# Patient Record
Sex: Female | Born: 1995 | Race: Black or African American | Hispanic: No | Marital: Single | Smoking: Never smoker
Health system: Southern US, Community
[De-identification: ages and names within clinical notes are randomized; demographics above are authoritative.]

## PROBLEM LIST (undated history)

## (undated) ENCOUNTER — Inpatient Hospital Stay (HOSPITAL_COMMUNITY): Payer: Self-pay

## (undated) DIAGNOSIS — Q8501 Neurofibromatosis, type 1: Secondary | ICD-10-CM

## (undated) HISTORY — DX: Neurofibromatosis, type 1: Q85.01

## (undated) HISTORY — PX: NO PAST SURGERIES: SHX2092

## (undated) HISTORY — PX: WISDOM TOOTH EXTRACTION: SHX21

---
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
## 2014-01-29 ENCOUNTER — Encounter: Payer: Self-pay | Admitting: Pediatrics

## 2014-01-29 ENCOUNTER — Ambulatory Visit (INDEPENDENT_AMBULATORY_CARE_PROVIDER_SITE_OTHER): Payer: Medicaid Other | Admitting: Pediatrics

## 2014-01-29 VITALS — BP 118/64 | Wt 94.2 lb

## 2014-01-29 DIAGNOSIS — Z3041 Encounter for surveillance of contraceptive pills: Secondary | ICD-10-CM

## 2014-01-29 DIAGNOSIS — Z23 Encounter for immunization: Secondary | ICD-10-CM

## 2014-01-29 DIAGNOSIS — Z3202 Encounter for pregnancy test, result negative: Secondary | ICD-10-CM

## 2014-01-29 LAB — POCT URINE PREGNANCY: Preg Test, Ur: NEGATIVE

## 2014-01-29 NOTE — Progress Notes (Signed)
History was provided by the patient.  Danielle Bradley is a 18 y.o. female who is here for follow up after starting OCPs.  Since starting approximately 2 months ago Danielle Bradley has noticed she has had a small increase in the frequency with which she gets headaches.  She has not had any mood swings, nausea, upset stomach.  Her periods have been normal without intermittent bleeding, she had worse cramps with her last period than normal.  She has not been sexually active, and does not smoke.  She sets an alarm in her phone to remind her to take her pill, hasn't missed thus far. Planning on starting some classes at [employer] then transferring to [employer] for education     The following portions of the patient's history were reviewed and updated as appropriate: allergies, current medications, past family history, past medical history, past social history, past surgical history and problem list.  Physical Exam:  BP 118/64  Wt 94 lb 3.2 oz (42.729 kg)    General:   alert and no distress     Skin:   innumerable freckles on face and arms  Eyes:   sclerae white  Nose: clear, no discharge  Lungs:  clear to auscultation bilaterally  Heart:   regular rate and rhythm, S1, S2 normal, no murmur, click, rub or gallop   Abdomen:  soft, non-tender; bowel sounds normal; no masses,  no organomegaly  Extremities:   extremities normal, atraumatic, no cyanosis or edema  Neuro:  normal without focal findings and muscle tone and strength normal and symmetric    Assessment/Plan:  1. Encounter for surveillance of contraceptive pills Currently doing well on trisprintec with minimal side effects and reports no problems with adherence.  Counseled to use condoms if she becomes sexually active and to abstain from smoking.  Advised to follow up as needed particularly if cramping does not improve.    2. Need for influenza vaccination Immunizations today: Counseled regarding all components vaccines and importance of giving. - Flu  Vaccine QUAD with presevative  - Follow-up visit in 3-6  months for follow up, or sooner as needed.    Shelly Rubenstein, MD  01/29/2014

## 2014-01-29 NOTE — Progress Notes (Signed)
I reviewed with the resident the medical history and the resident's findings on physical examination. I discussed with the resident the patient's diagnosis and concur with the treatment plan as documented in the resident's note.  Theadore Nan, MD Pediatrician  [facility]  01/29/2014 1:31 PM

## 2014-01-29 NOTE — Patient Instructions (Signed)
Continue to take your pill every day around the same time.  It is important to abstain from smoking and remember that birth control pills do not protect against sexually transmitted diseases so use a condom every time you have sex.   If you have any increased side effects return to the clinic.

## 2014-02-04 ENCOUNTER — Emergency Department (HOSPITAL_COMMUNITY)
Admission: EM | Admit: 2014-02-04 | Discharge: 2014-02-05 | Disposition: A | Discharge status: Home / Self Care | Payer: Medicaid Other | Attending: Emergency Medicine | Admitting: Emergency Medicine

## 2014-02-04 ENCOUNTER — Emergency Department (HOSPITAL_COMMUNITY): Payer: Medicaid Other

## 2014-02-04 DIAGNOSIS — Z79899 Other long term (current) drug therapy: Secondary | ICD-10-CM | POA: Insufficient documentation

## 2014-02-04 DIAGNOSIS — R0602 Shortness of breath: Secondary | ICD-10-CM | POA: Diagnosis not present

## 2014-02-04 DIAGNOSIS — R079 Chest pain, unspecified: Secondary | ICD-10-CM | POA: Diagnosis present

## 2014-02-04 DIAGNOSIS — R0789 Other chest pain: Secondary | ICD-10-CM | POA: Insufficient documentation

## 2014-02-04 LAB — COMPREHENSIVE METABOLIC PANEL
ALBUMIN: 3.5 g/dL (ref 3.5–5.2)
ALK PHOS: 41 U/L (ref 39–117)
ALT: 6 U/L (ref 0–35)
AST: 16 U/L (ref 0–37)
Anion gap: 13 (ref 5–15)
BUN: 9 mg/dL (ref 6–23)
CHLORIDE: 104 meq/L (ref 96–112)
CO2: 20 mEq/L (ref 19–32)
CREATININE: 0.64 mg/dL (ref 0.50–1.10)
Calcium: 8.6 mg/dL (ref 8.4–10.5)
GFR calc Af Amer: >90 mL/min (ref >90)
GFR calc non Af Amer: >90 mL/min (ref >90)
Glucose, Bld: 85 mg/dL (ref 70–99)
POTASSIUM: 4.3 meq/L (ref 3.7–5.3)
Sodium: 137 mEq/L (ref 137–147)
Total Protein: 7.7 g/dL (ref 6.0–8.3)

## 2014-02-04 LAB — CBC WITH DIFFERENTIAL/PLATELET
BASOS ABS: 0 10*3/uL (ref 0.0–0.1)
Basophils Relative: 0 % (ref 0–1)
EOS PCT: 1 % (ref 0–5)
Eosinophils Absolute: 0 10*3/uL (ref 0.0–0.7)
HEMATOCRIT: 33.9 % — AB (ref 36.0–46.0)
Hemoglobin: 10.7 g/dL — ABNORMAL LOW (ref 12.0–15.0)
LYMPHS ABS: 2.8 10*3/uL (ref 0.7–4.0)
LYMPHS PCT: 48 % — AB (ref 12–46)
MCH: 24.8 pg — ABNORMAL LOW (ref 26.0–34.0)
MCHC: 31.6 g/dL (ref 30.0–36.0)
MCV: 78.7 fL (ref 78.0–100.0)
MONO ABS: 0.5 10*3/uL (ref 0.1–1.0)
MONOS PCT: 8 % (ref 3–12)
Neutro Abs: 2.6 10*3/uL (ref 1.7–7.7)
Neutrophils Relative %: 43 % (ref 43–77)
Platelets: 311 10*3/uL (ref 150–400)
RBC: 4.31 MIL/uL (ref 3.87–5.11)
RDW: 14.2 % (ref 11.5–15.5)
WBC: 5.9 10*3/uL (ref 4.0–10.5)

## 2014-02-04 LAB — I-STAT TROPONIN, ED: TROPONIN I, POC: 0 ng/mL (ref 0.00–0.08)

## 2014-02-04 LAB — D-DIMER, QUANTITATIVE: D-Dimer, Quant: <0.27 ug/mL-FEU (ref 0.00–0.48)

## 2014-02-04 MED ORDER — NAPROXEN 500 MG PO TABS: 500.0000 mg | ORAL_TABLET | Freq: Two times a day (BID) | ORAL | Status: DC

## 2014-02-04 MED ORDER — KETOROLAC TROMETHAMINE 60 MG/2ML IM SOLN
60.0000 mg | Freq: Once | INTRAMUSCULAR | Status: AC
  Administered 2014-02-04: 60 mg via INTRAMUSCULAR
  Filled 2014-02-04: qty 2

## 2014-02-04 NOTE — Discharge Instructions (Signed)

## 2014-02-04 NOTE — ED Notes (Signed)
Pt reports chest pain in upper center of chest worse with deep breathes. Non smoker on BC. States no cough and head hurts.

## 2014-02-04 NOTE — ED Provider Notes (Signed)
CSN: [id number]     Arrival date & time 02/04/14  2002 History   First MD Initiated Contact with Patient 02/04/14 2219     Chief Complaint  Patient presents with  . Chest Pain     (Consider location/radiation/quality/duration/timing/severity/associated sxs/prior Treatment) Patient is a 18 y.o. female presenting with chest pain. The history is provided by the patient. No language interpreter was used.  Chest Pain Pain location:  Substernal area Pain quality: aching   Pain radiates to:  Does not radiate Pain radiates to the back: no   Pain severity:  Moderate Duration:  2 weeks Timing:  Intermittent Progression:  Waxing and waning Chronicity:  New Context: at rest   Relieved by:  Nothing Worsened by:  Deep breathing Ineffective treatments:  None tried Associated symptoms: shortness of breath   Associated symptoms: no abdominal pain, no anorexia, no back pain, no cough, no diaphoresis, no fatigue, no fever, no headache, no lower extremity edema, no nausea, no numbness, no palpitations, not vomiting and no weakness   Risk factors: not obese, not pregnant, no prior DVT/PE, no smoking and no surgery   Risk factors comment:  OCP use   No past medical history on file. No past surgical history on file. No family history on file. History  Substance Use Topics  . Smoking status: Never Smoker   . Smokeless tobacco: Not on file  . Alcohol Use: Not on file   OB History   Grav Para Term Preterm Abortions TAB SAB Ect Mult Living                 Review of Systems  Constitutional: Negative for fever, chills, diaphoresis, activity change, appetite change and fatigue.  HENT: Negative for congestion, facial swelling, rhinorrhea and sore throat.   Eyes: Negative for photophobia and discharge.  Respiratory: Positive for shortness of breath. Negative for cough and chest tightness.   Cardiovascular: Positive for chest pain. Negative for palpitations and leg swelling.  Gastrointestinal:  Negative for nausea, vomiting, abdominal pain, diarrhea and anorexia.  Endocrine: Negative for polydipsia and polyuria.  Genitourinary: Negative for dysuria, frequency, difficulty urinating and pelvic pain.  Musculoskeletal: Negative for arthralgias, back pain, neck pain and neck stiffness.  Skin: Negative for color change and wound.  Allergic/Immunologic: Negative for immunocompromised state.  Neurological: Negative for facial asymmetry, weakness, numbness and headaches.  Hematological: Does not bruise/bleed easily.  Psychiatric/Behavioral: Negative for confusion and agitation.      Allergies  Review of patient's allergies indicates no known allergies.  Home Medications   Prior to Admission medications   Medication Sig Start Date End Date Taking? Authorizing Provider  Norgestimate-Ethinyl Estradiol Triphasic (TRI-SPRINTEC) 0.18/0.215/0.25 MG-35 MCG tablet Take 1 tablet by mouth daily after lunch.   Yes Historical Provider, MD  OVER THE COUNTER MEDICATION Take 2 tablets by mouth 2 (two) times daily as needed (headache). Over the counter headache medication   Yes Historical Provider, MD  naproxen (NAPROSYN) 500 MG tablet Take 1 tablet (500 mg total) by mouth 2 (two) times daily. 02/04/14   Toy Cookey, MD   BP 112/76  Pulse 83  Temp(Src) 98.7 F (37.1 C) (Oral)  Resp 18  SpO2 100%  LMP 01/08/2014 Physical Exam  Constitutional: She is oriented to person, place, and time. She appears well-developed and well-nourished. No distress.  HENT:  Head: Normocephalic and atraumatic.  Mouth/Throat: No oropharyngeal exudate.  Eyes: Pupils are equal, round, and reactive to light.  Neck: Normal range of motion. Neck supple.  Cardiovascular: Normal rate, regular rhythm and normal heart sounds.  Exam reveals no gallop and no friction rub.   No murmur heard. Pulmonary/Chest: Effort normal and breath sounds normal. No respiratory distress. She has no wheezes. She has no rales.    Abdominal:  Soft. Bowel sounds are normal. She exhibits no distension and no mass. There is no tenderness. There is no rebound and no guarding.  Musculoskeletal: Normal range of motion. She exhibits no edema and no tenderness.  Neurological: She is alert and oriented to person, place, and time.  Skin: Skin is warm and dry.  Psychiatric: She has a normal mood and affect.    ED Course  Procedures (including critical care time) Labs Review Labs Reviewed  COMPREHENSIVE METABOLIC PANEL - Abnormal; Notable for the following:    Total Bilirubin <0.2 (*)    All other components within normal limits  CBC WITH DIFFERENTIAL - Abnormal; Notable for the following:    Hemoglobin 10.7 (*)    HCT 33.9 (*)    MCH 24.8 (*)    Lymphocytes Relative 48 (*)    All other components within normal limits  D-DIMER, QUANTITATIVE  I-STAT TROPOININ, ED    Imaging Review Dg Chest 2 View  02/04/2014   CLINICAL DATA:  Chest pain.  EXAM: CHEST  2 VIEW  COMPARISON:  None.  FINDINGS: Cardiopericardial silhouette within normal limits. Mediastinal contours normal. Trachea midline. No airspace disease or effusion.  IMPRESSION: No active cardiopulmonary disease.   Electronically Signed   By: Andreas Newport M.D.   On: 02/04/2014 21:48     EKG Interpretation   Date/Time:  Monday February 04 2014 20:15:43 EDT Ventricular Rate:  78 PR Interval:  116 QRS Duration: 74 QT Interval:  372 QTC Calculation: 424 R Axis:   61 Text Interpretation:  Normal sinus rhythm with sinus arrhythmia Normal ECG  No significant change since last tracing Confirmed by DOCHERTY  MD, MEGAN  ([id number]) on 02/04/2014 10:24:35 PM      MDM   Final diagnoses:  Atypical chest pain    Pt is a 18 y.o. female with Pmhx as above who presents with 2 weeks of intermittent chest pain, worse w/ deep breathing,  With mild assoc SOB.   No fever, chills, cough, or LE edema. VSS, pt in NAD. CP reproducible.  From triage, EKG nml, trop negative, CXR clear. Cannot use  PERC rule due to OCP use to r/o PE. D-dimer negative.  Doubt ACS, PE, PNA, PTX. Pain likely MSK or GI in nature. Will start trial of BID naproxen. Return precautions given for new or worsening symptoms including worsening pain, fever, inc SOB, leg pain/swelling.          Toy Cookey, MD 02/05/14 [phone]

## 2014-02-05 ENCOUNTER — Encounter (HOSPITAL_COMMUNITY): Payer: Self-pay | Admitting: Emergency Medicine

## 2014-03-26 ENCOUNTER — Ambulatory Visit (INDEPENDENT_AMBULATORY_CARE_PROVIDER_SITE_OTHER): Payer: Medicaid Other | Admitting: Pediatrics

## 2014-03-26 VITALS — BP 111/73 | Ht 61.02 in | Wt 92.2 lb

## 2014-03-26 DIAGNOSIS — Q8501 Neurofibromatosis, type 1: Secondary | ICD-10-CM | POA: Insufficient documentation

## 2014-03-26 NOTE — Progress Notes (Signed)
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   Renesha's mother, and two sisters have NF-1.  We initially met the 2 month old sister, Merrilyn Puma, as a newborn.  Merrilyn Puma has a diagnosis of NF-1.  We are also evaluating 36 year old sister, Kathlynn Grate, for the first time today.   There have not been palpable subcutaneous lesions. There is no history pseudoarthrosis. Hadja wears eyeglasses.  There is a history of a head CT in the past that was normal.   Menarche began at 18 years of age.  Jaritza takes an OCP.   DEVELOPMENT:  Tatjana  had typical cognitive and motor development. She has completed high school and will begin an Early Childhood Education program at [employer] in January.   REVIEW OF SYSTEMS:  There is no history of congenital heart malformation.  There is no history of seizures. There is no history of fractures.    BIRTH HISTORY: There was a vaginal delivery at term in [place], [place].  The birthweight was 6lb 12oz and length 19.5 inches.  FAMILY HISTORY: Ms. Jaquita Folds, the mother and family history informant, is 60 years old. She reported that she has numerous cafe-au-lait macules (CALs), several neurofibromas on her arms and knee and she wears glasses. She was raised in [place] where she received a diagnosis of Neurofibromatosis type 1.The mother reports that she herself had exacerbation of features.during each pregnancy. Ms. Berkley Harvey has a 9 year old daughter Catori and 61 month old Merrilyn Puma from a different partner. Barbar is small for her age, has CALs, freckling and glasses are prescribed.   Ms. Berkley Harvey  reported that her brother, his 71 year old daughter and 35 year old son have NF; they are known to genetics in [place], [place]. Another brother has epilepsy and no other apparent features of NF. Ms. Milagros Reap mother had an unknown abnormality of one leg/knee at birth that causes her pain and she also had three unexplained miscarriages. She has NF with features including hypertension and numerous neurofibromas. Ms. Milagros Reap 19 year old maternal grandmother has NF with numerous neurofibromas. Ms. Milagros Reap maternal great grandfather is deceased; he reportedly had NF as well with numerous neurofibromas. It is unclear if her older relatives received a clinical diagnosis of NF.   Ms. Berkley Harvey reported that she is [place] and [place]. Mr. Sherral Hammers is [place] and [place]. Consanguinity was denied. The reported family history is otherwise unremarkable for birth defects, recurrent miscarriages, cognitive and developmental delays, known genetic conditions including NF and other suggestive features of NF. We discussed the option of a genetics evaluation for other at-risk relatives if desired. A detailed family history is located in the genetics chart.  Physical Examination: well-appearing, interactive, thin  Weight 92.2 lb (< 1st percentile); Height: 155 cm (10th percentile), BMI 3rd percentile Blood pressure 111/73  Head/facies    Normally shaped head. Head circumference 54.6 cm (25th-50th centile)  Eyes Few Lisch nodules, PERRL, normal fundi  Ears Normally shaped.   Mouth Normal dentition  Neck No thyromegaly  Chest No murmur.   Abdomen No hepatomegaly.  No umbilical hernia.   Genitourinary  TANNER stage V  Musculoskeletal No contractures, no syndactyly. No scoliosis;   Neuro Normal tone and strength. No tremor, no ataxia.;no ptosis; No myotonia.   Skin/Integument Multiple cafe au lait macules on chest, back and abdomen. One large macule on back.  Marland Kitchen  Axillary  and inguinal freckling.    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 She has multiple features of NF-1 as does her mother, maternal half-sisters, Merrilyn Puma and [place] . There is a strong family history of NF-1 with other maternal relatives affected.  This diagnosis is confirmed today based on clinical criteria.   NIH Diagnostic Criteria for NF1 Clinical diagnosis based on presence of two of the following:  1. Six or more caf-au-lait macules over 5 mm in diameter in prepubertal individuals and over 32m in greatest diameter in postpubertal individuals. 2. Two or more neurofibromas of any type or one plexiform neurofibroma. 3. Freckling in the axillary or inguinal regions. 4. Two or more Lisch nodules (iris hamartomas). 5. Optic glioma. 6. A distinctive osseous lesion such as sphenoid dysplasia or thinning of long bone cortex, with or without pseudarthrosis. 7. First-degree relative (parent, sibling, or offspring) with NF-1 by the above criteria.  Genetic counselor, RDelon [place] and I have reviewed the clinical aspects of NF-1 with DKaysha her mother and sister. We reviewed the autosomal dominant mode of inheritance and recurrence risk for affected individuals. WE also reviewed the natural history of NF-1 and intrafamilial variability.    RECOMMENDATIONS:  We will be glad to see Dimple again at any time We encourage regular follow-up with the primary care pediatrician as well as eye specialist. The sibling, KMerrilyn Puma is scheduled for follow-up in August 2016.    PYork Grice M.D., Ph.D. Clinical Professor, Pediatrics and Medical Genetics  Cc: SClaudean Kinds MD

## 2014-09-17 ENCOUNTER — Encounter: Payer: Self-pay | Admitting: Licensed Clinical Social Worker

## 2014-09-17 ENCOUNTER — Ambulatory Visit: Payer: Medicaid Other | Admitting: Pediatrics

## 2014-12-31 ENCOUNTER — Encounter: Payer: Self-pay | Admitting: Pediatrics

## 2014-12-31 NOTE — Progress Notes (Signed)
Pre-Visit Planning  Danielle Bradley  is a 19 y.o. female referred by Danielle Minks, MD for birthcontrol.  Review of records sent: H/o neurofibromatosis, followed by Dr. Erik Bradley.   Previous discussion occurred regarding LARCs, pt declined at that point, wanting to start OCPs.  On OCPs at last f/u 01/29/2014 with Dr. Wonda Bradley.  ER visit 01/2014 for chest pain, DDImer neg.  Previous Psych Screenings?  yes, PHQ 9 12/13/2013  Clinical Staff Visit Tasks:   - Urine GC/CT due? yes - Psych Screenings Due? n/a - UHCG - Birth Control HOs - FS Hgb (Last hgb 01/2014 = 10.7)  Provider Visit Tasks: - Assess menstrual patterns - Review contraceptive options - Pertinent Labs? no

## 2015-01-01 ENCOUNTER — Ambulatory Visit (INDEPENDENT_AMBULATORY_CARE_PROVIDER_SITE_OTHER): Payer: Medicaid Other | Admitting: Pediatrics

## 2015-01-01 ENCOUNTER — Encounter: Payer: Self-pay | Admitting: Pediatrics

## 2015-01-01 ENCOUNTER — Encounter (INDEPENDENT_AMBULATORY_CARE_PROVIDER_SITE_OTHER): Payer: Self-pay

## 2015-01-01 VITALS — BP 125/70 | HR 92 | Ht 60.5 in | Wt 88.8 lb

## 2015-01-01 DIAGNOSIS — Z113 Encounter for screening for infections with a predominantly sexual mode of transmission: Secondary | ICD-10-CM

## 2015-01-01 DIAGNOSIS — Z3202 Encounter for pregnancy test, result negative: Secondary | ICD-10-CM

## 2015-01-01 DIAGNOSIS — Z30013 Encounter for initial prescription of injectable contraceptive: Secondary | ICD-10-CM

## 2015-01-01 DIAGNOSIS — Z23 Encounter for immunization: Secondary | ICD-10-CM

## 2015-01-01 DIAGNOSIS — Z1389 Encounter for screening for other disorder: Secondary | ICD-10-CM | POA: Diagnosis not present

## 2015-01-01 DIAGNOSIS — Z130 Encounter for screening for diseases of the blood and blood-forming organs and certain disorders involving the immune mechanism: Secondary | ICD-10-CM

## 2015-01-01 LAB — POCT HEMOGLOBIN: HEMOGLOBIN: 11.6 g/dL — AB (ref 12.2–16.2)

## 2015-01-01 LAB — POCT URINE PREGNANCY: Preg Test, Ur: NEGATIVE

## 2015-01-01 MED ORDER — MEDROXYPROGESTERONE ACETATE 150 MG/ML IM SUSP
150.0000 mg | Freq: Once | INTRAMUSCULAR | Status: AC
  Administered 2015-01-01: 150 mg via INTRAMUSCULAR

## 2015-01-01 NOTE — Progress Notes (Signed)
THIS RECORD MAY CONTAIN CONFIDENTIAL INFORMATION THAT SHOULD NOT BE RELEASED WITHOUT REVIEW OF THE SERVICE PROVIDER.  Adolescent Medicine Consultation Initial Visit Danielle Bradley  is a 19 y.o. female referred by Marijo File, MD here today for evaluation of birth control options.      Previsit planning completed:  no  Growth Chart Viewed? yes   History was provided by the patient.  PCP Confirmed?  yes  HPI:  She has been taking OCPs for the past year and says she doesn't have any more refills. Things have been going well with the OCPs but they are hard to remember to take.  She is interested in getting the shot because she can't forget about it "because I will have an appointment so I won't forget". Most of her friends get the shot or the implant.  She had first menses at 19yo and were irregular at first but have been regular since [age]yo. She usually bleeds for 7 days -- using both pads and tampons. She uses 3 super tampons on her heaviest days, which last about 5 days and then uses pads the rest of the time. She is currently sexually active with 1 partner, 3 lifetime partners and uses condoms as well 100% of the time.   Patient's last menstrual period was 11/17/2014 (exact date).  ROS:  Negative 10 point ROS except as mentioned above.   No Known Allergies   Medication List       This list is accurate as of: 01/01/15  3:25 PM.  Always use your most recent med list.               TRI-SPRINTEC 0.18/0.215/0.25 MG-35 MCG tablet  Generic drug:  Norgestimate-Ethinyl Estradiol Triphasic  Take 1 tablet by mouth daily after lunch.          Past Medical History:  Reviewed and updated?  yes Past Medical History  Diagnosis Date  . Neurofibromatosis, type 1     Family History: Reviewed and updated? yes Family History  Problem Relation Age of Onset  . Neurofibromatosis Mother   . Neurofibromatosis Sister     Social History: Lives with:  patient, mother and sister 29yo  and 2yo Parental relations:  Dad stays in [place] and parents get along alright Siblings:  sisters: 2 sisters: 16yo and 2yo Friends/Peers:  has many healthy friendships School:  is in Copy[employer], [employer] and is doing well; Going to go to [employer] first studying early childhood education Future Plans:  college Nutrition/Eating Behaviors:  If she is hungry she will eat, but isn't hungry a lot of the time. Granddad told her she is a picky eater but doesn't think she needs to gain or lose weight. Exercise:  Walks a lot with her family; Going to start going to the [employer] with her family soon. Sports:  none Screen Time:  > 2 hours-counseling provided Sleep:  no sleep issues  Confidentiality was discussed with the patient and if applicable, with caregiver as well.  Patient's personal or confidential phone number: [phone] My Chart Activated?   no   Tobacco?  no Drugs/ETOH?  no Partner preference?  female Sexually Active?  Yes; 3 lifetime partners, 1 partner in past 6 months.  Pregnancy Prevention:  birth control pills, reviewed condoms & plan B Safe at home, in school & in relationships?  Yes Safe to self?  Yes  Guns in the home?  no  The following portions of the patient's history were reviewed and updated as appropriate:  allergies, current medications, past family history, past medical history, past social history, past surgical history and problem list.  Physical Exam:  Filed Vitals:   01/01/15 1419  BP: 125/70  Pulse: 92  Height: 5' 0.5" (1.537 m)  Weight: 88 lb 12.8 oz (40.279 kg)   BP 125/70 mmHg  Pulse 92  Ht 5' 0.5" (1.537 m)  Wt 88 lb 12.8 oz (40.279 kg)  BMI 17.05 kg/m2  LMP 11/17/2014 (Exact Date) Body mass index: body mass index is 17.05 kg/(m^2). Facility age limit for growth percentiles is 20 years.  Physical Exam  Constitutional: She appears well-developed. No distress.  thin  HENT:  Head: Normocephalic and atraumatic.  Eyes: Conjunctivae are normal. Right eye  exhibits no discharge. Left eye exhibits no discharge.  Neck: Normal range of motion. Neck supple.  Cardiovascular: Normal rate, regular rhythm and normal heart sounds.   No murmur heard. Pulmonary/Chest: Effort normal and breath sounds normal. No respiratory distress. She has no wheezes. She has no rales.  Abdominal: Soft. Bowel sounds are normal. She exhibits no distension. There is no tenderness.  Musculoskeletal: Normal range of motion. She exhibits no edema or tenderness.  Neurological: She is alert. No cranial nerve deficit.  Skin: Skin is warm.  Multiple freckles noted on face and bilateral axillary freckling.  Psychiatric: She has a normal mood and affect. Her behavior is normal. Judgment and thought content normal.     Assessment/Plan: 1. Encounter for initial prescription of injectable contraceptive - medroxyPROGESTERone (DEPO-PROVERA) injection 150 mg; Inject 1 mL (150 mg total) into the muscle once. - Will see in 3 months to administer next depo-provera injection and continue discussion of LARC (patient nervous at this visit to place nexplanon but is thinking about it for the future)  2. Routine screening for STI (sexually transmitted infection) - GC/chlamydia probe amp, urine - WIll follow-up results and call in treatment if necessary - Patient's cell phone number listed above in note  4. Pregnancy examination or test, negative result - POCT urine pregnancy -- negative at today's visit  5. Screening for iron deficiency anemia - POCT hemoglobin -- low at 12.2, will follow up and patient given instructions to call if bleeding amount increases while on depo-provera  6. Need for vaccination - HPV 9-valent vaccine,Recombinat   Follow-up:   Return in about 3 months (around 04/03/2015) for Follow up of ?LARC placement and next Depo shot with Dr. Marina Goodell.   Medical decision-making:  > 45 minutes spent, more than 50% of appointment was spent discussing diagnosis and management  of symptoms

## 2015-01-01 NOTE — Patient Instructions (Signed)
We will see you back in 3 months for your next depo-provera injection and to discuss using a longer acting birth control at that time. Please call the office if you have any abnormal or heavier bleeding, golf-ball sized blood clots coming out of your vagina so we can see you before your 3 month follow-up appointment.  Thanks, Dr. Franky Macho

## 2015-01-02 ENCOUNTER — Telehealth: Payer: Self-pay | Admitting: *Deleted

## 2015-01-02 LAB — GC/CHLAMYDIA PROBE AMP, URINE
Chlamydia, Swab/Urine, PCR: NEGATIVE
GC Probe Amp, Urine: NEGATIVE

## 2015-01-02 NOTE — Progress Notes (Signed)
Attending Co-Signature.  I saw and evaluated the patient, performing the key elements of the service.  I developed the management plan that is described in the resident's note, and I agree with the content.  Islay Polanco [place], MD Adolescent Medicine Specialist 

## 2015-01-02 NOTE — Telephone Encounter (Signed)
Unable to reach pt or mother. Only phone number provided has been disconnected. Pt was given call back number at appt 01/01/15, as well as depo calendar at d/c.

## 2015-01-07 ENCOUNTER — Ambulatory Visit (INDEPENDENT_AMBULATORY_CARE_PROVIDER_SITE_OTHER): Payer: Medicaid Other | Admitting: Pediatrics

## 2015-01-07 ENCOUNTER — Encounter: Payer: Self-pay | Admitting: Pediatrics

## 2015-01-07 VITALS — BP 120/80 | Ht 60.75 in | Wt 89.0 lb

## 2015-01-07 DIAGNOSIS — Z68.41 Body mass index (BMI) pediatric, less than 5th percentile for age: Secondary | ICD-10-CM | POA: Diagnosis not present

## 2015-01-07 DIAGNOSIS — Q8501 Neurofibromatosis, type 1: Secondary | ICD-10-CM

## 2015-01-07 DIAGNOSIS — Z113 Encounter for screening for infections with a predominantly sexual mode of transmission: Secondary | ICD-10-CM | POA: Diagnosis not present

## 2015-01-07 DIAGNOSIS — R636 Underweight: Secondary | ICD-10-CM | POA: Diagnosis not present

## 2015-01-07 DIAGNOSIS — Z0289 Encounter for other administrative examinations: Secondary | ICD-10-CM

## 2015-01-07 NOTE — Progress Notes (Signed)
Routine Well-Adolescent Visit  PCP: Venia Minks, MD   Cell: [phone]   History was provided by the patient. Confidentiality discussed.  Danielle Bradley is a 19 y.o. female who is here for well viist..  Current concerns: No specific concerns today. 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 Danielle Bradley reports to be doing well. No new lesions, no h/o seizures, no h/o congnitive delays. She wears glasses & has an upcoming appt with Opthal. She has lost weight from last year- about 3 lbs. She does not report to be dieting or trying to lose weight. She does exercise daily by walking with her mom. She is not concerned about her weight. She reports to eat healthy & does not skip meals.  Adolescent Assessment:  Confidentiality was discussed with the patient and if applicable, with caregiver as well.  Home and Environment:  Lives with: lives at home with mom & sibs- 27 y/o & 2 y/o Parental relations: good Friends/Peers: has many friends Nutrition/Eating Behaviors: Eats healthy most of the time- picky with some vegetables. Usually does not skip meals. Sports/Exercise:  Walks daily with mom. Not involved in sports.  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 School History: Did well in school, plans to continue college after community college Work: Looking for a job  Patient reports being comfortable and safe at school and at home? Yes  Smoking: no Secondhand smoke exposure? no Drugs/EtOH: denies  Menstruation:   Menarche: post menarchal last menses if female:1 month back. Had depo shot 01/01/15. Thinking about LARC   Sexuality:heterosexual Sexually active? yes - 1 partner for the past 2 years  sexual partners in last year:1 contraception use: Depo-Provera Last STI Screening: last week.  Violence/Abuse: Denies Mood: Suicidality and Depression: denies Weapons: none  Screenings: The  patient completed the Rapid Assessment for Adolescent Preventive Services screening questionnaire and the following topics were identified as risk factors and discussed: healthy eating, exercise, condom use and screen time  In addition, the following topics were discussed as part of anticipatory guidance tobacco use, marijuana use and drug use.  PHQ-9 completed and results indicated no issues  Physical Exam:  BP 120/80 mmHg  Ht 5' 0.75" (1.543 m)  Wt 89 lb (40.37 kg)  BMI 16.96 kg/m2  LMP 11/17/2014 Facility age limit for growth percentiles is 20 years.  General Appearance:   alert, oriented, no acute distress  HENT: Normocephalic, no obvious abnormality, conjunctiva clear  Mouth:   Normal appearing teeth, no obvious discoloration, dental caries, or dental caps  Neck:   Supple; thyroid: no enlargement, symmetric, no tenderness/mass/nodules  Lungs:   Clear to auscultation bilaterally, normal work of breathing  Heart:   Regular rate and rhythm, S1 and S2 normal, no murmurs;   Abdomen:   Soft, non-tender, no mass, or organomegaly  GU Tanner stage 5. Normal external genitalia  Musculoskeletal:   Tone and strength strong and symmetrical, all extremities               Lymphatic:   No cervical adenopathy  Skin/Hair/Nails:   Multiple cafe au lait spots on the body, large lesion on the back. Axillary freckling. No subcutaneous nodules palpable,  Neurologic:   Strength, gait, and coordination normal and age-appropriate    Assessment/Plan: 19 yr old F for well visit NF-1 Short Stature & Underweight  Discussed nutrition in detail. Pt is not interested in nutrition counseling currently. . Adolescent counseling given.  Pt had depo last week. Next depo in 12 weeks. Screening labs- HIV, RPR, TFT requested.  Transition to family/internal medicine discussed  - Follow-up visit in 1 year for next visit, or sooner as needed.   Venia Minks, MD

## 2015-01-07 NOTE — Patient Instructions (Signed)
Health Maintenance - 18-21 Years Old SCHOOL PERFORMANCE After high school, you may attend college or technical or vocational school, enroll in the military, or enter the workforce. PHYSICAL, SOCIAL, AND EMOTIONAL DEVELOPMENT  One hour of regular physical activity daily is recommended. Continue to participate in sports.  Develop your own interests and consider community service or volunteerism.  Make decisions about college and work plans.  Throughout these years, you should assume responsibility for your own health care. Increasing independence is important for you.  You may be exploring your sexual identity. Understand that you should never be in a situation that makes you feel uncomfortable, and tell your partner if you do not want to engage in sexual activity.  Body image may become important to you. Be mindful that eating disorders can develop at this time. Talk to your parents or other caregivers if you have concerns about body image, weight gain, or losing weight.  You may notice mood disturbances, depression, anxiety, attention problems, or trouble with alcohol. Talk to your health care provider if you have concerns about mental illness.  Set limits for yourself and talk with your parents or other caregivers about independent decision making.  Handle conflict without physical violence.  Avoid loud noises which may impair hearing.  Limit television and computer time to 2 hours each day. Individuals who engage in excessive inactivity are more likely to become overweight. RECOMMENDED IMMUNIZATIONS  Influenza vaccine.  All adults should be immunized every year.  All adults, including pregnant women and people with hives-only allergy to eggs, can receive the inactivated influenza (IIV) vaccine.  Adults aged 18-49 years can receive the recombinant influenza (RIV) vaccine. The RIV vaccine does not contain any egg protein.  Tetanus, diphtheria, and acellular pertussis (Td, Tdap)  vaccine.  Pregnant women should receive 1 dose of Tdap vaccine during each pregnancy. The dose should be obtained regardless of the length of time since the last dose. Immunization is preferred during the 27th to 36th week of gestation.  An adult who has not previously received Tdap or who does not know his or her vaccine status should receive 1 dose of Tdap. This initial dose should be followed by tetanus and diphtheria toxoids (Td) booster doses every 10 years.  Adults with an unknown or incomplete history of completing a 3-dose immunization series with Td-containing vaccines should begin or complete a primary immunization series including a Tdap dose.  Adults should receive a Td booster every 10 years.  Varicella vaccine.  An adult without evidence of immunity to varicella should receive 2 doses or a second dose if he or she has previously received 1 dose.  Pregnant females who do not have evidence of immunity should receive the first dose after pregnancy. This first dose should be obtained before leaving the health care facility. The second dose should be obtained 4-8 weeks after the first dose.  Human papillomavirus (HPV) vaccine.  Females aged 13-26 years who have not received the vaccine previously should obtain the 3-dose series.  The vaccine is not recommended for pregnant females. However, pregnancy testing is not needed before receiving a dose. If a female is found to be pregnant after receiving a dose, no treatment is needed. In that case, the remaining doses should be delayed until after the pregnancy.  Males aged 13-21 years who have not received the vaccine previously should receive the 3-dose series. Males aged 22-26 years may be immunized.  Immunization is recommended through the age of 26 years for any   female who has sex with males and did not get any or all doses earlier.  Immunization is recommended for any person with an immunocompromised condition through the age of 26  years if he or she did not get any or all doses earlier.  During the 3-dose series, the second dose should be obtained 4-8 weeks after the first dose. The third dose should be obtained 24 weeks after the first dose and 16 weeks after the second dose.  Measles, mumps, and rubella (MMR) vaccine.  Adults born in 1957 or later should have 1 or more doses of MMR vaccine unless there is a contraindication to the vaccine or there is laboratory evidence of immunity to each of the three diseases.  A routine second dose of MMR vaccine should be obtained at least 28 days after the first dose for students attending postsecondary schools, health care workers, and international travelers.  For females of childbearing age, rubella immunity should be determined. If there is no evidence of immunity, females who are not pregnant should be vaccinated. If there is no evidence of immunity, females who are pregnant should delay immunization until after pregnancy.  Pneumococcal 13-valent conjugate (PCV13) vaccine.  When indicated, a person who is uncertain of his or her immunization history and has no record of immunization should receive the PCV13 vaccine.  An adult aged 19 years or older who has certain medical conditions and has not been previously immunized should receive 1 dose of PCV13 vaccine. This PCV13 should be followed with a dose of pneumococcal polysaccharide (PPSV23) vaccine. The PPSV23 vaccine dose should be obtained at least 8 weeks after the dose of PCV13 vaccine.  An adult aged 19 years or older who has certain medical conditions and previously received 1 or more doses of PPSV23 vaccine should receive 1 dose of PCV13. The PCV13 vaccine dose should be obtained 1 or more years after the last PPSV23 vaccine dose.  Pneumococcal polysaccharide (PPSV23) vaccine.  When PCV13 is also indicated, PCV13 should be obtained first.  An adult younger than age 65 years who has certain medical conditions should be  immunized.  Any person who resides in a long-term care facility should be immunized.  An adult smoker should be immunized.  People with an immunocompromised condition and certain other conditions should receive both PCV13 and PPSV23 vaccines.  People with human immunodeficiency virus (HIV) infection should be immunized as soon as possible after diagnosis.  Immunization during chemotherapy or radiation therapy should be avoided.  Routine use of PPSV23 vaccine is not recommended for American Indians, [place] Natives, or people younger than 65 years unless there are medical conditions that require PPSV23 vaccine.  When indicated, people who have unknown immunization and have no record of immunization should receive PPSV23 vaccine.  One-time revaccination 5 years after the first dose of PPSV23 is recommended for people aged 19-64 years who have chronic kidney failure, nephrotic syndrome, asplenia, or immunocompromised conditions.  Meningococcal vaccine.  Adults with asplenia or persistent complement component deficiencies should receive 2 doses of quadrivalent meningococcal conjugate (MenACWY-D) vaccine. The doses should be obtained at least 2 months apart.  Microbiologists working with certain meningococcal bacteria, military recruits, people at risk during an outbreak, and people who travel to or live in countries with a high rate of meningitis should be immunized.  A first-year college student up through age 21 years who is living in a residence hall should receive a dose if he or she did not receive a dose on   or after his or her 16th birthday.  Adults who have certain high-risk conditions should receive one or more doses of vaccine.  Hepatitis A vaccine.  Adults who wish to be protected from this disease, have certain high-risk conditions, work with hepatitis A-infected animals, work in hepatitis A research labs, or travel to or work in countries with a high rate of hepatitis A should be  immunized.  Adults who were previously unvaccinated and who anticipate close contact with an international adoptee during the first 60 days after arrival in the [place] from a country with a high rate of hepatitis A should be immunized.  Hepatitis B vaccine.  Adults who wish to be protected from this disease, have certain high-risk conditions, may be exposed to blood or other infectious body fluids, are household contacts or sex partners of hepatitis B positive people, are clients or workers in certain care facilities, or travel to or work in countries with a high rate of hepatitis B should be immunized.  Haemophilus influenzae type b (Hib) vaccine.  A previously unvaccinated person with asplenia or sickle cell disease or having a scheduled splenectomy should receive 1 dose of Hib vaccine.  Regardless of previous immunization, a recipient of a hematopoietic stem cell transplant should receive a 3-dose series 6-12 months after his or her successful transplant.  Hib vaccine is not recommended for adults with HIV infection. TESTING  Annual screening for vision and hearing problems is recommended. Vision should be screened at least once between 18-21 years of age.  You may be screened for anemia or tuberculosis.  You should have a blood test to check for high cholesterol.  You should be screened for alcohol and drug use.  If you are sexually active, you may be screened for sexually transmitted infections (STIs), pregnancy, or HIV. You should be screened for STIs if:  Your sexual activity has changed since the last screening test, and you are at an increased risk for chlamydia or gonorrhea. Ask your health care provider if you are at risk.  If you are at an increased risk for hepatitis B, you should be screened for this virus. You are considered at high risk for hepatitis B if you:  Were born in a country where hepatitis B occurs often. Talk with your health care provider about which  countries are considered high risk.  Have parents who were born in a high-risk country and have not received a shot to protect against hepatitis B (hepatitis B vaccine).  Have HIV or AIDS.  Use needles to inject street drugs.  Live with or have sex with someone who has hepatitis B.  Are a man who has sex with other men (MSM).  Get hemodialysis treatment.  Take certain medicines for conditions like cancer, organ transplantation, or autoimmune conditions. NUTRITION   You should:  Have three servings of low-fat milk and dairy products daily. If you do not drink milk or consume dairy products, you should eat calcium-enriched foods, such as juice, bread, or cereal. Dark, leafy greens or canned fish are alternate sources of calcium.  Drink plenty of water. Fruit juice should be limited to 8-12 oz (240-360 mL) each day. Sugary beverages and sodas should be avoided.  Avoid eating foods high in fat, salt, or sugar, such as chips, candy, and cookies.  Avoid fast foods and limit eating out at restaurants.  Try not to skip meals, especially breakfast. You should eat a variety of vegetables, fruits, and lean meats.  Eat meals   together as a family whenever possible. ORAL HEALTH Brush your teeth twice a day and floss at least once a day. You should have two dental exams a year.  SKIN CARE You should wear sunscreen when out in the sun. TALK TO SOMEONE ABOUT:  Precautions against pregnancy, contraception, and sexually transmitted infections.  Taking a prescription medicine daily to prevent HIV infection if you are at risk of being infected with HIV. This is called preexposure prophylaxis (PrEP). You are at risk if you:  Are a female who has sex with other males (MSM).  Are heterosexual and sexually active with more than one partner.  Take drugs by injection.  Are sexually active with a partner who has HIV.  Whether you are at high risk of being infected with HIV. If you choose to begin  PrEP, you should first be tested for HIV. You should then be tested every 3 months for as long as you are taking PrEP.  Drug, tobacco, and alcohol use among your friends or at friends' homes. Smoking tobacco or marijuana and taking drugs have health consequences and may impact your brain development.  Appropriate use of over-the-counter or prescription medicines.  Driving guidelines and riding with friends.  The risks of drinking and driving or boating. Call someone if you have been drinking or using drugs and need a ride. WHAT'S NEXT? Visit your pediatrician or family physician once a year. By young adulthood, you should transition from your pediatrician to a family physician or internal medicine specialist. If you are a female and are sexually active, you may want to begin annual physical exams with a gynecologist. Document Released: 07/22/2006 Document Revised: 05/01/2013 Document Reviewed: 08/11/2006 Exit[employer] Patient Information 2015 [employer]. This information is not intended to replace advice given to you by your health care provider. Make sure you discuss any questions you have with your health care provider.  

## 2015-01-08 LAB — T4, FREE: Free T4: 0.81 ng/dL (ref 0.80–1.80)

## 2015-01-08 LAB — RPR

## 2015-01-08 LAB — GC/CHLAMYDIA PROBE AMP, URINE
CHLAMYDIA, SWAB/URINE, PCR: NEGATIVE
GC PROBE AMP, URINE: NEGATIVE

## 2015-01-08 LAB — TSH: TSH: 0.876 u[IU]/mL (ref 0.350–4.500)

## 2015-01-08 LAB — HIV ANTIBODY (ROUTINE TESTING W REFLEX): HIV: NONREACTIVE

## 2015-02-21 ENCOUNTER — Emergency Department (HOSPITAL_COMMUNITY): Payer: Medicaid Other

## 2015-02-21 ENCOUNTER — Encounter (HOSPITAL_COMMUNITY): Payer: Self-pay | Admitting: Family Medicine

## 2015-02-21 ENCOUNTER — Emergency Department (HOSPITAL_COMMUNITY)
Admission: EM | Admit: 2015-02-21 | Discharge: 2015-02-21 | Disposition: A | Discharge status: Home / Self Care | Payer: Medicaid Other | Attending: Emergency Medicine | Admitting: Emergency Medicine

## 2015-02-21 DIAGNOSIS — Z793 Long term (current) use of hormonal contraceptives: Secondary | ICD-10-CM | POA: Diagnosis not present

## 2015-02-21 DIAGNOSIS — R51 Headache: Secondary | ICD-10-CM | POA: Diagnosis present

## 2015-02-21 DIAGNOSIS — R079 Chest pain, unspecified: Secondary | ICD-10-CM | POA: Insufficient documentation

## 2015-02-21 DIAGNOSIS — R519 Headache, unspecified: Secondary | ICD-10-CM

## 2015-02-21 LAB — BASIC METABOLIC PANEL
Anion gap: 7 (ref 5–15)
BUN: 11 mg/dL (ref 6–20)
CHLORIDE: 107 mmol/L (ref 101–111)
CO2: 26 mmol/L (ref 22–32)
Calcium: 9.3 mg/dL (ref 8.9–10.3)
Creatinine, Ser: 0.75 mg/dL (ref 0.44–1.00)
GFR calc Af Amer: >60 mL/min (ref >60)
GLUCOSE: 97 mg/dL (ref 65–99)
POTASSIUM: 3.9 mmol/L (ref 3.5–5.1)
Sodium: 140 mmol/L (ref 135–145)

## 2015-02-21 LAB — CBC
HEMATOCRIT: 37 % (ref 36.0–46.0)
HEMOGLOBIN: 11.7 g/dL — AB (ref 12.0–15.0)
MCH: 25.4 pg — AB (ref 26.0–34.0)
MCHC: 31.6 g/dL (ref 30.0–36.0)
MCV: 80.4 fL (ref 78.0–100.0)
Platelets: 303 10*3/uL (ref 150–400)
RBC: 4.6 MIL/uL (ref 3.87–5.11)
RDW: 14.9 % (ref 11.5–15.5)
WBC: 5.5 10*3/uL (ref 4.0–10.5)

## 2015-02-21 LAB — I-STAT TROPONIN, ED: Troponin i, poc: 0 ng/mL (ref 0.00–0.08)

## 2015-02-21 MED ORDER — DEXAMETHASONE 0.5 MG PO TABS
1.0000 mg | ORAL_TABLET | Freq: Once | ORAL | Status: AC
  Administered 2015-02-21: 1 mg via ORAL
  Filled 2015-02-21: qty 2

## 2015-02-21 MED ORDER — METOCLOPRAMIDE HCL 10 MG PO TABS
5.0000 mg | ORAL_TABLET | Freq: Once | ORAL | Status: AC
  Administered 2015-02-21: 5 mg via ORAL
  Filled 2015-02-21: qty 1

## 2015-02-21 MED ORDER — DIPHENHYDRAMINE HCL 25 MG PO CAPS
25.0000 mg | ORAL_CAPSULE | Freq: Once | ORAL | Status: AC
Start: 2015-02-21 — End: 2015-02-21
  Administered 2015-02-21: 25 mg via ORAL
  Filled 2015-02-21: qty 1

## 2015-02-21 NOTE — Discharge Instructions (Signed)

## 2015-02-21 NOTE — ED Notes (Signed)
Pt presents via POV with c/o headache and CP that began late last night. Pt reports history of the same, but is concerned because her maternal aunts (x2) and maternal grandmother have had brain aneurysms.  She appears in NAD, is A&Ox3.

## 2015-02-21 NOTE — ED Provider Notes (Signed)
CSN: [id number]     Arrival date & time 02/21/15  4098 History   First MD Initiated Contact with Patient 02/21/15 [phone]     Chief Complaint  Patient presents with  . Headache  . Chest Pain     (Consider location/radiation/quality/duration/timing/severity/associated sxs/prior Treatment) HPI Danielle Bradley is a 19 y.o. female with PMH significant for migraines who presents with gradual onset bilateral and frontal headache as well as chest pain that began last night.  Patient states this headache is different and more severe than previous headaches.  She took ASA, but it did not help. Denies visual disturbances, fevers, chills, numbness/tingling/weakness, cough, sore throat, SOB, N/V/D, bloody stools, or urinary symptoms.  Family history of brain aneurysms, and patient expressed concern.   Past Medical History  Diagnosis Date  . Neurofibromatosis, type 1 (HCC)    History reviewed. No pertinent past surgical history. Family History  Problem Relation Age of Onset  . Neurofibromatosis Mother   . Neurofibromatosis Sister    Social History  Substance Use Topics  . Smoking status: Never Smoker   . Smokeless tobacco: Never Used  . Alcohol Use: No   OB History    No data available     Review of Systems All other systems negative unless otherwise stated in HPI    Allergies  Review of patient's allergies indicates no known allergies.  Home Medications   Prior to Admission medications   Medication Sig Start Date End Date Taking? Authorizing Provider  medroxyPROGESTERone (DEPO-PROVERA) 150 MG/ML injection Inject 150 mg into the muscle every 3 (three) months.   Yes Historical Provider, MD   BP 116/77 mmHg  Pulse 80  Temp(Src) 98.6 F (37 C)  Resp 19  SpO2 100%  LMP 02/06/2015 Physical Exam  Constitutional: She is oriented to person, place, and time. She appears well-developed and well-nourished.  HENT:  Head: Normocephalic and atraumatic.  Mouth/Throat: Oropharynx is  clear and moist.  Eyes: Pupils are equal, round, and reactive to light.  Neck: Normal range of motion. Neck supple.  Cardiovascular: Normal rate and regular rhythm.   No murmur heard. Pulmonary/Chest: Effort normal and breath sounds normal. No respiratory distress. She has no wheezes. She has no rales.  Abdominal: Soft. Bowel sounds are normal. She exhibits no distension. There is no tenderness.  Musculoskeletal: Normal range of motion.  Lymphadenopathy:    She has no cervical adenopathy.  Neurological: She is alert and oriented to person, place, and time.  Mental Status:   AOx3 Cranial Nerves:  I-not tested  II-PERRLA  III, IV, VI-EOMs intact  V-temporal and masseter strength intact  VII-symmetrical facial movements intact, no facial droop  VIII-hearing grossly intact bilaterally  IX, X-gag intact  XI-strength of sternomastoid and trapezius muscles 5/5  XII-tongue midline Motor:   Good muscle bulk and tone  Strength 5/5 bilaterally in upper and lower extremities   Cerebellar--RAMs, finger to nose intact  Romberg--maintains balance with eyes closed  Casual and tandem gait normal without ataxia  No pronator drift Sensory:  Intact in upper and lower extremities   Skin: Skin is warm and dry.  Psychiatric: She has a normal mood and affect. Her behavior is normal.    ED Course  Procedures (including critical care time) Labs Review Labs Reviewed  CBC - Abnormal; Notable for the following:    Hemoglobin 11.7 (*)    MCH 25.4 (*)    All other components within normal limits  BASIC METABOLIC PANEL  I-STAT TROPOININ, ED  Imaging Review Dg Chest 2 View  02/21/2015  CLINICAL DATA:  Mid chest pain. EXAM: CHEST - 2 VIEW COMPARISON:  None. FINDINGS: The heart size and mediastinal contours are within normal limits. Both lungs are clear. The visualized skeletal structures are unremarkable. IMPRESSION: No active disease. Electronically Signed   By: Marin Robertshristopher  Mattern M.D.   On:  02/21/2015 09:28   Ct Head Wo Contrast  02/21/2015  CLINICAL DATA:  Severe frontal headache beginning yesterday. EXAM: CT HEAD WITHOUT CONTRAST TECHNIQUE: Contiguous axial images were obtained from the base of the skull through the vertex without intravenous contrast. COMPARISON:  None. FINDINGS: No evidence of intracranial hemorrhage, brain edema, or other signs of acute infarction. No evidence of intracranial mass lesion or mass effect. No abnormal extraaxial fluid collections identified. Ventricles are normal in size. No skull abnormality identified. IMPRESSION: Negative noncontrast head CT. Electronically Signed   By: Myles RosenthalJohn  Stahl M.D.   On: 02/21/2015 11:16   I have personally reviewed and evaluated these images and lab results as part of my medical decision-making.   EKG Interpretation None      MDM   Final diagnoses:  Nonintractable headache, unspecified chronicity pattern, unspecified headache type    Patient presents with gradual onset headache that is not per normal headaches.  Endorses CP as well.  On exam, no focal neurological abnormalities.  Will obtain basic labs, troponin, CXR, and head CT.  Labs unremarkable.  Troponin negative.  EKG shows NSR.  CT head shows no acute abnormalities.  CXR shows no active disease.  Doubt SAH.  Doubt infarct.  Doubt mass.  Patient's headache much improved after migraine cocktail.  Patient stable for discharge.  Discussed return precautions.  Patient agrees and acknowledges the above plan.  Follow up with PCP.   Cheri FowlerKayla Mikka Kissner, PA-C 02/21/15 1147  Lyndal Pulleyaniel Knott, MD 02/23/15 [id number]

## 2015-03-10 ENCOUNTER — Ambulatory Visit: Payer: Medicaid Other | Admitting: Pediatrics

## 2015-03-24 ENCOUNTER — Ambulatory Visit: Payer: Medicaid Other | Admitting: Pediatrics

## 2015-03-24 ENCOUNTER — Ambulatory Visit: Payer: Medicaid Other | Admitting: *Deleted

## 2015-04-23 ENCOUNTER — Telehealth: Payer: Self-pay

## 2015-04-23 NOTE — Telephone Encounter (Signed)
Pt called today to schedule her Depo, pt missed October and November appt. Pt doesn't remember when was the last time she had her Depo.

## 2015-04-23 NOTE — Telephone Encounter (Signed)
Pt had Depo last on 8-24. She will need to be schedule with Provider to restart Depo.

## 2015-04-24 NOTE — Telephone Encounter (Signed)
Pt is scheduled with C Millican 12/16.

## 2015-04-25 ENCOUNTER — Encounter: Payer: Self-pay | Admitting: Family

## 2015-04-25 ENCOUNTER — Encounter (HOSPITAL_COMMUNITY): Payer: Self-pay | Admitting: *Deleted

## 2015-04-25 ENCOUNTER — Inpatient Hospital Stay (HOSPITAL_COMMUNITY): Payer: Medicaid Other

## 2015-04-25 ENCOUNTER — Inpatient Hospital Stay (HOSPITAL_COMMUNITY)
Admission: AD | Admit: 2015-04-25 | Discharge: 2015-04-25 | Disposition: A | Discharge status: Home / Self Care | Payer: Medicaid Other | Source: Ambulatory Visit | Attending: Obstetrics & Gynecology | Admitting: Obstetrics & Gynecology

## 2015-04-25 ENCOUNTER — Ambulatory Visit (INDEPENDENT_AMBULATORY_CARE_PROVIDER_SITE_OTHER): Payer: Medicaid Other | Admitting: Family

## 2015-04-25 VITALS — BP 116/73 | HR 86 | Wt 87.1 lb

## 2015-04-25 DIAGNOSIS — O4691 Antepartum hemorrhage, unspecified, first trimester: Secondary | ICD-10-CM | POA: Diagnosis not present

## 2015-04-25 DIAGNOSIS — R109 Unspecified abdominal pain: Secondary | ICD-10-CM | POA: Diagnosis not present

## 2015-04-25 DIAGNOSIS — O3680X0 Pregnancy with inconclusive fetal viability, not applicable or unspecified: Secondary | ICD-10-CM

## 2015-04-25 DIAGNOSIS — Z3201 Encounter for pregnancy test, result positive: Secondary | ICD-10-CM

## 2015-04-25 DIAGNOSIS — R636 Underweight: Secondary | ICD-10-CM | POA: Diagnosis not present

## 2015-04-25 DIAGNOSIS — O209 Hemorrhage in early pregnancy, unspecified: Secondary | ICD-10-CM | POA: Diagnosis not present

## 2015-04-25 DIAGNOSIS — Q8501 Neurofibromatosis, type 1: Secondary | ICD-10-CM

## 2015-04-25 DIAGNOSIS — Z3A01 Less than 8 weeks gestation of pregnancy: Secondary | ICD-10-CM | POA: Diagnosis not present

## 2015-04-25 LAB — CBC WITH DIFFERENTIAL/PLATELET
BASOS ABS: 0 10*3/uL (ref 0.0–0.1)
BASOS PCT: 1 %
EOS PCT: 1 %
Eosinophils Absolute: 0.1 10*3/uL (ref 0.0–0.7)
HCT: 36.6 % (ref 36.0–46.0)
Hemoglobin: 11.7 g/dL — ABNORMAL LOW (ref 12.0–15.0)
Lymphocytes Relative: 33 %
Lymphs Abs: 2 10*3/uL (ref 0.7–4.0)
MCH: 25.7 pg — ABNORMAL LOW (ref 26.0–34.0)
MCHC: 32 g/dL (ref 30.0–36.0)
MCV: 80.4 fL (ref 78.0–100.0)
MONO ABS: 0.6 10*3/uL (ref 0.1–1.0)
MONOS PCT: 9 %
Neutro Abs: 3.4 10*3/uL (ref 1.7–7.7)
Neutrophils Relative %: 56 %
PLATELETS: 311 10*3/uL (ref 150–400)
RBC: 4.55 MIL/uL (ref 3.87–5.11)
RDW: 15.2 % (ref 11.5–15.5)
WBC: 6.1 10*3/uL (ref 4.0–10.5)

## 2015-04-25 LAB — WET PREP, GENITAL
Clue Cells Wet Prep HPF POC: NONE SEEN
SPERM: NONE SEEN
Trich, Wet Prep: NONE SEEN
YEAST WET PREP: NONE SEEN

## 2015-04-25 LAB — HCG, QUANTITATIVE, PREGNANCY: hCG, Beta Chain, Quant, S: 152 m[IU]/mL — ABNORMAL HIGH (ref <5)

## 2015-04-25 LAB — ABO/RH: ABO/RH(D): O POS

## 2015-04-25 LAB — POCT URINE PREGNANCY: PREG TEST UR: POSITIVE — AB

## 2015-04-25 NOTE — Discharge Instructions (Signed)

## 2015-04-25 NOTE — Progress Notes (Signed)
Patient ID: Danielle Bradley, female   DOB: 05-Mar-1996, 19 y.o.   MRN: [id number] Pre-Visit Planning  Danielle Bradley  is a 19 y.o. female referred by Venia MinksSIMHA,SHRUTI VIJAYA, MD.   [facility] 01[facility]15 for depo.   Previous Psych Screenings? no  Treatment plan at last visit included depo.   Clinical Staff Visit Tasks:   - Urine GC/CT due? No, negative screen 01/01/15 - Psych Screenings Due? no - uhcg  Provider Visit Tasks: - Depo, update sexual hx   [facility] - Pertinent Labs? no

## 2015-04-25 NOTE — Progress Notes (Signed)
THIS RECORD MAY CONTAIN CONFIDENTIAL INFORMATION THAT SHOULD NOT BE RELEASED WITHOUT REVIEW OF THE SERVICE PROVIDER.  Adolescent Medicine Consultation Follow-Up Visit Danielle Bradley  is a 19 y.o. female referred by Marijo FileSimha, Shruti V, MD here today for follow-up.    Previsit planning completed:  Yes Patient ID: Danielle Bradley, female   DOB: 05-02-1996, 19 y.o.   MRN: [id number] Pre-Visit Planning  Danielle Bradley  is a 19 y.o. female referred by Venia MinksSIMHA,SHRUTI VIJAYA, MD.   Last seen in [facility] on 01/01/15 for depo.   Previous Psych Screenings? no  Treatment plan at last visit included depo.   Clinical Staff Visit Tasks:   - Urine GC/CT due? No, negative screen 01/01/15 - Psych Screenings Due? no - uhcg  Provider Visit Tasks: - Depo, update sexual hx   [facility] - Pertinent Labs? no    Growth Chart Viewed? no   History was provided by the patient.  PCP Confirmed?  Yes, Tobey BrideShruti Simha, MD   My Chart Activated?   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  Her last depo injection was 01/01/15 and she is sexually active.  Starting having vaginal bleeding on Tuesday, still having some bleeding. She has been cramping since about 2-3 days before Tuesday. Still continues to have intermittent cramping mid-lower abdomen, described as longer and more painful than her usual cycle cramping.  No other complaint or concerns today.   Patient's last menstrual period was 04/22/2015. No Known Allergies Current Outpatient Prescriptions on File Prior to Visit  Medication Sig Dispense Refill  . medroxyPROGESTERone (DEPO-PROVERA) 150 MG/ML injection Inject 150 mg into the muscle every 3 (three) months.     No current facility-administered medications on file prior to visit.    Confidentiality was discussed with the patient and if applicable, with caregiver as well.  Patient's personal or  confidential phone number:  Tobacco?  no Drugs/ETOH?  no Partner preference?  female Sexually Active?  yes   Pregnancy Prevention:  depo-provera (LATE FOR INJECTION), reviewed condoms & plan B Safe at home, in school & in relationships?  Yes Safe to self?  Yes   The following portions of the patient's history were reviewed and updated as appropriate: allergies, current medications, past family history, past medical history, past social history, past surgical history and problem list.  Review of Systems  Constitutional: Negative.   HENT: Negative.   Eyes: Negative.   Respiratory: Negative.   Cardiovascular: Negative.   Gastrointestinal: Positive for abdominal pain.  Genitourinary: Negative.   Musculoskeletal: Negative.   Skin: Negative.   Neurological: Negative.   Endo/Heme/Allergies: Negative.   Psychiatric/Behavioral: Negative.     Physical Exam:  Filed Vitals:   04/25/15 1605  BP: 116/73  Pulse: 86  Weight: 87 lb 2 oz (39.52 kg)   BP 116/73 mmHg  Pulse 86  Wt 87 lb 2 oz (39.52 kg)  LMP 04/22/2015 Body mass index: body mass index is 16.6 kg/(m^2). Facility age limit for growth percentiles is 20 years.  Physical Exam  Constitutional: She is oriented to person, place, and time. She appears well-developed. No distress.  Underweight  HENT:  Head: Normocephalic and atraumatic.  Eyes: EOM are normal. Pupils are equal, round, and reactive to light. No scleral icterus.  Neck: Normal range of motion. Neck supple. No thyromegaly present.  Cardiovascular: Normal rate, regular rhythm, normal heart sounds and intact distal pulses.   No murmur  heard. Pulmonary/Chest: Effort normal and breath sounds normal.  Abdominal: Soft. She exhibits no distension. There is no guarding.  Musculoskeletal: Normal range of motion. She exhibits no edema.  Lymphadenopathy:    She has no cervical adenopathy.  Neurological: She is alert and oriented to person, place, and time. No cranial nerve  deficit.  Skin: Skin is warm and dry. No rash noted.  Psychiatric: She has a normal mood and affect. Her behavior is normal. Judgment and thought content normal.  Vitals reviewed.    Assessment/Plan: 1. Encounter for pregnancy test, result positive Positive test results reviewed with patient, then with mother per patient's request.  - POCT urine pregnancy  2. Abdominal cramping L>R; due to positive UHCG with bleeding and cramping, will recommend further evaluation today to r/o ectopic or SAB. Call to MAU to advise patient is coming. Mother able to drive patient there. No acute distress.   3. Neurofibromatosis, type I (von Recklinghausen's disease) (HCC)  4. Underweight  Follow-up:  No Follow-up on file. Will call Monday for follow up pending.  Medical decision-making:   > 15 minutes spent, more than 50% of appointment was spent discussing diagnosis and management of symptoms

## 2015-04-25 NOTE — MAU Provider Note (Signed)
History     CSN: [id number]  Arrival date and time: 04/25/15 1642   First Provider Initiated Contact with Patient 04/25/15 1718      Chief Complaint  Patient presents with  . Abdominal Cramping  . Vaginal Bleeding   HPI Danielle Bradley 19 y.o. G1P0  presents to MAU complaining of abdominal pain and vaginal bleeding.  Her bleeding started 3 days ago and has been like a period.  She went to her doctor today to get her depo shot thinking she was just on her regular period, but when pregnancy test came back positive, she was told to come here.  Her pain is mid lower abdfomen and is similar to regular cramps.  She notes they are 7-8/10 and it comes and goes.  She has used tylenol and ibuprofen with minimal help.    It is worse with lying down.  Denies nausea, vomiting, fever, dysuria.   OB History    Gravida Para Term Preterm AB TAB SAB Ectopic Multiple Living   1         0      Past Medical History  Diagnosis Date  . Neurofibromatosis, type 1 [facility])     Past Surgical History  Procedure Laterality Date  . No past surgeries      Family History  Problem Relation Age of Onset  . Neurofibromatosis Mother   . Neurofibromatosis Sister     Social History  Substance Use Topics  . Smoking status: Never Smoker   . Smokeless tobacco: Never Used  . Alcohol Use: No    Allergies: No Known Allergies  Prescriptions prior to admission  Medication Sig Dispense Refill Last Dose  . medroxyPROGESTERone (DEPO-PROVERA) 150 MG/ML injection Inject 150 mg into the muscle every 3 (three) months.   01/2015    ROS Pertinent ROS in HPI.  All other systems are negative.   Physical Exam   Blood pressure 122/70, pulse 85, temperature 98.6 F (37 C), temperature source Oral, resp. rate 18, height 5' 0.5" (1.537 m), weight 86 lb 8 oz (39.236 kg), last menstrual period 03/07/2015.  Physical Exam  Constitutional: She is oriented to person, place, and time. She appears well-developed and  well-nourished. No distress.  HENT:  Head: Normocephalic and atraumatic.  Eyes: Conjunctivae and EOM are normal.  Neck: Normal range of motion. Neck supple.  Cardiovascular: Normal rate and normal heart sounds.   Respiratory: Effort normal and breath sounds normal. No respiratory distress.  GI: Soft. Bowel sounds are normal. She exhibits no distension. There is no tenderness. There is no rebound and no guarding.  Genitourinary:  Dark red blood present in vault.  Small amt of clotting noted.  No CMT.  No adnexal mass or tenderness  Musculoskeletal: Normal range of motion. She exhibits no edema.  Neurological: She is alert and oriented to person, place, and time.  Skin: Skin is warm and dry.  Psychiatric: She has a normal mood and affect. Her behavior is normal.   Results for orders placed or performed during the hospital encounter of 04/25/15 (from the past 24 hour(s))  CBC with Differential/Platelet     Status: Abnormal   Collection Time: 04/25/15  5:40 PM  Result Value Ref Range   WBC 6.1 4.0 - 10.5 K/uL   RBC 4.55 3.87 - 5.11 MIL/uL   Hemoglobin 11.7 (L) 12.0 - 15.0 g/dL   HCT 78.2 95.6 - 21.3 %   MCV 80.4 78.0 - 100.0 fL   MCH 25.7 (L) 26.0 -  34.0 pg   MCHC 32.0 30.0 - 36.0 g/dL   RDW 16.115.2 09.611.5 - 04.515.5 %   Platelets 311 150 - 400 K/uL   Neutrophils Relative % 56 %   Neutro Abs 3.4 1.7 - 7.7 K/uL   Lymphocytes Relative 33 %   Lymphs Abs 2.0 0.7 - 4.0 K/uL   Monocytes Relative 9 %   Monocytes Absolute 0.6 0.1 - 1.0 K/uL   Eosinophils Relative 1 %   Eosinophils Absolute 0.1 0.0 - 0.7 K/uL   Basophils Relative 1 %   Basophils Absolute 0.0 0.0 - 0.1 K/uL  ABO/Rh     Status: None (Preliminary result)   Collection Time: 04/25/15  5:40 PM  Result Value Ref Range   ABO/RH(D) O POS   hCG, quantitative, pregnancy     Status: Abnormal   Collection Time: 04/25/15  5:40 PM  Result Value Ref Range   hCG, Beta Chain, Quant, S 152 (H) <5 mIU/mL  Wet prep, genital     Status: Abnormal    Collection Time: 04/25/15  5:49 PM  Result Value Ref Range   Yeast Wet Prep HPF POC NONE SEEN NONE SEEN   Trich, Wet Prep NONE SEEN NONE SEEN   Clue Cells Wet Prep HPF POC NONE SEEN NONE SEEN   WBC, Wet Prep HPF POC MODERATE (A) NONE SEEN   Sperm NONE SEEN     MAU Course  Procedures  MDM Ectopic workup ordered to eval bleeding in early pregnancy.   Rh + Hemodynamically stable No evidence for infection U/S does not visualize pregnancy but no notable ectopic yet  Assessment and Plan  A:  1. Pregnancy of unknown anatomic location   2. Vaginal bleeding in pregnancy, first trimester    P: Discharge to home Ectopic precautions Return to MAU in 48 hours for follow up quant Patient may return to MAU as needed or if her condition were to change or worsen   Bertram DenverKaren E Teague Clark 04/25/2015, 5:18 PM

## 2015-04-25 NOTE — MAU Note (Signed)
Urine in lab 

## 2015-04-25 NOTE — MAU Note (Signed)
Started cramping on Sunday, thought period was going to start.  Started bleeding on Tues, cramping got worse. Went in to get depo, had + preg test.

## 2015-04-26 LAB — RPR: RPR: NONREACTIVE

## 2015-04-26 LAB — HIV ANTIBODY (ROUTINE TESTING W REFLEX): HIV SCREEN 4TH GENERATION: NONREACTIVE

## 2015-04-28 LAB — GC/CHLAMYDIA PROBE AMP (~~LOC~~) NOT AT ARMC
Chlamydia: NEGATIVE
NEISSERIA GONORRHEA: NEGATIVE

## 2015-04-29 ENCOUNTER — Telehealth: Payer: Self-pay | Admitting: Family

## 2015-04-29 ENCOUNTER — Encounter: Payer: Self-pay | Admitting: Family

## 2015-04-29 NOTE — Telephone Encounter (Signed)
TC to check on patient; mother answered and she stated she is still bleeding and they are returning to MAU for follow-up lab work tonight. Mom had no further questions and was appreciative of call.

## 2015-05-01 ENCOUNTER — Inpatient Hospital Stay (HOSPITAL_COMMUNITY)
Admission: AD | Admit: 2015-05-01 | Discharge: 2015-05-01 | Disposition: A | Discharge status: Home / Self Care | Payer: Medicaid Other | Source: Ambulatory Visit | Attending: Family Medicine | Admitting: Family Medicine

## 2015-05-01 DIAGNOSIS — O039 Complete or unspecified spontaneous abortion without complication: Secondary | ICD-10-CM | POA: Insufficient documentation

## 2015-05-01 DIAGNOSIS — O209 Hemorrhage in early pregnancy, unspecified: Secondary | ICD-10-CM | POA: Diagnosis present

## 2015-05-01 LAB — HCG, QUANTITATIVE, PREGNANCY: hCG, Beta Chain, Quant, S: 16 m[IU]/mL — ABNORMAL HIGH (ref <5)

## 2015-05-01 NOTE — MAU Note (Signed)
Pt presents to MAU for follow up quant. Denies any pain or bleeding

## 2015-05-01 NOTE — MAU Provider Note (Signed)
  History     CSN: [id number]  Arrival date and time: 05/01/15 1359   None     Chief Complaint  Patient presents with  . Labs Only   HPI Danielle Bradley 19 y.o. G1P0 was seen on 12/16 for vaginal bleeding with a positive pregnancy test.  She had gone to clinic that day for a depo shot thinking she was on her period, although she had a positive pregnancy test there.  She was sent here.  She continued to bleed for one additional day until 12/17.  She was advised to return here fore HCG on 12/18 but did not follow through.  A phone call was placed 12/20 and pt still did not come until today.  Today she denies vaginal bleeding, abdominal pain, fever, other concerns.   OB History    Gravida Para Term Preterm AB TAB SAB Ectopic Multiple Living   1         0      Past Medical History  Diagnosis Date  . Neurofibromatosis, type 1 [facility](HCC)     Past Surgical History  Procedure Laterality Date  . No past surgeries      Family History  Problem Relation Age of Onset  . Neurofibromatosis Mother   . Neurofibromatosis Sister     Social History  Substance Use Topics  . Smoking status: Never Smoker   . Smokeless tobacco: Never Used  . Alcohol Use: No    Allergies: No Known Allergies  Prescriptions prior to admission  Medication Sig Dispense Refill Last Dose  . acetaminophen (TYLENOL) 325 MG tablet Take 650 mg by mouth every 6 (six) hours as needed for moderate pain.   04/24/2015 at Unknown time    ROS Pertinent ROS in HPI.  All other systems are negative.   Physical Exam   Blood pressure 123/71, pulse 73, temperature 98 F (36.7 C), resp. rate 16, last menstrual period 03/07/2015.  Physical Exam  Constitutional: She is oriented to person, place, and time. She appears well-developed and well-nourished. No distress.  HENT:  Head: Normocephalic and atraumatic.  Eyes: Conjunctivae and EOM are normal.  Respiratory: Effort normal. No respiratory distress.  Neurological: She is  alert and oriented to person, place, and time.  Skin: Skin is warm and dry.  Psychiatric: She has a normal mood and affect. Her behavior is normal.    MAU Course  Procedures  MDM HCG on 12/16: 154 HCG today: 16  Assessment and Plan  A: SAB  P: Discharge to home Pt promises to follow up with clinic for depo injection.   Return to MAU for additional problems, abdominal pain/vaginal bleeding Patient may return to MAU as needed or if her condition were to change or worsen   Bertram DenverKaren E Teague Clark 05/01/2015, 3:39 PM

## 2015-05-11 NOTE — L&D Delivery Note (Signed)
Patient is 20 y.o. G1P0 3132w0d admitted SOL   Delivery Note At 12:59 PM a viable female was delivered via Vaginal, Vacuum (Extractor) (Presentation: cephailic; LOA  ).  APGAR: 7, 9; weight 8 lb 6 oz (3799 g).   Placenta status: with trailing membranes .  Cord:  3 vessel, nuchal x1. Without complications.  Cord pH: pending.  Anesthesia: epidural  Episiotomy: None Lacerations: 2nd degree extending into L vaginal Suture Repair: 3.0 monocryl Est. Blood Loss (mL):  300  Mom to postpartum.  Baby to Couplet care / Skin to Skin.  Leland HerElsia J Yoo 01/29/2016, 1:56 PM    Upon arrival patient was complete and pushing. She pushed with poor maternal effort due to exhaustion so a vacuum was applied to assist maternal effort to deliver a healthy baby boy. Vacuum was applied in front of posterior fontanelle with appropriate suction, and with the next contraction and good maternal effort (x4 pushes), baby's head was delivered to perineum and suction was released. Baby delivered without difficulty through nuchal cord x1, was noted to have good tone and place on maternal abdomen for oral suctioning, drying and stimulation. Delayed cord clamping performed. Placenta delivered intact with 3V cord. Vaginal canal and perineum was inspected and repaired; hemostatic. Pitocin was started and uterus massaged until bleeding slowed. Counts of sharps, instruments, and lap pads were all correct.   Leland HerElsia J Yoo, DO PGY-1 9/21/20172:03 PM

## 2015-06-03 ENCOUNTER — Inpatient Hospital Stay (HOSPITAL_COMMUNITY)
Admission: AD | Admit: 2015-06-03 | Discharge: 2015-06-03 | Disposition: A | Discharge status: Home / Self Care | Payer: Medicaid Other | Source: Ambulatory Visit | Attending: Obstetrics and Gynecology | Admitting: Obstetrics and Gynecology

## 2015-06-03 ENCOUNTER — Encounter (HOSPITAL_COMMUNITY): Payer: Self-pay | Admitting: Student

## 2015-06-03 DIAGNOSIS — Z3201 Encounter for pregnancy test, result positive: Secondary | ICD-10-CM | POA: Insufficient documentation

## 2015-06-03 LAB — HCG, QUANTITATIVE, PREGNANCY: hCG, Beta Chain, Quant, S: 12305 m[IU]/mL — ABNORMAL HIGH (ref <5)

## 2015-06-03 NOTE — Discharge Instructions (Signed)
First Trimester of Pregnancy  The first trimester of pregnancy is from week 1 until the end of week 12 (months 1 through 3). A week after a sperm fertilizes an egg, the egg will implant on the wall of the uterus. This embryo will begin to develop into a baby. Genes from you and your partner are forming the baby. The female genes determine whether the baby is a boy or a girl. At 6-8 weeks, the eyes and face are formed, and the heartbeat can be seen on ultrasound. At the end of 12 weeks, all the baby's organs are formed.   Now that you are pregnant, you will want to do everything you can to have a healthy baby. Two of the most important things are to get good prenatal care and to follow your health care provider's instructions. Prenatal care is all the medical care you receive before the baby's birth. This care will help prevent, find, and treat any problems during the pregnancy and childbirth.  BODY CHANGES  Your body goes through many changes during pregnancy. The changes vary from woman to woman.   · You may gain or lose a couple of pounds at first.  · You may feel sick to your stomach (nauseous) and throw up (vomit). If the vomiting is uncontrollable, call your health care provider.  · You may tire easily.  · You may develop headaches that can be relieved by medicines approved by your health care provider.  · You may urinate more often. Painful urination may mean you have a bladder infection.  · You may develop heartburn as a result of your pregnancy.  · You may develop constipation because certain hormones are causing the muscles that push waste through your intestines to slow down.  · You may develop hemorrhoids or swollen, bulging veins (varicose veins).  · Your breasts may begin to grow larger and become tender. Your nipples may stick out more, and the tissue that surrounds them (areola) may become darker.  · Your gums may bleed and may be sensitive to brushing and flossing.   · Dark spots or blotches (chloasma, mask of pregnancy) may develop on your face. This will likely fade after the baby is born.  · Your menstrual periods will stop.  · You may have a loss of appetite.  · You may develop cravings for certain kinds of food.  · You may have changes in your emotions from day to day, such as being excited to be pregnant or being concerned that something may go wrong with the pregnancy and baby.  · You may have more vivid and strange dreams.  · You may have changes in your hair. These can include thickening of your hair, rapid growth, and changes in texture. Some women also have hair loss during or after pregnancy, or hair that feels dry or thin. Your hair will most likely return to normal after your baby is born.  WHAT TO EXPECT AT YOUR PRENATAL VISITS  During a routine prenatal visit:  · You will be weighed to make sure you and the baby are growing normally.  · Your blood pressure will be taken.  · Your abdomen will be measured to track your baby's growth.  · The fetal heartbeat will be listened to starting around week 10 or 12 of your pregnancy.  · Test results from any previous visits will be discussed.  Your health care provider may ask you:  · How you are feeling.  · If you   including cigarettes, chewing tobacco, and electronic cigarettes. °· If you have any questions. °Other tests that may be performed during your first trimester include: °· Blood tests to find your blood type and to check for the presence of any previous infections. They will also be used to check for low iron levels (anemia) and Rh antibodies. Later in the pregnancy, blood tests for diabetes will be done along with other tests if problems develop. °· Urine tests to check for infections, diabetes, or protein in the urine. °· An ultrasound to confirm the proper growth  and development of the baby. °· An amniocentesis to check for possible genetic problems. °· Fetal screens for spina bifida and Down syndrome. °· You may need other tests to make sure you and the baby are doing well. °· HIV (human immunodeficiency virus) testing. Routine prenatal testing includes screening for HIV, unless you choose not to have this test. °HOME CARE INSTRUCTIONS  °Medicines °· Follow your health care provider's instructions regarding medicine use. Specific medicines may be either safe or unsafe to take during pregnancy. °· Take your prenatal vitamins as directed. °· If you develop constipation, try taking a stool softener if your health care provider approves. °Diet °· Eat regular, well-balanced meals. Choose a variety of foods, such as meat or vegetable-based protein, fish, milk and low-fat dairy products, vegetables, fruits, and whole grain breads and cereals. Your health care provider will help you determine the amount of weight gain that is right for you. °· Avoid raw meat and uncooked cheese. These carry germs that can cause birth defects in the baby. °· Eating four or five small meals rather than three large meals a day may help relieve nausea and vomiting. If you start to feel nauseous, eating a few soda crackers can be helpful. Drinking liquids between meals instead of during meals also seems to help nausea and vomiting. °· If you develop constipation, eat more high-fiber foods, such as fresh vegetables or fruit and whole grains. Drink enough fluids to keep your urine clear or pale yellow. °Activity and Exercise °· Exercise only as directed by your health care provider. Exercising will help you: °¨ Control your weight. °¨ Stay in shape. °¨ Be prepared for labor and delivery. °· Experiencing pain or cramping in the lower abdomen or low back is a good sign that you should stop exercising. Check with your health care provider before continuing normal exercises. °· Try to avoid standing for long  periods of time. Move your legs often if you must stand in one place for a long time. °· Avoid heavy lifting. °· Wear low-heeled shoes, and practice good posture. °· You may continue to have sex unless your health care provider directs you otherwise. °Relief of Pain or Discomfort °· Wear a good support bra for breast tenderness.   °· Take warm sitz baths to soothe any pain or discomfort caused by hemorrhoids. Use hemorrhoid cream if your health care provider approves.   °· Rest with your legs elevated if you have leg cramps or low back pain. °· If you develop varicose veins in your legs, wear support hose. Elevate your feet for 15 minutes, 3-4 times a day. Limit salt in your diet. °Prenatal Care °· Schedule your prenatal visits by the twelfth week of pregnancy. They are usually scheduled monthly at first, then more often in the last 2 months before delivery. °· Write down your questions. Take them to your prenatal visits. °· Keep all your prenatal visits as directed by your   health care provider. Safety  Wear your seat belt at all times when driving.  Make a list of emergency phone numbers, including numbers for family, friends, the hospital, and police and fire departments. General Tips  Ask your health care provider for a referral to a local prenatal education class. Begin classes no later than at the beginning of month 6 of your pregnancy.  Ask for help if you have counseling or nutritional needs during pregnancy. Your health care provider can offer advice or refer you to specialists for help with various needs.  Do not use hot tubs, steam rooms, or saunas.  Do not douche or use tampons or scented sanitary pads.  Do not cross your legs for long periods of time.  Avoid cat litter boxes and soil used by cats. These carry germs that can cause birth defects in the baby and possibly loss of the fetus by miscarriage or stillbirth.  Avoid all smoking, herbs, alcohol, and medicines not prescribed by  your health care provider. Chemicals in these affect the formation and growth of the baby.  Do not use any tobacco products, including cigarettes, chewing tobacco, and electronic cigarettes. If you need help quitting, ask your health care provider. You may receive counseling support and other resources to help you quit.  Schedule a dentist appointment. At home, brush your teeth with a soft toothbrush and be gentle when you floss. SEEK MEDICAL CARE IF:   You have dizziness.  You have mild pelvic cramps, pelvic pressure, or nagging pain in the abdominal area.  You have persistent nausea, vomiting, or diarrhea.  You have a bad smelling vaginal discharge.  You have pain with urination.  You notice increased swelling in your face, hands, legs, or ankles. SEEK IMMEDIATE MEDICAL CARE IF:   You have a fever.  You are leaking fluid from your vagina.  You have spotting or bleeding from your vagina.  You have severe abdominal cramping or pain.  You have rapid weight gain or loss.  You vomit blood or material that looks like coffee grounds.  You are exposed to [place] measles and have never had them.  You are exposed to fifth disease or chickenpox.  You develop a severe headache.  You have shortness of breath.  You have any kind of trauma, such as from a fall or a car accident.   This information is not intended to replace advice given to you by your health care provider. Make sure you discuss any questions you have with your health care provider.   Document Released: 04/20/2001 Document Revised: 05/17/2014 Document Reviewed: 03/06/2013 Elsevier Interactive Patient Education 2016 Elsevier Inc.    [place] [facility]   [facility] Ceo      Phone: [phone]  [facility]     Phone: [phone]  [facility] [place]  Phone: [phone]  [facility] [place]  Phone: [phone]  [facility]    Phone: [phone]   [facility]    Phone: [phone]  [facility]    Phone: [phone]  [facility]    Phone: [phone]  [facility]    Phone: [phone]  [facility] Phone: [phone]   [facility]  Phone: [phone]  [facility]    Phone: [phone]  [facility]   Phone: [phone]  [facility]      Phone:  [phone] ° °[facility]    Phone: [phone] ° °[facility]     Phone: [phone] ° °

## 2015-06-03 NOTE — MAU Note (Signed)
Pt reports she was seen here in December and told she was having a miscarriage and her levels dropped to 16, but she has had several positive preg test over the last week. Denies pain or bleeding.

## 2015-06-03 NOTE — MAU Provider Note (Signed)
S:  Danielle Bradley is a 20 y.o. G1P0 at [redacted]w[redacted]d wks here for confirmation of pregnancy. Patient's last menstrual period was 03/07/2015.Marland Kitchen Patient had SAB on 12/22 after BHCG dropped from 152 to 16.  Denies any vaginal bleeding or abdominal pain.  Plans to get prenatal care at undecided.  O:  Past Medical History  Diagnosis Date  . Neurofibromatosis, type 1 (HCC)     Family History  Problem Relation Age of Onset  . Neurofibromatosis Mother   . Neurofibromatosis Sister      Ceasar Mons Vitals:   06/03/15 1935  BP: 134/71  Pulse: 99  Temp: 98.6 F (37 C)  Resp: 18   General:  A&OX3 with no signs of acute distress. She appears well-developed and well-nourished. No distress.  Neck: Normal range of motion.  Pulmonary/Chest: Effort normal. No respiratory distress.  Musculoskeletal: Normal range of motion.  Neurological: She is alert and oriented to person, place, and time.  Skin: Skin is warm and dry.   A: BHCG ordered d/t recent miscarriage.  1. Positive blood pregnancy test      P: Explained benefits of taking prenatal vitamins w/folic acid early in pregnancy. Begin prenatal care. Reviewed warning signs of pregnancy.  Judeth Horn, NP

## 2015-06-24 LAB — OB RESULTS CONSOLE GC/CHLAMYDIA
CHLAMYDIA, DNA PROBE: NEGATIVE
GC PROBE AMP, GENITAL: NEGATIVE

## 2015-06-24 LAB — SICKLE CELL SCREEN: SICKLE CELL SCREEN: NEGATIVE

## 2015-06-24 LAB — OB RESULTS CONSOLE ABO/RH: RH Type: POSITIVE

## 2015-06-24 LAB — OB RESULTS CONSOLE HEPATITIS B SURFACE ANTIGEN: Hepatitis B Surface Ag: NEGATIVE

## 2015-06-24 LAB — OB RESULTS CONSOLE HGB/HCT, BLOOD
HCT: 34 %
Hemoglobin: 10.9 g/dL

## 2015-06-24 LAB — OB RESULTS CONSOLE HIV ANTIBODY (ROUTINE TESTING): HIV: NONREACTIVE

## 2015-06-24 LAB — OB RESULTS CONSOLE ANTIBODY SCREEN: ANTIBODY SCREEN: NEGATIVE

## 2015-06-24 LAB — OB RESULTS CONSOLE RPR: RPR: NONREACTIVE

## 2015-06-24 LAB — OB RESULTS CONSOLE RUBELLA ANTIBODY, IGM: RUBELLA: IMMUNE

## 2015-06-24 LAB — OB RESULTS CONSOLE PLATELET COUNT: PLATELETS: 345 10*3/uL

## 2015-06-25 LAB — CYTOLOGY - PAP: PAP SMEAR: NEGATIVE

## 2015-11-18 ENCOUNTER — Encounter: Payer: Self-pay | Admitting: *Deleted

## 2015-11-19 ENCOUNTER — Encounter: Payer: Self-pay | Admitting: Obstetrics and Gynecology

## 2015-11-19 ENCOUNTER — Ambulatory Visit (INDEPENDENT_AMBULATORY_CARE_PROVIDER_SITE_OTHER): Payer: Medicaid Other | Admitting: Obstetrics and Gynecology

## 2015-11-19 VITALS — BP 110/67 | HR 90 | Temp 98.1°F | Wt 108.9 lb

## 2015-11-19 DIAGNOSIS — Z3480 Encounter for supervision of other normal pregnancy, unspecified trimester: Secondary | ICD-10-CM | POA: Insufficient documentation

## 2015-11-19 DIAGNOSIS — Z1389 Encounter for screening for other disorder: Secondary | ICD-10-CM

## 2015-11-19 DIAGNOSIS — Z331 Pregnant state, incidental: Secondary | ICD-10-CM | POA: Diagnosis not present

## 2015-11-19 DIAGNOSIS — O4440 Low lying placenta NOS or without hemorrhage, unspecified trimester: Secondary | ICD-10-CM | POA: Diagnosis not present

## 2015-11-19 DIAGNOSIS — Z3402 Encounter for supervision of normal first pregnancy, second trimester: Secondary | ICD-10-CM

## 2015-11-19 DIAGNOSIS — Q8501 Neurofibromatosis, type 1: Secondary | ICD-10-CM

## 2015-11-19 LAB — POCT URINALYSIS DIPSTICK
Bilirubin, UA: NEGATIVE
GLUCOSE UA: NEGATIVE
Ketones, UA: NEGATIVE
NITRITE UA: NEGATIVE
PROTEIN UA: NEGATIVE
SPEC GRAV UA: 1.02
UROBILINOGEN UA: 0.2
pH, UA: 6

## 2015-11-19 NOTE — Addendum Note (Signed)
Addended by: [place] BingPICKENS, Shuntia [place] on: 11/19/2015 04:47 PM   Modules accepted: Orders

## 2015-11-19 NOTE — Progress Notes (Addendum)
Prenatal Visit Note Date: 11/19/2015 Clinic: [facility]  Subjective:  Danielle Bradley is a 20 y.o. G1P0 at 4238w6d being seen today for ongoing prenatal care.  She is currently monitored for the following issues for this low-risk pregnancy and has BMI (body mass index), pediatric, less than 5th percentile for age; Neurofibromatosis, type I (von Recklinghausen's disease) (HCC); and Encounter for supervision of normal first pregnancy in second trimester on her problem list.  Patient reports no complaints.   Contractions: Not present. Vag. Bleeding: None.  Movement: Present. Denies leaking of fluid.   The following portions of the patient's history were reviewed and updated as appropriate: allergies, current medications, past family history, past medical history, past social history, past surgical history and problem list. Problem list updated.  Objective:   Filed Vitals:   11/19/15 1606  BP: 110/67  Pulse: 90  Temp: 98.1 F (36.7 C)  Weight: 108 lb 14.4 oz (49.397 kg)    Fetal Status: Fetal Heart Rate (bpm): 140s Fundal Height: 29 cm Movement: Present     General:  Alert, oriented and cooperative. Patient is in no acute distress.  Skin: Skin is warm and dry. No rash noted.   Cardiovascular: Normal heart rate noted  Respiratory: Normal respiratory effort, no problems with respiration noted  Abdomen: Soft, gravid, appropriate for gestational age. Pain/Pressure: Absent     Pelvic:  Cervical exam deferred        Extremities: Normal range of motion.  Edema: None  Mental Status: Normal mood and affect. Normal behavior. Normal judgment and thought content.   Urinalysis:      Assessment and Plan:  Pregnancy: G1P0 at 6538w6d  1. Encounter for supervision of normal first pregnancy in second trimester DATES CHANGED TODAY. Patient had u/s at 9wks that was only six days difference than LMP so will go on LMP and put her 28/6 today with EDC of 9/28. She states she did her glucola and labs a few weeks  ago but no results in the paper chart. Task sent to pool to try and get these results.  18lbs weight gain thus far in pregnancy  2. Neurofibromatosis, type I (von Recklinghausen's disease) (HCC) Pt states she's received GC outside of pregnancy and doesn't need to see them again.   3. LL placenta 2wk repeat transvaginal ordered.    Preterm labor symptoms and general obstetric precautions including but not limited to vaginal bleeding, contractions, leaking of fluid and fetal movement were reviewed in detail with the patient. Please refer to After Visit Summary for other counseling recommendations.   7-10d in case she needs to repeat her glucola.   [place] Bingharlie Hennessey Cantrell, MD

## 2015-11-24 ENCOUNTER — Telehealth: Payer: Self-pay

## 2015-11-24 NOTE — Telephone Encounter (Signed)
Called patient and she states that she just did a one hr gtt last week. Patient states she cant remember the name of the lab. Patient instructed to go to lab and get copy of results and bring them with her to next appointment or she will have to do a 2 hr gtt test. Patient states understanding. Armandina StammerJennifer Chelcey Caputo RN BSN

## 2015-11-26 ENCOUNTER — Ambulatory Visit (INDEPENDENT_AMBULATORY_CARE_PROVIDER_SITE_OTHER): Payer: Medicaid Other | Admitting: Obstetrics and Gynecology

## 2015-11-26 VITALS — BP 101/60 | HR 95 | Temp 98.5°F | Wt 110.5 lb

## 2015-11-26 DIAGNOSIS — Z331 Pregnant state, incidental: Secondary | ICD-10-CM

## 2015-11-26 DIAGNOSIS — Z1389 Encounter for screening for other disorder: Secondary | ICD-10-CM

## 2015-11-26 DIAGNOSIS — Z3493 Encounter for supervision of normal pregnancy, unspecified, third trimester: Secondary | ICD-10-CM

## 2015-11-26 LAB — POCT URINALYSIS DIPSTICK
BILIRUBIN UA: NEGATIVE
Glucose, UA: NEGATIVE
KETONES UA: NEGATIVE
Nitrite, UA: NEGATIVE
PH UA: 7
PROTEIN UA: NEGATIVE
RBC UA: NEGATIVE
Spec Grav, UA: 1.01
Urobilinogen, UA: 0.2

## 2015-11-26 NOTE — Progress Notes (Signed)
Prenatal Visit Note Date: 11/26/2015 Clinic: [facility]  Subjective:  Danielle Bradley is a 20 y.o. G1P0 at 3068w6d being seen today for ongoing prenatal care.  She is currently monitored for the following issues for this low-risk pregnancy and has BMI (body mass index), pediatric, less than 5th percentile for age; Neurofibromatosis, type I (von Recklinghausen's disease) (HCC); Supervision of normal pregnancy; and Low-lying placenta on her problem list.  Patient reports no complaints.   Contractions: Not present. Vag. Bleeding: None.  Movement: Present. Denies leaking of fluid.   The following portions of the patient's history were reviewed and updated as appropriate: allergies, current medications, past family history, past medical history, past social history, past surgical history and problem list. Problem list updated.  Objective:   Filed Vitals:   11/26/15 1506  BP: 101/60  Pulse: 95  Temp: 98.5 F (36.9 C)  Weight: 110 lb 8 oz (50.122 kg)    Fetal Status:     Movement: Present     General:  Alert, oriented and cooperative. Patient is in no acute distress.  Skin: Skin is warm and dry. No rash noted.   Cardiovascular: Normal heart rate noted  Respiratory: Normal respiratory effort, no problems with respiration noted  Abdomen: Soft, gravid, appropriate for gestational age. Pain/Pressure: Absent     Pelvic:  Cervical exam deferred        Extremities: Normal range of motion.  Edema: None  Mental Status: Normal mood and affect. Normal behavior. Normal judgment and thought content.   Urinalysis:      Assessment and Plan:  Pregnancy: G1P0 at 4768w6d  1. Encounter for supervision of normal first pregnancy in third trimester  Routine care. Pt told to sign release form to get glucola results. She says she talked to them and they faxed it to [facility] when it was down stairs. 4lbs weight gain thus far in pregnancy  2. Neurofibromatosis, type I (von Recklinghausen's disease) (HCC)  Pt states  she's received GC outside of pregnancy and doesn't need to see them again.  3. LL placenta  Has u/s for next week already   Preterm labor symptoms and general obstetric precautions including but not limited to vaginal bleeding, contractions, leaking of fluid and fetal movement were reviewed in detail with the patient. Please refer to After Visit Summary for other counseling recommendations.  No Follow-up on file.   [place] Bingharlie Romain Erion, MD

## 2015-11-27 ENCOUNTER — Encounter: Payer: Self-pay | Admitting: *Deleted

## 2015-12-04 ENCOUNTER — Other Ambulatory Visit: Payer: Medicaid Other

## 2015-12-09 ENCOUNTER — Other Ambulatory Visit: Payer: Medicaid Other

## 2015-12-11 ENCOUNTER — Encounter: Payer: Medicaid Other | Admitting: Obstetrics and Gynecology

## 2015-12-11 ENCOUNTER — Ambulatory Visit (INDEPENDENT_AMBULATORY_CARE_PROVIDER_SITE_OTHER): Payer: Medicaid Other

## 2015-12-11 ENCOUNTER — Ambulatory Visit (INDEPENDENT_AMBULATORY_CARE_PROVIDER_SITE_OTHER): Payer: Medicaid Other | Admitting: Obstetrics

## 2015-12-11 VITALS — BP 102/62 | HR 94 | Temp 98.2°F | Wt 113.7 lb

## 2015-12-11 DIAGNOSIS — O4440 Low lying placenta NOS or without hemorrhage, unspecified trimester: Secondary | ICD-10-CM

## 2015-12-11 DIAGNOSIS — Z331 Pregnant state, incidental: Secondary | ICD-10-CM

## 2015-12-11 DIAGNOSIS — Z1389 Encounter for screening for other disorder: Secondary | ICD-10-CM | POA: Diagnosis not present

## 2015-12-11 DIAGNOSIS — Z0372 Encounter for suspected placental problem ruled out: Secondary | ICD-10-CM

## 2015-12-11 DIAGNOSIS — Z3403 Encounter for supervision of normal first pregnancy, third trimester: Secondary | ICD-10-CM | POA: Diagnosis not present

## 2015-12-11 LAB — POCT URINALYSIS DIPSTICK
Bilirubin, UA: NEGATIVE
Blood, UA: NEGATIVE
GLUCOSE UA: NEGATIVE
KETONES UA: NEGATIVE
Nitrite, UA: NEGATIVE
SPEC GRAV UA: 1.01
UROBILINOGEN UA: 0.2
pH, UA: >=9

## 2015-12-13 LAB — CULTURE, URINE COMPREHENSIVE

## 2015-12-14 ENCOUNTER — Other Ambulatory Visit: Payer: Self-pay | Admitting: Obstetrics

## 2015-12-14 MED ORDER — AMOXICILLIN-POT CLAVULANATE 875-125 MG PO TABS: 1.0000 | ORAL_TABLET | Freq: Two times a day (BID) | ORAL | 1 refills | Status: DC

## 2015-12-15 ENCOUNTER — Encounter: Payer: Self-pay | Admitting: Obstetrics

## 2015-12-15 NOTE — Progress Notes (Signed)
Subjective:    Danielle Bradley is a 20 y.o. female being seen today for her obstetrical visit. She is at 7354w4d gestation. Patient reports no complaints. Fetal movement: normal.  Problem List Items Addressed This Visit    None    Visit Diagnoses    Encounter for supervision of normal first pregnancy in third trimester    -  Primary   Relevant Orders   POCT Urinalysis Dipstick (Completed)   CULTURE, URINE COMPREHENSIVE (Completed)     Patient Active Problem List   Diagnosis Date Noted  . Supervision of normal pregnancy 11/19/2015  . Low-lying placenta 11/19/2015  . Neurofibromatosis, type I (von Recklinghausen's disease) (HCC) 03/26/2014  . BMI (body mass index), pediatric, less than 5th percentile for age 40/10/2013   Objective:    BP 102/62   Pulse 94   Temp 98.2 F (36.8 C)   Wt 113 lb 11.2 oz (51.6 kg)   LMP 05/01/2015 Comment: short in Oct  BMI 21.48 kg/m  FHT:  150 BPM  Uterine Size: size equals dates  Presentation: unsure     Assessment:    Pregnancy @ 6254w4d weeks   Plan:     labs reviewed, problem list updated Consent signed. GBS sent TDAP offered  Rhogam given for RH negative Pediatrician: discussed. Infant feeding: plans to breastfeed. Maternity leave: discussed.  Orders Placed This Encounter  Procedures  . CULTURE, URINE COMPREHENSIVE  . POCT Urinalysis Dipstick   No orders of the defined types were placed in this encounter.  Follow up in 2 Weeks.

## 2015-12-16 ENCOUNTER — Telehealth: Payer: Self-pay

## 2015-12-16 NOTE — Telephone Encounter (Signed)
-----   Message from Brock Badharles A Harper, MD sent at 12/14/2015 11:11 AM EDT ----- Augmentin Rx

## 2015-12-16 NOTE — Telephone Encounter (Signed)
Left for patient to return call to the office to get results of uTI positive and antibiotics sent to pharmacy. Armandina StammerJennifer Ranell Skibinski RNBSN

## 2015-12-22 ENCOUNTER — Ambulatory Visit (INDEPENDENT_AMBULATORY_CARE_PROVIDER_SITE_OTHER): Payer: Medicaid Other | Admitting: Obstetrics & Gynecology

## 2015-12-22 VITALS — BP 121/64 | HR 105 | Temp 98.8°F | Wt 116.0 lb

## 2015-12-22 DIAGNOSIS — O2343 Unspecified infection of urinary tract in pregnancy, third trimester: Secondary | ICD-10-CM | POA: Diagnosis not present

## 2015-12-22 DIAGNOSIS — Z1389 Encounter for screening for other disorder: Secondary | ICD-10-CM

## 2015-12-22 DIAGNOSIS — Z331 Pregnant state, incidental: Secondary | ICD-10-CM | POA: Diagnosis not present

## 2015-12-22 DIAGNOSIS — Z3493 Encounter for supervision of normal pregnancy, unspecified, third trimester: Secondary | ICD-10-CM

## 2015-12-22 DIAGNOSIS — Z3403 Encounter for supervision of normal first pregnancy, third trimester: Secondary | ICD-10-CM | POA: Diagnosis not present

## 2015-12-22 DIAGNOSIS — O2340 Unspecified infection of urinary tract in pregnancy, unspecified trimester: Secondary | ICD-10-CM | POA: Insufficient documentation

## 2015-12-22 DIAGNOSIS — O4440 Low lying placenta NOS or without hemorrhage, unspecified trimester: Secondary | ICD-10-CM

## 2015-12-22 DIAGNOSIS — Z68.41 Body mass index (BMI) pediatric, less than 5th percentile for age: Secondary | ICD-10-CM

## 2015-12-22 DIAGNOSIS — Q8501 Neurofibromatosis, type 1: Secondary | ICD-10-CM

## 2015-12-22 LAB — POCT URINALYSIS DIPSTICK
Bilirubin, UA: NEGATIVE
Blood, UA: NEGATIVE
Glucose, UA: NEGATIVE
Ketones, UA: NEGATIVE
Nitrite, UA: NEGATIVE
PH UA: 8
Spec Grav, UA: 1.015
UROBILINOGEN UA: 0.2

## 2015-12-22 NOTE — Patient Instructions (Signed)

## 2015-12-22 NOTE — Progress Notes (Signed)
Pt c/o pain in lower back and lower abdomen.

## 2015-12-22 NOTE — Progress Notes (Signed)
Subjective:  Danielle Bradley is a 20 y.o. G1P0 at 73105w4d being seen today for ongoing prenatal care.  She is currently monitored for the following issues for this high-risk pregnancy and has BMI (body mass index), pediatric, less than 5th percentile for age; Neurofibromatosis, type I (von Recklinghausen's disease) (HCC); Supervision of normal pregnancy; Low-lying placenta; and UTI (urinary tract infection) in pregnancy, antepartum on her problem list.  Patient reports occasional contractions.  Contractions: Not present. Vag. Bleeding: None.  Movement: Present. Denies leaking of fluid.   The following portions of the patient's history were reviewed and updated as appropriate: allergies, current medications, past family history, past medical history, past social history, past surgical history and problem list. Problem list updated.  Objective:   Vitals:   12/22/15 1545  BP: 121/64  Pulse: (!) 105  Temp: 98.8 F (37.1 C)  Weight: 116 lb (52.6 kg)    Fetal Status: Fetal Heart Rate (bpm): 145 Fundal Height: 34 cm Movement: Present     General:  Alert, oriented and cooperative. Patient is in no acute distress.  Skin: Skin is warm and dry. No rash noted.   Cardiovascular: Normal heart rate noted  Respiratory: Normal respiratory effort, no problems with respiration noted  Abdomen: Soft, gravid, appropriate for gestational age. Pain/Pressure: Present     Pelvic:  Cervical exam deferred        Extremities: Normal range of motion.  Edema: Trace  Mental Status: Normal mood and affect. Normal behavior. Normal judgment and thought content.   Urinalysis: Urine Protein: Negative Urine Glucose: Negative  Assessment and Plan:  Pregnancy: G1P0 at 82105w4d  1. Encounter for supervision of normal first pregnancy in third trimester - POCT Urinalysis Dipstick GBS and cx next visit   2. Supervision of normal pregnancy, third trimester  3. Low-lying placenta Resolved 12/11/2015  4. BMI (body mass  index), pediatric, less than 5th percentile for age FH consistent with dates  5. Neurofibromatosis, type I (von Recklinghausen's disease) (HCC)  6. UTI (urinary tract infection) in pregnancy, antepartum, third trimester - Culture, OB Urine +Urine cx pt did NOT take meds  Preterm labor symptoms and general obstetric precautions including but not limited to vaginal bleeding, contractions, leaking of fluid and fetal movement were reviewed in detail with the patient. Please refer to After Visit Summary for other counseling recommendations.  Return in about 2 weeks (around 01/05/2016).   Willodean Rosenthalarolyn Harraway-Smith, MD

## 2015-12-24 LAB — URINE CULTURE, OB REFLEX

## 2015-12-24 LAB — CULTURE, OB URINE

## 2016-01-05 ENCOUNTER — Ambulatory Visit (INDEPENDENT_AMBULATORY_CARE_PROVIDER_SITE_OTHER): Payer: Medicaid Other | Admitting: Obstetrics & Gynecology

## 2016-01-05 VITALS — BP 109/70 | HR 90 | Temp 98.4°F | Wt 117.7 lb

## 2016-01-05 DIAGNOSIS — Z1389 Encounter for screening for other disorder: Secondary | ICD-10-CM

## 2016-01-05 DIAGNOSIS — Z3493 Encounter for supervision of normal pregnancy, unspecified, third trimester: Secondary | ICD-10-CM

## 2016-01-05 DIAGNOSIS — Z331 Pregnant state, incidental: Secondary | ICD-10-CM

## 2016-01-05 DIAGNOSIS — Z3403 Encounter for supervision of normal first pregnancy, third trimester: Secondary | ICD-10-CM

## 2016-01-05 LAB — POCT URINALYSIS DIPSTICK
BILIRUBIN UA: NEGATIVE
Blood, UA: NEGATIVE
GLUCOSE UA: NEGATIVE
KETONES UA: NEGATIVE
Nitrite, UA: NEGATIVE
PROTEIN UA: NEGATIVE
SPEC GRAV UA: 1.01
Urobilinogen, UA: 0.2
pH, UA: 8

## 2016-01-05 NOTE — Progress Notes (Signed)
Subjective:  Danielle Bradley is a 20 y.o. G1P0 at 7640w4d being seen today for ongoing prenatal care.  She is currently monitored for the following issues for this low-risk pregnancy and has BMI (body mass index), pediatric, less than 5th percentile for age; Neurofibromatosis, type I (von Recklinghausen's disease) (HCC); Supervision of normal pregnancy; Low-lying placenta; and UTI (urinary tract infection) in pregnancy, antepartum on her problem list.  Patient reports no complaints.  Contractions: Irregular. Vag. Bleeding: None.  Movement: Present. Denies leaking of fluid.   The following portions of the patient's history were reviewed and updated as appropriate: allergies, current medications, past family history, past medical history, past social history, past surgical history and problem list. Problem list updated.  Objective:   Vitals:   01/05/16 1013  BP: 109/70  Pulse: 90  Temp: 98.4 F (36.9 C)  Weight: 117 lb 11.2 oz (53.4 kg)    Fetal Status: Fetal Heart Rate (bpm): 140 Fundal Height: 36 cm Movement: Present     General:  Alert, oriented and cooperative. Patient is in no acute distress.  Skin: Skin is warm and dry. No rash noted.   Cardiovascular: Normal heart rate noted  Respiratory: Normal respiratory effort, no problems with respiration noted  Abdomen: Soft, gravid, appropriate for gestational age. Pain/Pressure: Present     Pelvic:  Cervical exam performed        Extremities: Normal range of motion.  Edema: None  Mental Status: Normal mood and affect. Normal behavior. Normal judgment and thought content.   Urinalysis: Urine Protein: Negative Urine Glucose: Negative  Assessment and Plan:  Pregnancy: G1P0 at 340w4d  1. Encounter for supervision of normal first pregnancy in third trimester GBS done - POCT Urinalysis Dipstick  Preterm labor symptoms and general obstetric precautions including but not limited to vaginal bleeding, contractions, leaking of fluid and fetal  movement were reviewed in detail with the patient. Please refer to After Visit Summary for other counseling recommendations.  Return in about 1 week (around 01/12/2016).   Adam PhenixJames G Arnold, MD

## 2016-01-05 NOTE — Patient Instructions (Signed)

## 2016-01-07 LAB — URINE CULTURE, OB REFLEX

## 2016-01-07 LAB — CULTURE, OB URINE

## 2016-01-09 ENCOUNTER — Emergency Department (HOSPITAL_COMMUNITY)
Admission: EM | Admit: 2016-01-09 | Discharge: 2016-01-10 | Disposition: A | Discharge status: Home / Self Care | Payer: Medicaid Other | Attending: Emergency Medicine | Admitting: Emergency Medicine

## 2016-01-09 ENCOUNTER — Encounter (HOSPITAL_COMMUNITY): Payer: Self-pay | Admitting: Emergency Medicine

## 2016-01-09 DIAGNOSIS — K0889 Other specified disorders of teeth and supporting structures: Secondary | ICD-10-CM | POA: Diagnosis present

## 2016-01-09 DIAGNOSIS — K047 Periapical abscess without sinus: Secondary | ICD-10-CM | POA: Diagnosis not present

## 2016-01-09 MED ORDER — AMOXICILLIN 500 MG PO CAPS
500.0000 mg | ORAL_CAPSULE | Freq: Once | ORAL | Status: AC
  Administered 2016-01-09: 500 mg via ORAL
  Filled 2016-01-09: qty 1

## 2016-01-09 MED ORDER — SODIUM CHLORIDE 0.9 % IV BOLUS (SEPSIS)
1000.0000 mL | Freq: Once | INTRAVENOUS | Status: AC
  Administered 2016-01-09: 1000 mL via INTRAVENOUS

## 2016-01-09 MED ORDER — ACETAMINOPHEN 500 MG PO TABS
1000.0000 mg | ORAL_TABLET | Freq: Once | ORAL | Status: AC
  Administered 2016-01-09: 1000 mg via ORAL
  Filled 2016-01-09: qty 2

## 2016-01-09 NOTE — ED Notes (Signed)
RR OB RN notified.

## 2016-01-09 NOTE — ED Provider Notes (Signed)
[facility]EMER[facility]ENCY DEPT Provider Note   CSN: [id number] Arrival date & time: 01/09/16  2139     History   Chief Complaint Chief Complaint  Patient presents with  . Dental Pain  . Jaw Pain    HPI Danielle Bradley is a 20 y.o. female.  HPI    Patient to the ER with complaints of left lower jaw pain.  She has been having pain for two days and now has swelling to the jaw. She says she has been eating and drinking well. She is 9 months pregnant but is not having any complications related to the pregnancy at this time.She last took Tylenol 8 hours ago which does help some with the pain. Her pain is now a 5/10. She has not had any fevers, nausea, vomiting or diarrhea. Her heart rate is 126 in triage, she says she has been drinking water normal but that her tooth hurts.   Past Medical History:  Diagnosis Date  . Neurofibromatosis, type 1 [facility])     Patient Active Problem List   Diagnosis Date Noted  . UTI (urinary tract infection) in pregnancy, antepartum 12/22/2015  . Supervision of normal pregnancy 11/19/2015  . Low-lying placenta 11/19/2015  . Neurofibromatosis, type I (von Recklinghausen's disease) (HCC) 03/26/2014  . BMI (body mass index), pediatric, less than 5th percentile for age 54/10/2013    Past Surgical History:  Procedure Laterality Date  . NO PAST SURGERIES      OB History    Gravida Para Term Preterm AB Living   1         0   SAB TAB Ectopic Multiple Live Births                   Home Medications    Prior to Admission medications   Medication Sig Start Date End Date Taking? Authorizing Provider  acetaminophen (TYLENOL) 325 MG tablet Take 650 mg by mouth every 6 (six) hours as needed for moderate pain.   Yes Historical Provider, MD  Prenatal Vit-Fe Fumarate-FA (MULTIVITAMIN-PRENATAL) 27-0.8 MG TABS tablet Take 1 tablet by mouth daily at 12 noon.   Yes Historical Provider, MD  amoxicillin (AMOXIL) 500 MG capsule Take 1 capsule (500 mg total) by mouth 3  (three) times daily. 01/10/16   Onelia Cadmus Neva Seat, PA-C  amoxicillin-clavulanate (AUGMENTIN) 875-125 MG tablet Take 1 tablet by mouth 2 (two) times daily. Patient not taking: Reported on 01/09/2016 12/14/15   Brock Bad, MD    Family History Family History  Problem Relation Age of Onset  . Neurofibromatosis Mother   . Neurofibromatosis Sister     Social History Social History  Substance Use Topics  . Smoking status: Never Smoker  . Smokeless tobacco: Never Used  . Alcohol use No     Allergies   Review of patient's allergies indicates no known allergies.   Review of Systems Review of Systems   Review of Systems All other systems negative except as documented in the HPI. All pertinent positives and negatives as reviewed in the HPI.   Physical Exam Updated Vital Signs BP 109/74   Pulse 91   Temp 98.3 F (36.8 C) (Oral)   Resp 18   LMP 05/01/2015 Comment: short in Oct  SpO2 100%   Physical Exam  Constitutional: She appears well-developed and well-nourished. No distress.  HENT:  Head: Normocephalic and atraumatic.  Mouth/Throat: Uvula is midline, oropharynx is clear and moist and mucous membranes are normal. No oral lesions. No trismus in the jaw.  Normal dentition. Dental abscesses (unable to see from inside of her mouth but it is palpable externally) and dental caries (Pts tooth shows no obvious abscess but moderate to severe tenderness to palpation of marked tooth) present. No uvula swelling or lacerations.  Eyes: Pupils are equal, round, and reactive to light.  Neck: Trachea normal, normal range of motion and full passive range of motion without pain. Neck supple.  Cardiovascular: Regular rhythm, normal heart sounds and normal pulses.  Tachycardia present.   Pulmonary/Chest: Effort normal and breath sounds normal. No respiratory distress. Chest wall is not dull to percussion. She exhibits no tenderness, no crepitus, no edema, no deformity and no retraction.  Abdominal:  Normal appearance.  Musculoskeletal: Normal range of motion.  Neurological: She is alert. She has normal strength.  Skin: Skin is warm, dry and intact. She is not diaphoretic.  Psychiatric: She has a normal mood and affect. Her speech is normal. Cognition and memory are normal.  Nursing note and vitals reviewed.    ED Treatments / Results  Labs (all labs ordered are listed, but only abnormal results are displayed) Labs Reviewed - No data to display  EKG  EKG Interpretation None       Radiology No results found.  Procedures Procedures (including critical care time)  Medications Ordered in ED Medications  sodium chloride 0.9 % bolus 1,000 mL (1,000 mLs Intravenous New Bag/Given 01/09/16 2345)  acetaminophen (TYLENOL) tablet 1,000 mg (1,000 mg Oral Given 01/09/16 2258)  amoxicillin (AMOXIL) capsule 500 mg (500 mg Oral Given 01/09/16 2258)  sodium chloride 0.9 % bolus 1,000 mL (0 mLs Intravenous Stopped 01/09/16 2358)     Initial Impression / Assessment and Plan / ED Course  I have reviewed the triage vital signs and the nursing notes.  Pertinent labs & imaging results that were available during my care of the patient were reviewed by me and considered in my medical decision making (see chart for details).  Clinical Course    The patient is very well appearing. Her tachycardia is likely due to pregnancy, pain and infection. She was given 2 L of normal saline in the ED and her pulse improved to 90. She was given 1g Tylenol and 500 mg Amoxicillin, advised to call dentist on Monday morning to discuss appropriate treatment, especially given that she is 9 mo pregnant.   20 y.o.[name]'s evaluation in the Emergency Department is complete.  We have discussed signs and symptoms that warrant return to the ED, such as changes or worsening in symptoms. No emergent s/sx's present. Patent airway. No trismus.  No neck tenderness or protrusion of tongue or floor of mouth. Patient will  be given an rx for Amoxicillin. He will be referred to a dentist with instructions for follow-up.  Vital signs are stable at discharge. Vitals:   01/09/16 2300 01/09/16 2330  BP: 119/78 109/74  Pulse: 111 91  Resp: 22 18  Temp:      Patient/guardian has voiced understanding and agreed to follow-up with the PCP or specialist.   Final Clinical Impressions(s) / ED Diagnoses   Final diagnoses:  Dental abscess    New Prescriptions New Prescriptions   AMOXICILLIN (AMOXIL) 500 MG CAPSULE    Take 1 capsule (500 mg total) by mouth 3 (three) times daily.     Marlon Peliffany Wisdom Rickey, PA-C 01/10/16 [phone]    Blane OharaJoshua Zavitz, MD 01/16/16 [phone]

## 2016-01-09 NOTE — ED Triage Notes (Signed)
Pt. reports left lower dental pain radiating to left jaw with swelling onset 2 days ago , denies injury , no fever or chills. Pt. is 9 months pregnant .

## 2016-01-10 MED ORDER — AMOXICILLIN 500 MG PO CAPS: 500.0000 mg | ORAL_CAPSULE | Freq: Three times a day (TID) | ORAL | 0 refills | Status: DC

## 2016-01-10 NOTE — Discharge Instructions (Signed)
[place], [place] °Free Dental Care Clinics (Also Low Cost And Sliding Scale) °Home  °[place] [facility]  °[place]  °[facility]DentalCare.us is a free website maintained by users like you. Our volunteers work hard to make sure the information on these clinics is up to date and accurate.  ° °Services Listed: °1. Free Dental Clinics °2. Sliding Fee Scale Dental Clinics °3. Low Cost Affordable Dental Clinics °4. Non Profit Dental Clinics  ° °Please be aware than not all clinics are completely free. Some cities also have a low number of clinics so in many cases we have included nearby clinics in the search results.  °If you are aware of any clinics that offer free or low cost services to patients needing dental care please contact us. Also, if you are the owner of a clinic or work at a clinic that is listed on this website and wish to update our site please contact us.  °The free dental care facilited listed in our [place], [place] page are mostly contributed by users like you that help improve the content quality of this free website. If you live in [place],  and cannot afford dental coverage there are government and non-profit programs that cater to local residents in need. These services include: Cleanings, Checkups, Caps, Dentures, Braces.  °Help Us Help You ° °Tell us what type of assistance you are looking for. °Help For Children and Single Mothers °Low Income Housing °Unemployment Benefits °Food Stamps Assistance °Medical Assistance °Dental Assistance °Drug and Alchohol Rehab ° ° ° ° ° ° ° ° °Top 6 clinics in or near [place] °1. [facility]  ° °[address] °[place], [place] - [address] [phone] °Clinic Full Details  °[facility]. [facility]. Provides exams, treatment, cleanings, and emergency care for financially eligible children. We take children up to age 21 who are enrolled in  Medicaid or [place] Health Choice; pregnant women with a Medi ° °Clinic Full Details  °2. [facility]  ° °13 miles away from [place]  °[address] °[place], [place] - [address] [phone] °Clinic Full Details  °[facility]: 13 miles from [place] °[facility]. Healthy teeth are an important part of healthy bodies. Our Dental Clinic staff provides exams, treatment, cleanings, and emergency care for financially eligible children. We take children up to age 21 who are enrolled °website  °Clinic Full Details  °3. [facility]  ° °21 miles away from [place]  °[address] °[place], [place] - [address] [phone] °Clinic Full Details  °[facility]: [place] miles from [place] °[facility] [place] [facility] It is the mission of the [facility] to recognize the right of low income, uninsured citizens of [place] to have access to health care that compassionately meets their basic medical, dental and pharmacy needs. [facility] provides basic extractions,  °website  °Clinic Full Details  °4. [facility]  ° °22 miles away from [place]  °[address] °[place], [place] - [address] [phone] °Clinic Full Details  °[facility]: 22 miles from [place] °[place]ission: To provide access to compassionate, high-quality healthcare services to the medically uninsured and underserved who reside in [place], [place] or [place] counties and meet the eligibility guidelines of the [facility]. What we do: - Provide access to high-quality medical and dental °website  °  Clinic Full Details  °5. [facility]  ° °25 miles away from [place]  °[address] °[place], [place] - [address] [phone] °Clinic Full Details  °[facility]: 25 miles from [place] °[facility] Clinic. ° °Clinic Full Details  °6. [facility]  ° °[address] away from [place]  °[place], [place] - [address]  °Clinic Full Details  °[facility]: 26 miles from [place] ° ° °Clinic Full Details  ° °

## 2016-01-11 LAB — STREP GP B CULTURE+RFLX: STREP GP B CULTURE+RFLX: POSITIVE — AB

## 2016-01-11 LAB — STREP GP B SUSCEPTIBILITY

## 2016-01-15 ENCOUNTER — Ambulatory Visit (INDEPENDENT_AMBULATORY_CARE_PROVIDER_SITE_OTHER): Payer: Medicaid Other | Admitting: Obstetrics & Gynecology

## 2016-01-15 ENCOUNTER — Telehealth: Payer: Self-pay | Admitting: *Deleted

## 2016-01-15 DIAGNOSIS — O99820 Streptococcus B carrier state complicating pregnancy: Secondary | ICD-10-CM

## 2016-01-15 DIAGNOSIS — Z3493 Encounter for supervision of normal pregnancy, unspecified, third trimester: Secondary | ICD-10-CM

## 2016-01-15 DIAGNOSIS — Z23 Encounter for immunization: Secondary | ICD-10-CM

## 2016-01-15 NOTE — Patient Instructions (Signed)
Return to clinic for any scheduled appointments or obstetric concerns, or go to MAU for evaluation  Group B streptococcus (GBS) is a type of bacteria often found in healthy women. GBS is not the same as the bacteria that causes strep throat. You may have GBS in your vagina, rectum, or bladder. GBS does not spread through sexual contact, but it can be passed to a baby during childbirth. This can be dangerous for your baby. It is not dangerous to you and usually does not cause any symptoms. Your health care provider may test you for GBS when your pregnancy is between 35 and 37 weeks. GBS is dangerous only during birth, so there is no need to test for it earlier. It is possible to have GBS during pregnancy and never pass it to your baby. If your test results are positive for GBS, your health care provider may recommend giving you antibiotic medicine during delivery to make sure your baby stays healthy. RISK FACTORS You are more likely to pass GBS to your baby if:   Your water breaks (ruptured membrane) or you go into labor before 37 weeks.  Your water breaks 18 hours before you deliver.  You passed GBS during a previous pregnancy.  You have a urinary tract infection caused by GBS any time during pregnancy.  You have a fever during labor. SYMPTOMS Most women who have GBS do not have any symptoms. If you have a urinary tract infection caused by GBS, you might have frequent or painful urination and fever. Babies who get GBS usually show symptoms within 7 days of birth. Symptoms may include:   Breathing problems.  Heart and blood pressure problems.  Digestive and kidney problems. DIAGNOSIS Routine screening for GBS is recommended for all pregnant women. A health care provider takes a sample of the fluid in your vagina and rectum with a swab. It is then sent to a lab to be checked for GBS. A sample of your urine may also be checked for the bacteria.  TREATMENT If you test positive for GBS, you  may need treatment with an antibiotic medicine during labor. As soon as you go into labor, or as soon as your membranes rupture, you will get the antibiotic medicine through an IV access. You will continue to get the medicine until after you give birth. You do not need antibiotic medicine if you are having a cesarean delivery.If your baby shows signs or symptoms of GBS after birth, your baby can also be treated with an antibiotic medicine. HOME CARE INSTRUCTIONS   Take all antibiotic medicine as prescribed by your health care provider. Only take medicine as directed.   Continue with prenatal visits and care.   Keep all follow-up appointments.  SEEK MEDICAL CARE IF:   You have pain when you urinate.   You have to urinate frequently.   You have a fever.  SEEK IMMEDIATE MEDICAL CARE IF:   Your membranes rupture.  You go into labor.   This information is not intended to replace advice given to you by your health care provider. Make sure you discuss any questions you have with your health care provider.   Document Released: 08/03/2007 Document Revised: 05/01/2013 Document Reviewed: 02/16/2013 Elsevier Interactive Patient Education [employer]2016 Elsevier Inc.

## 2016-01-15 NOTE — Progress Notes (Signed)
   PRENATAL VISIT NOTE  Subjective:  Danielle Bradley is a 20 y.o. G1P0 at 7456w0d being seen today for ongoing prenatal care.  She is currently monitored for the following issues for this low-risk pregnancy and has BMI (body mass index), pediatric, less than 5th percentile for age; Neurofibromatosis, type I (von Recklinghausen's disease) (HCC); Supervision of normal pregnancy; Low-lying placenta; UTI (urinary tract infection) in pregnancy, antepartum; and Group B Streptococcus carrier, +RV culture, currently pregnant on her problem list.  Patient reports no complaints.  Contractions: Irregular. Vag. Bleeding: None.  Movement: Present. Denies leaking of fluid.   The following portions of the patient's history were reviewed and updated as appropriate: allergies, current medications, past family history, past medical history, past social history, past surgical history and problem list. Problem list updated.  Objective:   Vitals:   01/15/16 1002  BP: 108/73  Pulse: (!) 110  Temp: 98.3 F (36.8 C)  Weight: 124 lb 6.4 oz (56.4 kg)    Fetal Status: Fetal Heart Rate (bpm): 145 Fundal Height: 38 cm Movement: Present     General:  Alert, oriented and cooperative. Patient is in no acute distress.  Skin: Skin is warm and dry. No rash noted.   Cardiovascular: Normal heart rate noted  Respiratory: Normal respiratory effort, no problems with respiration noted  Abdomen: Soft, gravid, appropriate for gestational age. Pain/Pressure: Present     Pelvic:  Cervical exam deferred        Extremities: Normal range of motion.  Edema: Trace  Mental Status: Normal mood and affect. Normal behavior. Normal judgment and thought content.   Urinalysis: Urine Protein: Trace Urine Glucose: Negative  Assessment and Plan:  Pregnancy: G1P0 at 3456w0d  1. Group B Streptococcus carrier, +RV culture, currently pregnant Patient informed.  Will treat in labor.   2. Supervision of normal pregnancy, third trimester - Tdap  vaccine greater than or equal to 7yo IM Term labor symptoms and general obstetric precautions including but not limited to vaginal bleeding, contractions, leaking of fluid and fetal movement were reviewed in detail with the patient. Please refer to After Visit Summary for other counseling recommendations.  Return in about 1 week (around 01/22/2016) for OB Visit.  Tereso NewcomerUgonna A Abdul Beirne, MD

## 2016-01-20 NOTE — Telephone Encounter (Signed)
Error

## 2016-01-27 ENCOUNTER — Ambulatory Visit (INDEPENDENT_AMBULATORY_CARE_PROVIDER_SITE_OTHER): Payer: Medicaid Other | Admitting: Obstetrics and Gynecology

## 2016-01-27 ENCOUNTER — Encounter: Payer: Self-pay | Admitting: Obstetrics and Gynecology

## 2016-01-27 VITALS — BP 114/69 | HR 91 | Temp 98.7°F | Wt 121.4 lb

## 2016-01-27 DIAGNOSIS — Z3493 Encounter for supervision of normal pregnancy, unspecified, third trimester: Secondary | ICD-10-CM | POA: Diagnosis not present

## 2016-01-27 DIAGNOSIS — Q8501 Neurofibromatosis, type 1: Secondary | ICD-10-CM

## 2016-01-27 DIAGNOSIS — O99820 Streptococcus B carrier state complicating pregnancy: Secondary | ICD-10-CM

## 2016-01-27 NOTE — Progress Notes (Signed)
Subjective:  Danielle Bradley is a 20 y.o. G1P0 at 8186w5d being seen today for ongoing prenatal care.  She is currently monitored for the following issues for this low-risk pregnancy and has BMI (body mass index), pediatric, less than 5th percentile for age; Neurofibromatosis, type I (von Recklinghausen's disease) (HCC); Supervision of normal pregnancy; and Group B Streptococcus carrier, +RV culture, currently pregnant on her problem list.  Patient reports no complaints.  Contractions: Irregular. Vag. Bleeding: None.  Movement: Present. Denies leaking of fluid.   The following portions of the patient's history were reviewed and updated as appropriate: allergies, current medications, past family history, past medical history, past social history, past surgical history and problem list. Problem list updated.  Objective:   Vitals:   01/27/16 1427  BP: 114/69  Pulse: 91  Temp: 98.7 F (37.1 C)  Weight: 121 lb 6.4 oz (55.1 kg)    Fetal Status:     Movement: Present     General:  Alert, oriented and cooperative. Patient is in no acute distress.  Skin: Skin is warm and dry. No rash noted.   Cardiovascular: Normal heart rate noted  Respiratory: Normal respiratory effort, no problems with respiration noted  Abdomen: Soft, gravid, appropriate for gestational age. Pain/Pressure: Present     Pelvic:  Cervical exam performed        Extremities: Normal range of motion.  Edema: None  Mental Status: Normal mood and affect. Normal behavior. Normal judgment and thought content.   Urinalysis: Urine Protein: Negative Urine Glucose: Negative  Assessment and Plan:  Pregnancy: G1P0 at 7586w5d  1. Supervision of normal pregnancy, third trimester   2. Group B Streptococcus carrier, +RV culture, currently pregnant   3. Neurofibromatosis, type I (von Recklinghausen's disease) (HCC)   Term labor symptoms and general obstetric precautions including but not limited to vaginal bleeding, contractions, leaking  of fluid and fetal movement were reviewed in detail with the patient. Please refer to After Visit Summary for other counseling recommendations.  Return in about 1 week (around 02/03/2016) for OB visit.   Hermina StaggersMichael L Zael Shuman, MD

## 2016-01-27 NOTE — Progress Notes (Signed)
Patient states that she has irregular contractions that have been daily since 01-24-16, no vaginal bleeding.

## 2016-01-28 ENCOUNTER — Inpatient Hospital Stay (HOSPITAL_COMMUNITY)
Admission: AD | Admit: 2016-01-28 | Discharge: 2016-01-31 | DRG: 775 | Disposition: A | Discharge status: Home / Self Care | Payer: Medicaid Other | Source: Ambulatory Visit | Attending: Obstetrics & Gynecology | Admitting: Obstetrics & Gynecology

## 2016-01-28 ENCOUNTER — Inpatient Hospital Stay (HOSPITAL_COMMUNITY): Payer: Medicaid Other | Admitting: Anesthesiology

## 2016-01-28 ENCOUNTER — Inpatient Hospital Stay (HOSPITAL_COMMUNITY)
Admission: AD | Admit: 2016-01-28 | Discharge: 2016-01-28 | Disposition: A | Discharge status: Home / Self Care | Payer: Medicaid Other | Source: Ambulatory Visit | Attending: Obstetrics and Gynecology | Admitting: Obstetrics and Gynecology

## 2016-01-28 ENCOUNTER — Encounter (HOSPITAL_COMMUNITY): Payer: Self-pay

## 2016-01-28 ENCOUNTER — Encounter (HOSPITAL_COMMUNITY): Payer: Self-pay | Admitting: *Deleted

## 2016-01-28 DIAGNOSIS — Z3A39 39 weeks gestation of pregnancy: Secondary | ICD-10-CM

## 2016-01-28 DIAGNOSIS — O99824 Streptococcus B carrier state complicating childbirth: Secondary | ICD-10-CM | POA: Diagnosis present

## 2016-01-28 DIAGNOSIS — Z3403 Encounter for supervision of normal first pregnancy, third trimester: Secondary | ICD-10-CM | POA: Diagnosis present

## 2016-01-28 DIAGNOSIS — Q8501 Neurofibromatosis, type 1: Secondary | ICD-10-CM

## 2016-01-28 DIAGNOSIS — IMO0001 Reserved for inherently not codable concepts without codable children: Secondary | ICD-10-CM

## 2016-01-28 LAB — TYPE AND SCREEN
ABO/RH(D): O POS
ANTIBODY SCREEN: NEGATIVE

## 2016-01-28 LAB — CBC
HEMATOCRIT: 33.5 % — AB (ref 36.0–46.0)
HEMOGLOBIN: 11.3 g/dL — AB (ref 12.0–15.0)
MCH: 28.1 pg (ref 26.0–34.0)
MCHC: 33.7 g/dL (ref 30.0–36.0)
MCV: 83.3 fL (ref 78.0–100.0)
Platelets: 233 10*3/uL (ref 150–400)
RBC: 4.02 MIL/uL (ref 3.87–5.11)
RDW: 13.5 % (ref 11.5–15.5)
WBC: 9.7 10*3/uL (ref 4.0–10.5)

## 2016-01-28 LAB — POCT FERN TEST: POCT FERN TEST: POSITIVE

## 2016-01-28 MED ORDER — PHENYLEPHRINE 40 MCG/ML (10ML) SYRINGE FOR IV PUSH (FOR BLOOD PRESSURE SUPPORT)
80.0000 ug | PREFILLED_SYRINGE | INTRAVENOUS | Status: DC | PRN
  Filled 2016-01-28: qty 5

## 2016-01-28 MED ORDER — PENICILLIN G POTASSIUM 5000000 UNITS IJ SOLR
5.0000 10*6.[IU] | Freq: Once | INTRAVENOUS | Status: AC
  Administered 2016-01-28: 5 10*6.[IU] via INTRAVENOUS
  Filled 2016-01-28: qty 5

## 2016-01-28 MED ORDER — FENTANYL 2.5 MCG/ML BUPIVACAINE 1/10 % EPIDURAL INFUSION (WH - ANES)
14.0000 mL/h | INTRAMUSCULAR | Status: DC | PRN
  Administered 2016-01-28 – 2016-01-29 (×3): 14 mL/h via EPIDURAL
  Filled 2016-01-28 (×2): qty 125

## 2016-01-28 MED ORDER — ONDANSETRON HCL 4 MG/2ML IJ SOLN: 4.0000 mg | Freq: Four times a day (QID) | INTRAMUSCULAR | Status: DC | PRN

## 2016-01-28 MED ORDER — ACETAMINOPHEN 325 MG PO TABS: 650.0000 mg | ORAL_TABLET | ORAL | Status: DC | PRN

## 2016-01-28 MED ORDER — EPHEDRINE 5 MG/ML INJ
10.0000 mg | INTRAVENOUS | Status: DC | PRN
  Filled 2016-01-28: qty 4

## 2016-01-28 MED ORDER — PENICILLIN G POTASSIUM 5000000 UNITS IJ SOLR
2.5000 10*6.[IU] | INTRAMUSCULAR | Status: DC
  Administered 2016-01-29 (×2): 2.5 10*6.[IU] via INTRAVENOUS
  Filled 2016-01-28 (×6): qty 2.5

## 2016-01-28 MED ORDER — DIPHENHYDRAMINE HCL 50 MG/ML IJ SOLN: 12.5000 mg | INTRAMUSCULAR | Status: DC | PRN

## 2016-01-28 MED ORDER — LACTATED RINGERS IV SOLN: 500.0000 mL | INTRAVENOUS | Status: DC | PRN

## 2016-01-28 MED ORDER — OXYCODONE-ACETAMINOPHEN 5-325 MG PO TABS
1.0000 | ORAL_TABLET | ORAL | Status: DC | PRN
  Administered 2016-01-29: 1 via ORAL
  Filled 2016-01-28: qty 1

## 2016-01-28 MED ORDER — SOD CITRATE-CITRIC ACID 500-334 MG/5ML PO SOLN
30.0000 mL | ORAL | Status: DC | PRN
  Administered 2016-01-29: 30 mL via ORAL
  Filled 2016-01-28: qty 15

## 2016-01-28 MED ORDER — FENTANYL CITRATE (PF) 100 MCG/2ML IJ SOLN: 100.0000 ug | INTRAMUSCULAR | Status: DC | PRN

## 2016-01-28 MED ORDER — ZOLPIDEM TARTRATE 5 MG PO TABS: 5.0000 mg | ORAL_TABLET | Freq: Every evening | ORAL | 0 refills | Status: DC | PRN

## 2016-01-28 MED ORDER — OXYCODONE-ACETAMINOPHEN 5-325 MG PO TABS: 2.0000 | ORAL_TABLET | ORAL | Status: DC | PRN

## 2016-01-28 MED ORDER — OXYTOCIN BOLUS FROM INFUSION
500.0000 mL | Freq: Once | INTRAVENOUS | Status: AC
  Administered 2016-01-29: 500 mL via INTRAVENOUS

## 2016-01-28 MED ORDER — PHENYLEPHRINE 40 MCG/ML (10ML) SYRINGE FOR IV PUSH (FOR BLOOD PRESSURE SUPPORT)
80.0000 ug | PREFILLED_SYRINGE | INTRAVENOUS | Status: DC | PRN
  Filled 2016-01-28: qty 5
  Filled 2016-01-28: qty 10

## 2016-01-28 MED ORDER — LACTATED RINGERS IV SOLN
500.0000 mL | Freq: Once | INTRAVENOUS | Status: AC
  Administered 2016-01-28: 500 mL via INTRAVENOUS

## 2016-01-28 MED ORDER — LACTATED RINGERS IV SOLN
INTRAVENOUS | Status: DC
  Administered 2016-01-28 – 2016-01-29 (×3): via INTRAVENOUS

## 2016-01-28 MED ORDER — OXYTOCIN 40 UNITS IN LACTATED RINGERS INFUSION - SIMPLE MED
2.5000 [IU]/h | INTRAVENOUS | Status: DC
  Filled 2016-01-28: qty 1000

## 2016-01-28 MED ORDER — LIDOCAINE HCL (PF) 1 % IJ SOLN
30.0000 mL | INTRAMUSCULAR | Status: DC | PRN
  Administered 2016-01-29: 30 mL via SUBCUTANEOUS
  Filled 2016-01-28: qty 30

## 2016-01-28 NOTE — Discharge Instructions (Signed)
Braxton Hicks Contractions °Contractions of the uterus can occur throughout pregnancy. Contractions are not always a sign that you are in labor.  °WHAT ARE BRAXTON HICKS CONTRACTIONS?  °Contractions that occur before labor are called Braxton Hicks contractions, or false labor. Toward the end of pregnancy (32-34 weeks), these contractions can develop more often and may become more forceful. This is not true labor because these contractions do not result in opening (dilatation) and thinning of the cervix. They are sometimes difficult to tell apart from true labor because these contractions can be forceful and people have different pain tolerances. You should not feel embarrassed if you go to the hospital with false labor. Sometimes, the only way to tell if you are in true labor is for your health care provider to look for changes in the cervix. °If there are no prenatal problems or other health problems associated with the pregnancy, it is completely safe to be sent home with false labor and await the onset of true labor. °HOW CAN YOU TELL THE DIFFERENCE BETWEEN TRUE AND FALSE LABOR? °False Labor °· The contractions of false labor are usually shorter and not as hard as those of true labor.   °· The contractions are usually irregular.   °· The contractions are often felt in the front of the lower abdomen and in the groin.   °· The contractions may go away when you walk around or change positions while lying down.   °· The contractions get weaker and are shorter lasting as time goes on.   °· The contractions do not usually become progressively stronger, regular, and closer together as with true labor.   °True Labor °· Contractions in true labor last 30-70 seconds, become very regular, usually become more intense, and increase in frequency.   °· The contractions do not go away with walking.   °· The discomfort is usually felt in the top of the uterus and spreads to the lower abdomen and low back.   °· True labor can be  determined by your health care provider with an exam. This will show that the cervix is dilating and getting thinner.   °WHAT TO REMEMBER °· Keep up with your usual exercises and follow other instructions given by your health care provider.   °· Take medicines as directed by your health care provider.   °· Keep your regular prenatal appointments.   °· Eat and drink lightly if you think you are going into labor.   °· If Braxton Hicks contractions are making you uncomfortable:   °¨ Change your position from lying down or resting to walking, or from walking to resting.   °¨ Sit and rest in a tub of warm water.   °¨ Drink 2-3 glasses of water. Dehydration may cause these contractions.   °¨ Do slow and deep breathing several times an hour.   °WHEN SHOULD I SEEK IMMEDIATE MEDICAL CARE? °Seek immediate medical care if: °· Your contractions become stronger, more regular, and closer together.   °· You have fluid leaking or gushing from your vagina.   °· You have a fever.   °· You pass blood-tinged mucus.   °· You have vaginal bleeding.   °· You have continuous abdominal pain.   °· You have low back pain that you never had before.   °· You feel your baby's head pushing down and causing pelvic pressure.   °· Your baby is not moving as much as it used to.   °  °This information is not intended to replace advice given to you by your health care provider. Make sure you discuss any questions you have with your health care   provider. °  °Document Released: 04/26/2005 Document Revised: 05/01/2013 Document Reviewed: 02/05/2013 °Elsevier Interactive Patient Education ©2016 [employer] °Fetal Movement Counts °Patient Name: __________________________________________________ Patient Due Date: ____________________ °Performing a fetal movement count is highly recommended in high-risk pregnancies, but it is good for every pregnant woman to do. Your health care provider may ask you to start counting fetal movements at 28 weeks of the  pregnancy. Fetal movements often increase: °· After eating a full meal. °· After physical activity. °· After eating or drinking something sweet or cold. °· At rest. °Pay attention to when you feel the baby is most active. This will help you notice a pattern of your baby's sleep and wake cycles and what factors contribute to an increase in fetal movement. It is important to perform a fetal movement count at the same time each day when your baby is normally most active.  °HOW TO COUNT FETAL MOVEMENTS °1. Find a quiet and comfortable area to sit or lie down on your left side. Lying on your left side provides the best blood and oxygen circulation to your baby. °2. Write down the day and time on a sheet of paper or in a journal. °3. Start counting kicks, flutters, swishes, rolls, or jabs in a 2-hour period. You should feel at least 10 movements within 2 hours. °4. If you do not feel 10 movements in 2 hours, wait 2-3 hours and count again. Look for a change in the pattern or not enough counts in 2 hours. °SEEK MEDICAL CARE IF: °· You feel less than 10 counts in 2 hours, tried twice. °· There is no movement in over an hour. °· The pattern is changing or taking longer each day to reach 10 counts in 2 hours. °· You feel the baby is not moving as he or she usually does. °Date: ____________ Movements: ____________ Start time: ____________ Finish time: ____________  °Date: ____________ Movements: ____________ Start time: ____________ Finish time: ____________ °Date: ____________ Movements: ____________ Start time: ____________ Finish time: ____________ °Date: ____________ Movements: ____________ Start time: ____________ Finish time: ____________ °Date: ____________ Movements: ____________ Start time: ____________ Finish time: ____________ °Date: ____________ Movements: ____________ Start time: ____________ Finish time: ____________ °Date: ____________ Movements: ____________ Start time: ____________ Finish time:  ____________ °Date: ____________ Movements: ____________ Start time: ____________ Finish time: ____________  °Date: ____________ Movements: ____________ Start time: ____________ Finish time: ____________ °Date: ____________ Movements: ____________ Start time: ____________ Finish time: ____________ °Date: ____________ Movements: ____________ Start time: ____________ Finish time: ____________ °Date: ____________ Movements: ____________ Start time: ____________ Finish time: ____________ °Date: ____________ Movements: ____________ Start time: ____________ Finish time: ____________ °Date: ____________ Movements: ____________ Start time: ____________ Finish time: ____________ °Date: ____________ Movements: ____________ Start time: ____________ Finish time: ____________  °Date: ____________ Movements: ____________ Start time: ____________ Finish time: ____________ °Date: ____________ Movements: ____________ Start time: ____________ Finish time: ____________ °Date: ____________ Movements: ____________ Start time: ____________ Finish time: ____________ °Date: ____________ Movements: ____________ Start time: ____________ Finish time: ____________ °Date: ____________ Movements: ____________ Start time: ____________ Finish time: ____________ °Date: ____________ Movements: ____________ Start time: ____________ Finish time: ____________ °Date: ____________ Movements: ____________ Start time: ____________ Finish time: ____________  °Date: ____________ Movements: ____________ Start time: ____________ Finish time: ____________ °Date: ____________ Movements: ____________ Start time: ____________ Finish time: ____________ °Date: ____________ Movements: ____________ Start time: ____________ Finish time: ____________ °Date: ____________ Movements: ____________ Start time: ____________ Finish time: ____________ °Date: ____________ Movements: ____________ Start time: ____________ Finish time: ____________ °Date: ____________ Movements:  ____________ Start time: ____________ Finish time:   ____________ °Date: ____________ Movements: ____________ Start time: ____________ Finish time: ____________  °Date: ____________ Movements: ____________ Start time: ____________ Finish time: ____________ °Date: ____________ Movements: ____________ Start time: ____________ Finish time: ____________ °Date: ____________ Movements: ____________ Start time: ____________ Finish time: ____________ °Date: ____________ Movements: ____________ Start time: ____________ Finish time: ____________ °Date: ____________ Movements: ____________ Start time: ____________ Finish time: ____________ °Date: ____________ Movements: ____________ Start time: ____________ Finish time: ____________ °Date: ____________ Movements: ____________ Start time: ____________ Finish time: ____________  °Date: ____________ Movements: ____________ Start time: ____________ Finish time: ____________ °Date: ____________ Movements: ____________ Start time: ____________ Finish time: ____________ °Date: ____________ Movements: ____________ Start time: ____________ Finish time: ____________ °Date: ____________ Movements: ____________ Start time: ____________ Finish time: ____________ °Date: ____________ Movements: ____________ Start time: ____________ Finish time: ____________ °Date: ____________ Movements: ____________ Start time: ____________ Finish time: ____________ °Date: ____________ Movements: ____________ Start time: ____________ Finish time: ____________  °Date: ____________ Movements: ____________ Start time: ____________ Finish time: ____________ °Date: ____________ Movements: ____________ Start time: ____________ Finish time: ____________ °Date: ____________ Movements: ____________ Start time: ____________ Finish time: ____________ °Date: ____________ Movements: ____________ Start time: ____________ Finish time: ____________ °Date: ____________ Movements: ____________ Start time: ____________ Finish  time: ____________ °Date: ____________ Movements: ____________ Start time: ____________ Finish time: ____________ °Date: ____________ Movements: ____________ Start time: ____________ Finish time: ____________  °Date: ____________ Movements: ____________ Start time: ____________ Finish time: ____________ °Date: ____________ Movements: ____________ Start time: ____________ Finish time: ____________ °Date: ____________ Movements: ____________ Start time: ____________ Finish time: ____________ °Date: ____________ Movements: ____________ Start time: ____________ Finish time: ____________ °Date: ____________ Movements: ____________ Start time: ____________ Finish time: ____________ °Date: ____________ Movements: ____________ Start time: ____________ Finish time: ____________ °  °This information is not intended to replace advice given to you by your health care provider. Make sure you discuss any questions you have with your health care provider. °  °Document Released: 05/26/2006 Document Revised: 05/17/2014 Document Reviewed: 02/21/2012 °Elsevier Interactive Patient Education ©2016 Else[employer] Inc. ° °

## 2016-01-28 NOTE — MAU Note (Signed)
Notified provider that patient is unchanged after another hour. Provider said patient can be discharged after administering something for sleep.

## 2016-01-28 NOTE — MAU Note (Signed)
Patient presents with ctxs 3 mins apart. Patient denies any bleeding or LOF. Fetus active

## 2016-01-28 NOTE — MAU Note (Signed)
Pt reports back to back contractions that are stronger than when she was in MAU earlier

## 2016-01-28 NOTE — MAU Note (Signed)
Contractions are closer and stronger.

## 2016-01-28 NOTE — Anesthesia Preprocedure Evaluation (Signed)
Anesthesia Evaluation  Patient identified by MRN, date of birth, ID band Patient awake    Reviewed: Allergy & Precautions, H&P , Patient's Chart, lab work & pertinent test results  Airway Mallampati: I  TM Distance: >3 FB Neck ROM: full    Dental no notable dental hx.    Pulmonary neg pulmonary ROS,    Pulmonary exam normal        Cardiovascular negative cardio ROS Normal cardiovascular exam     Neuro/Psych negative neurological ROS  negative psych ROS   GI/Hepatic negative GI ROS, Neg liver ROS,   Endo/Other  negative endocrine ROS  Renal/GU negative Renal ROS     Musculoskeletal   Abdominal Normal abdominal exam  (+)   Peds  Hematology negative hematology ROS (+)   Anesthesia Other Findings   Reproductive/Obstetrics (+) Pregnancy                             Anesthesia Physical Anesthesia Plan  ASA: II  Anesthesia Plan: Epidural   Post-op Pain Management:    Induction:   Airway Management Planned:   Additional Equipment:   Intra-op Plan:   Post-operative Plan:   Informed Consent: I have reviewed the patients History and Physical, chart, labs and discussed the procedure including the risks, benefits and alternatives for the proposed anesthesia with the patient or authorized representative who has indicated his/her understanding and acceptance.     Plan Discussed with:   Anesthesia Plan Comments:         Anesthesia Quick Evaluation  

## 2016-01-28 NOTE — MAU Note (Signed)
Notified provider that patient is here for a labor eval. Patient is 1/80/-2. Provider said patient can ambulate for an hour then be rechecked.

## 2016-01-28 NOTE — H&P (Signed)
Danielle BarnacleDynasia Lindell is a 20 y.o. female G1 @ 38.6wks by LMP and 9wk scan presenting for reg ctx. Denies leak or bldg. [facility] and has been remarkable for 1) neurofibromatosis 2) GBS pos  OB History    Gravida Para Term Preterm AB Living   1         0   SAB TAB Ectopic Multiple Live Births                 Past Medical History:  Diagnosis Date  . 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 NO PAST SURGERIES     Family History: family history includes Neurofibromatosis in her mother and sister. Social History:  reports that she has never smoked. She has never used smokeless tobacco. She reports that she does not drink alcohol or use drugs.     Maternal Diabetes: No Genetic Screening: Declined Maternal Ultrasounds/Referrals: Normal Fetal Ultrasounds or other Referrals:  None Maternal Substance Abuse:  No Significant Maternal Medications:  None Significant Maternal Lab Results:  Lab values include: Group B Strep positive Other Comments:  None  ROS History Dilation: 4 Effacement (%): 90 Station: -2 Exam by:: B Mosca Blood pressure 118/73, pulse 77, temperature 98.4 F (36.9 C), resp. rate 17, height 5\' 1"  (1.549 m), weight 54.4 kg (120 lb), last menstrual period 05/01/2015, SpO2 100 %, unknown if currently breastfeeding. Exam Physical Exam  Constitutional: She is oriented to person, place, and time. She appears well-developed.  HENT:  Head: Normocephalic.  Neck: Normal range of motion.  Cardiovascular: Normal rate.   Respiratory: Effort normal.  GI:  EFM 130s, +10x10 accels, occ mi variables Ctx q 2 mins  Musculoskeletal: Normal range of motion.  Neurological: She is alert and oriented to person, place, and time.  Skin: Skin is warm and dry.  Psychiatric: She has a normal mood and affect. Her behavior is normal. Thought content normal.    Prenatal labs: ABO, Rh: O/Positive/-- (02/14  0000) Antibody: Negative (02/14 0000) Rubella: Immune (02/14 0000) RPR: Nonreactive (02/14 0000)  HBsAg: Negative (02/14 0000)  HIV: Non-reactive (02/14 0000)  GBS:     Assessment/Plan: IUP@38 .6wks Latent phase labor GBS pos  Admit to [facility] Expectant management PCN for GBS ppx Plans epidural Anticipate SVD   Kristene Liberati CNM 01/28/2016, 10:16 PM

## 2016-01-29 ENCOUNTER — Encounter (HOSPITAL_COMMUNITY): Payer: Self-pay | Admitting: *Deleted

## 2016-01-29 DIAGNOSIS — O99824 Streptococcus B carrier state complicating childbirth: Secondary | ICD-10-CM

## 2016-01-29 DIAGNOSIS — Q8501 Neurofibromatosis, type 1: Secondary | ICD-10-CM

## 2016-01-29 DIAGNOSIS — Z3A39 39 weeks gestation of pregnancy: Secondary | ICD-10-CM

## 2016-01-29 LAB — RPR: RPR Ser Ql: NONREACTIVE

## 2016-01-29 MED ORDER — DIPHENHYDRAMINE HCL 25 MG PO CAPS: 25.0000 mg | ORAL_CAPSULE | Freq: Four times a day (QID) | ORAL | Status: DC | PRN

## 2016-01-29 MED ORDER — SIMETHICONE 80 MG PO CHEW: 80.0000 mg | CHEWABLE_TABLET | ORAL | Status: DC | PRN

## 2016-01-29 MED ORDER — OXYTOCIN 40 UNITS IN LACTATED RINGERS INFUSION - SIMPLE MED
1.0000 m[IU]/min | INTRAVENOUS | Status: DC
  Administered 2016-01-29: 2 m[IU]/min via INTRAVENOUS

## 2016-01-29 MED ORDER — SENNOSIDES-DOCUSATE SODIUM 8.6-50 MG PO TABS
2.0000 | ORAL_TABLET | ORAL | Status: DC
  Administered 2016-01-30 (×2): 2 via ORAL
  Filled 2016-01-29 (×2): qty 2

## 2016-01-29 MED ORDER — LIDOCAINE HCL (PF) 1 % IJ SOLN
INTRAMUSCULAR | Status: DC | PRN
  Administered 2016-01-28 (×2): 6 mL via EPIDURAL

## 2016-01-29 MED ORDER — ONDANSETRON HCL 4 MG/2ML IJ SOLN: 4.0000 mg | INTRAMUSCULAR | Status: DC | PRN

## 2016-01-29 MED ORDER — ZOLPIDEM TARTRATE 5 MG PO TABS: 5.0000 mg | ORAL_TABLET | Freq: Every evening | ORAL | Status: DC | PRN

## 2016-01-29 MED ORDER — DIBUCAINE 1 % RE OINT: 1.0000 "application " | TOPICAL_OINTMENT | RECTAL | Status: DC | PRN

## 2016-01-29 MED ORDER — TETANUS-DIPHTH-ACELL PERTUSSIS 5-2.5-18.5 LF-MCG/0.5 IM SUSP: 0.5000 mL | Freq: Once | INTRAMUSCULAR | Status: DC

## 2016-01-29 MED ORDER — PRENATAL MULTIVITAMIN CH
1.0000 | ORAL_TABLET | Freq: Every day | ORAL | Status: DC
  Administered 2016-01-30: 1 via ORAL
  Filled 2016-01-29: qty 1

## 2016-01-29 MED ORDER — TERBUTALINE SULFATE 1 MG/ML IJ SOLN
0.2500 mg | Freq: Once | INTRAMUSCULAR | Status: DC | PRN
  Filled 2016-01-29: qty 1

## 2016-01-29 MED ORDER — WITCH HAZEL-GLYCERIN EX PADS
1.0000 "application " | MEDICATED_PAD | CUTANEOUS | Status: DC | PRN
  Administered 2016-01-30: 1 via TOPICAL

## 2016-01-29 MED ORDER — BENZOCAINE-MENTHOL 20-0.5 % EX AERO
1.0000 "application " | INHALATION_SPRAY | CUTANEOUS | Status: DC | PRN
  Administered 2016-01-29 – 2016-01-30 (×2): 1 via TOPICAL
  Filled 2016-01-29 (×2): qty 56

## 2016-01-29 MED ORDER — ONDANSETRON HCL 4 MG PO TABS: 4.0000 mg | ORAL_TABLET | ORAL | Status: DC | PRN

## 2016-01-29 MED ORDER — IBUPROFEN 600 MG PO TABS
600.0000 mg | ORAL_TABLET | Freq: Four times a day (QID) | ORAL | Status: DC
  Administered 2016-01-29 – 2016-01-31 (×7): 600 mg via ORAL
  Filled 2016-01-29 (×7): qty 1

## 2016-01-29 MED ORDER — COCONUT OIL OIL: 1.0000 "application " | TOPICAL_OIL | Status: DC | PRN

## 2016-01-29 MED ORDER — ACETAMINOPHEN 325 MG PO TABS: 650.0000 mg | ORAL_TABLET | ORAL | Status: DC | PRN

## 2016-01-29 NOTE — Anesthesia Pain Management Evaluation Note (Signed)
  [facility] Pain Management Visit Note  Patient: Jacklynn BarnacleDynasia Vandam, 20 y.o., female  "Hello I am a [facility] of the anesthesia [facility]. We have an anesthesia team available at all times to provide care throughout the hospital, including epidural management and anesthesia for C-section. I don't know your plan for the delivery whether it a natural birth, water birth, IV sedation, nitrous supplementation, doula or epidural, but we want to meet your pain goals."   1.Was your pain managed to your expectations on prior hospitalizations?   No prior hospitalizations  2.What is your expectation for pain management during this hospitalization?     Epidural  3.How can we help you reach that goal? epidural  Record the patient's initial score and the patient's pain goal.   Pain: 0  Pain Goal: 4 [facility] wants you to be able to say your pain was always managed very well.  Madison HickmanGREGORY,Jeraldin Fesler 01/29/2016

## 2016-01-29 NOTE — Progress Notes (Signed)
Patient ID: Danielle Bradley, female   DOB: Jul 19, 1995, 20 y.o.   MRN: [id number]  Comfortable w/ epidural; s/p 2 doses PCN Afeb, VSS FHR 130s, +accels, no decels, occ variables Ctx q 2-4 mins Cx 8/C/0; AROM for clear fluid  IUP@term  Active labor  Check cx in 2 hrs or sooner prn  Cam HaiSHAW, Romi Rathel CNM 01/29/2016 4:24 AM

## 2016-01-29 NOTE — Progress Notes (Signed)
Patient ID: Danielle Bradley Ayler, female   DOB: 05-02-1996, 20 y.o.   MRN: [id number] LABOR PROGRESS NOTE  Danielle Bradley Spaeth is a 20 y.o. G1P0 at 9148w0d  admitted for SOL  Subjective: Doing well, not feeling much pressure.  Objective: BP 118/77   Pulse (!) 106   Temp 98.3 F (36.8 C) (Oral)   Resp 18   Ht 5\' 1"  (1.549 m)   Wt 54.4 kg (120 lb)   LMP 05/01/2015   SpO2 98%   BMI 22.67 kg/m  or  Vitals:   01/29/16 0731 01/29/16 0801 01/29/16 0831 01/29/16 0901  BP: 123/77 124/75 131/71 118/77  Pulse: (!) 115 91 90 (!) 106  Resp: 18 18  18   Temp:  98.3 F (36.8 C)    TempSrc:  Oral    SpO2:      Weight:      Height:         Dilation: 10 Dilation Complete Date: 01/29/16 Dilation Complete Time: 0835 Effacement (%): 100 Cervical Position: Middle Station: +1 Presentation: Vertex Exam by:: Davis,RN FHT: 125 baseline, moderate variability, +accelerations, early decelerations Uterine activity: q2-875min  Labs: Lab Results  Component Value Date   WBC 9.7 01/28/2016   HGB 11.3 (L) 01/28/2016   HCT 33.5 (L) 01/28/2016   MCV 83.3 01/28/2016   PLT 233 01/28/2016    Patient Active Problem List   Diagnosis Date Noted  . Active labor at term 01/28/2016  . Group B Streptococcus carrier, +RV culture, currently pregnant 01/15/2016  . Supervision of normal pregnancy 11/19/2015  . Neurofibromatosis, type I (von Recklinghausen's disease) (HCC) 03/26/2014  . BMI (body mass index), pediatric, less than 5th percentile for age 51/10/2013    Assessment / Plan: 20 y.o. G1P0 at 3848w0d here for SOL  Labor: expectant management Fetal Wellbeing:  Category I Pain Control:  epidural Anticipated MOD:  SVD  Leland HerElsia J Yoo, DO PGY-1 9/21/20179:28 AM

## 2016-01-29 NOTE — Anesthesia Procedure Notes (Signed)
Epidural Patient location during procedure: OB Start time: 01/28/2016 11:34 PM End time: 01/28/2016 11:38 PM  Staffing Anesthesiologist: Leilani AbleHATCHETT, Carra Brindley Performed: anesthesiologist   Preanesthetic Checklist Completed: patient identified, surgical consent, pre-op evaluation, timeout performed, IV checked, risks and benefits discussed and monitors and equipment checked  Epidural Patient position: sitting Prep: site prepped and draped and DuraPrep Patient monitoring: continuous pulse ox and blood pressure Approach: midline Location: L3-L4 Injection technique: LOR air  Needle:  Needle type: Tuohy  Needle gauge: 17 G Needle length: 9 cm and 9 Needle insertion depth: 4 cm Catheter type: closed end flexible Catheter size: 19 Gauge Catheter at skin depth: 9 cm Test dose: negative and Other  Assessment Sensory level: T9 Events: blood not aspirated, injection not painful, no injection resistance, negative IV test and no paresthesia  Additional Notes Reason for block:procedure for pain

## 2016-01-29 NOTE — Lactation Note (Signed)
This note was copied from a baby's chart. Lactation Consultation Note  Patient Name: Danielle Bradley OZHYQ'MToday's Date: 01/29/2016 Reason for consult: Initial assessment   Initial consult with first time mom of 1 hour old infant. Infant STS with mom and quietly alert. Mom reports she plans to breast feed for about a week to see how it goes and then may give formula. Discussed supply and demand and milk coming to volume.   Assisted mom in latching infant to left breast in laid back position. Infant latched immediately with flanged lips, rhythmic suckles and intermittent swallows. Infant fed steadily and was still feeding when I left the room. Enc mom to feed infant 8-12 x in 24 hours at first feeding cues.   Mom with small breasts and everted nipples. Mom reports + breast changes with pregnancy. Showed mom hot to hand express and no colostrum noted at this time.   BF Resources Handout and LC Brochure given, mom informed of IP/OP Services, BF Support Groups and LC phone #. [facility] She is aware to call and make a WIC appt post d/c.   Enc mom to call with questions/concerns prn. Follow up tomorrow and prn.    Maternal Data Formula Feeding for Exclusion: No Has patient been taught Hand Expression?: Yes Does the patient have breastfeeding experience prior to this delivery?: No  Feeding Feeding Type: Breast Fed Length of feed: 15 min (still feeding when I left the room. )  LATCH Score/Interventions Latch: Grasps breast easily, tongue down, lips flanged, rhythmical sucking.  Audible Swallowing: A few with stimulation Intervention(s): Skin to skin  Type of Nipple: Everted at rest and after stimulation  Comfort (Breast/Nipple): Soft / non-tender     Hold (Positioning): Assistance needed to correctly position infant at breast and maintain latch. Intervention(s): Breastfeeding basics reviewed;Support Pillows;Position options;Skin to  skin  LATCH Score: 8  Lactation Tools Discussed/Used WIC Program: Yes   Consult Status Consult Status: Follow-up Date: 01/30/16 Follow-up type: In-patient    Danielle Bradley 01/29/2016, 3:13 PM

## 2016-01-30 MED ORDER — SENNOSIDES-DOCUSATE SODIUM 8.6-50 MG PO TABS: 2.0000 | ORAL_TABLET | ORAL | 0 refills | Status: DC

## 2016-01-30 MED ORDER — IBUPROFEN 600 MG PO TABS: 600.0000 mg | ORAL_TABLET | Freq: Four times a day (QID) | ORAL | 0 refills | Status: DC

## 2016-01-30 NOTE — Plan of Care (Signed)
Problem: Nutritional: Goal: Mother's verbalization of comfort with breastfeeding process will improve Outcome: Progressing Pt reports comfort with breastfeeding but states her desire is to supplement with formula.  LEAD explained.  Pt educated on risks of formula feeding and chooses to continue with supplementation with formula.   Pt encouraged to latch baby to breast prior to giving formula. Pt verbalizes understanding.

## 2016-01-30 NOTE — Lactation Note (Signed)
This note was copied from a baby's chart. Lactation Consultation Note  Mother states she has been mostly formula bottle feeding because she states her nipples are sore. No trauma noted.  Reviewed hand expression and suggest she apply ebm. Encouraged her to call for help with her next feeding to help w/ latch. Also suggest since she pump if she is not latching.  Mother states she pumped and did not receive anything but drops. Encouraged her to keep it up to stimulate milk supply.   Patient Name: Danielle Bradley ZOXWR'UToday's Date: 01/30/2016 Reason for consult: Follow-up assessment   Maternal Data    Feeding Feeding Type: Formula Nipple Type: Slow - flow Length of feed: 5 min  LATCH Score/Interventions                      Lactation Tools Discussed/Used     Consult Status Consult Status: Follow-up Date: 01/31/16 Follow-up type: In-patient    Dahlia ByesBerkelhammer, Andrika Peraza [facility] 01/30/2016, 2:43 PM

## 2016-01-30 NOTE — Discharge Instructions (Signed)

## 2016-01-30 NOTE — Progress Notes (Signed)
Post Partum Day 1 Subjective: no complaints, up ad lib, voiding, tolerating PO and + flatus  Objective: Blood pressure (!) 114/50, pulse 99, temperature 97.9 F (36.6 C), temperature source Oral, resp. rate 18, height 5\' 1"  (1.549 m), weight 54.4 kg (120 lb), last menstrual period 05/01/2015, SpO2 98 %, unknown if currently breastfeeding.  Physical Exam:  General: alert, cooperative and no distress Lochia: appropriate Uterine Fundus: firm DVT Evaluation: No evidence of DVT seen on physical exam. Negative Homan's sign.   Recent Labs  01/28/16 2230  HGB 11.3*  HCT 33.5*    Assessment/Plan: Plan for discharge tomorrow vs possibly later today.   LOS: 2 days   Leland Herlsia J Yoo 01/30/2016, 9:12 AM    OB FELLOW POSTPARTUM PROGRESS NOTE ATTESTATION  I have seen and examined this patient and agree with above documentation in the resident's note.   Jen MowElizabeth Mumaw, DO

## 2016-01-30 NOTE — Anesthesia Postprocedure Evaluation (Signed)
Anesthesia Post Note  Patient: Danielle Bradley  Procedure(s) Performed: * No procedures listed *  Patient location during evaluation: Mother Baby Anesthesia Type: Epidural Level of consciousness: awake and alert Pain management: pain level controlled Vital Signs Assessment: post-procedure vital signs reviewed and stable Respiratory status: spontaneous breathing, nonlabored ventilation and respiratory function stable Cardiovascular status: stable Postop Assessment: no headache, no backache and epidural receding Anesthetic complications: no     Last Vitals:  Vitals:   01/29/16 1615 01/29/16 2040  BP: 119/69 (!) 114/50  Pulse: 92 99  Resp: 16 18  Temp: 36.6 C 36.6 C    Last Pain:  Vitals:   01/30/16 0026  TempSrc:   PainSc: 2    Pain Goal: Patients Stated Pain Goal: 0 (01/28/16 2320)               Junious SilkGILBERT,Francis Doenges

## 2016-01-31 NOTE — Progress Notes (Signed)
Discharge instructions given to Mother and Baby given at this time.  All personal belongings have been removed from room.  All questions answered, and scheduled appointments discussed.

## 2016-01-31 NOTE — Lactation Note (Signed)
This note was copied from a baby's chart. Lactation Consultation Note  Patient Name: Danielle Jacklynn BarnacleDynasia Maves RUEAV'WToday's Date: 01/31/2016 Reason for consult: Follow-up assessment Mom giving lots of bottles but reports baby latched well this morning and she had no nipple pain. LC stressed importance of BF with each feeding before giving any bottles to encourage milk production, prevent engorgement and protect milk supply. Advised to refer to Baby N Me booklet page 24 for engorgement care if needed. Offered to observe/assist with latch before d/c, Mom declined. Advised of OP services and support group.    Maternal Data    Feeding    LATCH Score/Interventions                      Lactation Tools Discussed/Used     Consult Status Consult Status: Complete Date: 01/31/16 Follow-up type: In-patient    Alfred LevinsGranger, Jermine Bibbee Ann 01/31/2016, 10:11 AM

## 2016-01-31 NOTE — Discharge Summary (Signed)
OB Discharge Summary     Patient Name: Danielle BarnacleDynasia Macintyre DOB: 10-10-1995 MRN: [id number]  Date of admission: 01/28/2016 Delivering MD: Jen MowMUMAW, [facility]   Date of discharge: 01/31/2016  Admitting diagnosis: 39w constant ctx, pressure Intrauterine pregnancy: 4345w0d     Secondary diagnosis:  Active Problems:   Active labor at term   Vacuum extractor delivery, delivered  Additional problems: none     Discharge diagnosis: Term Pregnancy Delivered                                                                                                Post partum procedures:none  Augmentation: AROM  Complications: None  Hospital course:  Onset of Labor With Vaginal Delivery     20 y.o. yo G1P1001 at 4945w0d was admitted in Latent Labor on 01/28/2016. Patient had an uncomplicated labor course as follows:  Membrane Rupture Time/Date: 4:19 AM ,01/29/2016   Intrapartum Procedures: Episiotomy: None [1]                                         Lacerations:  2nd degree [3]  Patient had a delivery of a Viable infant via vacuum due to maternal exhaustion. 01/29/2016  Information for the patient's newborn:  Nile RiggsRobbins, Boy Lexandra [[id number]][[id number]]  Delivery Method: Vag-Vacuum    Pateint had an uncomplicated postpartum course.  She is ambulating, tolerating a regular diet, passing flatus, and urinating well. Patient is discharged home in stable condition on 01/31/16.    Physical exam Vitals:   01/29/16 1615 01/29/16 2040 01/30/16 1752 01/31/16 0553  BP: 119/69 (!) 114/50 105/63 103/61  Pulse: 92 99 80 76  Resp: 16 18 18 18   Temp: 97.9 F (36.6 C) 97.9 F (36.6 C) 97.7 F (36.5 C) 98.1 F (36.7 C)  TempSrc: Oral Oral Oral Oral  SpO2:      Weight:      Height:       General: alert and cooperative Lochia: appropriate Uterine Fundus: firm Incision: N/A DVT Evaluation: No evidence of DVT seen on physical exam. Labs: Lab Results  Component Value Date   WBC 9.7 01/28/2016   HGB 11.3 (L)  01/28/2016   HCT 33.5 (L) 01/28/2016   MCV 83.3 01/28/2016   PLT 233 01/28/2016   CMP Latest Ref Rng & Units 02/21/2015  Glucose 65 - 99 mg/dL 97  BUN 6 - 20 mg/dL 11  Creatinine 8.650.44 - 7.841.00 mg/dL 6.960.75  Sodium 295135 - 284145 mmol/L 140  Potassium 3.5 - 5.1 mmol/L 3.9  Chloride 101 - 111 mmol/L 107  CO2 22 - 32 mmol/L 26  Calcium 8.9 - 10.3 mg/dL 9.3  Total Protein 6.0 - 8.3 g/dL -  Total Bilirubin 0.3 - 1.2 mg/dL -  Alkaline Phos 39 - 132117 U/L -  AST 0 - 37 U/L -  ALT 0 - 35 U/L -    Discharge instruction: per After Visit Summary and "Baby and Me Booklet".  After visit meds:    Medication List  TAKE these medications   acetaminophen 325 MG tablet Commonly known as:  TYLENOL Take 650 mg by mouth every 6 (six) hours as needed for moderate pain.   ibuprofen 600 MG tablet Commonly known as:  ADVIL,MOTRIN Take 1 tablet (600 mg total) by mouth every 6 (six) hours.   multivitamin-prenatal 27-0.8 MG Tabs tablet Take 1 tablet by mouth daily at 12 noon.   senna-docusate 8.6-50 MG tablet Commonly known as:  Senokot-S Take 2 tablets by mouth daily.   zolpidem 5 MG tablet Commonly known as:  AMBIEN Take 1 tablet (5 mg total) by mouth at bedtime as needed for sleep.       Diet: routine diet  Activity: Advance as tolerated. Pelvic rest for 6 weeks.   Outpatient follow up:6 weeks Follow up Appt:Future Appointments Date Time Provider Department Center  03/11/2016 10:00 AM Tereso Newcomer, MD [facility]   Follow up Visit:No Follow-up on file.  Postpartum contraception: Depo Provera  Newborn Data: Live born female  Birth Weight: 8 lb 6 oz (3799 g) APGAR: 7, 9  Baby Feeding: Bottle and Breast Disposition:home with mother   01/31/2016 Cam Hai, CNM  9:17 AM

## 2016-02-03 ENCOUNTER — Encounter: Payer: Medicaid Other | Admitting: Obstetrics and Gynecology

## 2016-03-11 ENCOUNTER — Ambulatory Visit: Payer: Self-pay | Admitting: Obstetrics & Gynecology

## 2016-03-12 ENCOUNTER — Encounter: Payer: Self-pay | Admitting: Obstetrics & Gynecology

## 2016-03-12 ENCOUNTER — Ambulatory Visit (INDEPENDENT_AMBULATORY_CARE_PROVIDER_SITE_OTHER): Payer: Medicaid Other | Admitting: Obstetrics & Gynecology

## 2016-03-12 DIAGNOSIS — Z3042 Encounter for surveillance of injectable contraceptive: Secondary | ICD-10-CM

## 2016-03-12 DIAGNOSIS — Z3202 Encounter for pregnancy test, result negative: Secondary | ICD-10-CM | POA: Diagnosis not present

## 2016-03-12 LAB — POCT URINE PREGNANCY: PREG TEST UR: NEGATIVE

## 2016-03-12 MED ORDER — MEDROXYPROGESTERONE ACETATE 150 MG/ML IM SUSP
150.0000 mg | INTRAMUSCULAR | Status: DC
  Administered 2016-03-12: 150 mg via INTRAMUSCULAR

## 2016-03-12 NOTE — Patient Instructions (Signed)
Return to clinic for any scheduled appointments or for any gynecologic concerns as needed.   

## 2016-03-12 NOTE — Addendum Note (Signed)
Addended by: Natale MilchSTALLING, Kolsen Choe D on: 03/12/2016 12:24 PM   Modules accepted: Orders

## 2016-03-12 NOTE — Progress Notes (Signed)
Post Partum Exam  Danielle Bradley is a 20 y.o. 461P1001 female who presents for a postpartum visit. She is 6 weeks postpartum following a VAVD. I have fully reviewed the prenatal and intrapartum course. The delivery was at 39 gestational weeks.  Anesthesia: epidural. Postpartum course has been uncomplicated. Baby's course has been uncomplicated. Baby is feeding by bottle - Enfamil with Iron. Bleeding no bleeding. Bowel function is abnormal: constipation. Bladder function is normal. Patient is not sexually active. Contraception method is Depo-Provera injections. Postpartum depression screening:neg  The following portions of the patient's history were reviewed and updated as appropriate: allergies, current medications, past family history, past medical history, past social history, past surgical history and problem list.  Normal pap smear on 06/24/15.  Review of Systems Pertinent items noted in HPI and remainder of comprehensive ROS otherwise negative.   Objective:    BP 116/78 mmHg  Pulse 78  Resp 16  Ht 5\' 5"  (1.651 m)  Wt 211 lb (95.709 kg)  BMI 35.11 kg/m2  Breastfeeding? Yes  General:  alert and no distress   Breasts:  deferred  Lungs: clear to auscultation bilaterally  Heart:  regular rate and rhythm  Abdomen: soft, non-tender; bowel sounds normal; no masses,  no organomegaly   Pelvic:  not evaluated         Assessment:   Normal postpartum exam. Pap smear not done at today's visit.   Plan:   1. Contraception: Depo-Provera injections, given today. 2. Follow up in: 3 months for repeat Depo Provera or as needed.  3. OTC remedies recommended for constipation.   Jaynie CollinsUGONNA  ANYANWU, MD, FACOG Attending Obstetrician & Gynecologist, [facility] for [facility], [facility]

## 2016-04-16 ENCOUNTER — Encounter (HOSPITAL_COMMUNITY): Payer: Self-pay | Admitting: Emergency Medicine

## 2016-04-16 ENCOUNTER — Emergency Department (HOSPITAL_COMMUNITY)
Admission: EM | Admit: 2016-04-16 | Discharge: 2016-04-16 | Disposition: A | Discharge status: Home / Self Care | Payer: Medicaid Other | Attending: Emergency Medicine | Admitting: Emergency Medicine

## 2016-04-16 DIAGNOSIS — H1032 Unspecified acute conjunctivitis, left eye: Secondary | ICD-10-CM | POA: Diagnosis present

## 2016-04-16 MED ORDER — ERYTHROMYCIN 5 MG/GM OP OINT
1.0000 "application " | TOPICAL_OINTMENT | Freq: Once | OPHTHALMIC | Status: AC
  Administered 2016-04-16: 1 via OPHTHALMIC
  Filled 2016-04-16: qty 3.5

## 2016-04-16 MED ORDER — FLUORESCEIN SODIUM 0.6 MG OP STRP
1.0000 | ORAL_STRIP | Freq: Once | OPHTHALMIC | Status: AC
  Administered 2016-04-16: 1 via OPHTHALMIC
  Filled 2016-04-16: qty 1

## 2016-04-16 MED ORDER — TETRACAINE HCL 0.5 % OP SOLN
2.0000 [drp] | Freq: Once | OPHTHALMIC | Status: AC
  Administered 2016-04-16: 2 [drp] via OPHTHALMIC
  Filled 2016-04-16: qty 2

## 2016-04-16 NOTE — Discharge Instructions (Signed)
Apply erythromycin ointment to your lower eyelid 4 times daily for 1 week. Use warm compresses 3-4 times daily. Try not to touch your eyes and make sure to wash your hands often, as this is very contagious.

## 2016-04-16 NOTE — ED Notes (Signed)
Pt stated her eye started  hurting on 04/15/17. Today she woke up with the redness and itchy. Some blurry vision.

## 2016-04-16 NOTE — ED Provider Notes (Signed)
[facility]-EMERGENCY DEPT Provider Note   CSN: [id number] Arrival date & time: 04/16/16  2108  By signing my name below, I, Danielle Bradley, attest that this documentation has been prepared under the direction and in the presence of  Buel ReamAlexandra Kalli Greenfield, PA-C. Electronically Signed: Doreatha MartinEva Bradley, ED Scribe. 04/16/16. 9:46 PM.    History   Chief Complaint Chief Complaint  Patient presents with  . Conjunctivitis    HPI Danielle Bradley is a 20 y.o. female who presents to the Emergency Department complaining of moderate left eye pain, itching, and irritation onset yesterday morning with associated redness, clear drainage, mild blurred vision, mild photophobia. Pt wears glasses, but no contact lenses. She denies any known injury to the eye. Pt has not had any known exposures to conjunctivitis, but works in a daycare. No alleviating factors noted. She denies recent illness, fevers, cough, congestion, rhinorrhea, abdominal pain, nausea, vomiting, CP, SOB, dysuria, frequency, urgency, hematuria.   The history is provided by the patient. No language interpreter was used.    Past Medical History:  Diagnosis Date  . Neurofibromatosis, type 1 [facility](HCC)     Patient Active Problem List   Diagnosis Date Noted  . 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 NO PAST SURGERIES      OB History    Gravida Para Term Preterm AB Living   1 1 1     1    SAB TAB Ectopic Multiple Live Births         0 1       Home Medications    Prior to Admission medications   Medication Sig Start Date End Date Taking? Authorizing Provider  acetaminophen (TYLENOL) 325 MG tablet Take 650 mg by mouth every 6 (six) hours as needed for moderate pain.    Historical Provider, MD  ibuprofen (ADVIL,MOTRIN) 600 MG tablet Take 1 tablet (600 mg total) by mouth every 6 (six) hours. Patient not taking: Reported on 03/12/2016 01/30/16   Leland HerElsia J Yoo, DO    Prenatal Vit-Fe Fumarate-FA (MULTIVITAMIN-PRENATAL) 27-0.8 MG TABS tablet Take 1 tablet by mouth daily at 12 noon.    Historical Provider, MD  senna-docusate (SENOKOT-S) 8.6-50 MG tablet Take 2 tablets by mouth daily. Patient not taking: Reported on 03/12/2016 01/31/16   Leland HerElsia J Yoo, DO  zolpidem (AMBIEN) 5 MG tablet Take 1 tablet (5 mg total) by mouth at bedtime as needed for sleep. Patient not taking: Reported on 03/12/2016 01/28/16   Lorne SkeensNicholas Michael Schenk, MD    Family History Family History  Problem Relation Age of Onset  . Neurofibromatosis Mother   . Neurofibromatosis Sister     Social History Social History  Substance Use Topics  . Smoking status: Never Smoker  . Smokeless tobacco: Never Used  . Alcohol use No     Allergies   Patient has no known allergies.   Review of Systems Review of Systems  Constitutional: Negative for chills and fever.  HENT: Negative for congestion, facial swelling, rhinorrhea and sore throat.   Eyes: Positive for photophobia, pain, discharge, redness and visual disturbance.  Respiratory: Negative for cough and shortness of breath.   Cardiovascular: Negative for chest pain.  Gastrointestinal: Negative for abdominal pain, nausea and vomiting.  Genitourinary: Negative for dysuria.  Musculoskeletal: Negative for back pain.  Skin: Negative for rash and wound.  Neurological: Negative for headaches.  Psychiatric/Behavioral: The patient is not nervous/anxious.      Physical Exam  Updated Vital Signs BP 119/86 (BP Location: Right Arm)   Pulse 94   Temp 98 F (36.7 C) (Oral)   Resp 20   Ht 5\' 1"  (1.549 m)   Wt 43.2 kg   LMP 04/13/2016   SpO2 100%   BMI 18.00 kg/m   Physical Exam  Constitutional: She appears well-developed and well-nourished. No distress.  HENT:  Head: Normocephalic and atraumatic.  Mouth/Throat: Oropharynx is clear and moist. No oropharyngeal exudate.  Eyes: Pupils are equal, round, and reactive to light. Right eye  exhibits no discharge. Left conjunctiva is injected. No scleral icterus.  No foreign bodies noted. Left conjunctiva injected. No tenderness surrounding left orbit. EOM intact without pain. No corneal abrasion seen on fluorescein stain exam.   Neck: Normal range of motion. Neck supple. No thyromegaly present.  Cardiovascular: Normal rate, regular rhythm and normal heart sounds.  Exam reveals no gallop and no friction rub.   No murmur heard. Pulmonary/Chest: Effort normal and breath sounds normal. No stridor. No respiratory distress. She has no wheezes. She has no rales.  Abdominal: Soft. Bowel sounds are normal. She exhibits no distension. There is no tenderness. There is no rebound and no guarding.  Musculoskeletal: She exhibits no edema.  Lymphadenopathy:    She has no cervical adenopathy.  Neurological: She is alert. Coordination normal.  Skin: Skin is warm and dry. No rash noted. She is not diaphoretic. No pallor.  Psychiatric: She has a normal mood and affect.  Nursing note and vitals reviewed.    ED Treatments / Results   DIAGNOSTIC STUDIES: Oxygen Saturation is 100% on RA, normal by my interpretation.    COORDINATION OF CARE: 9:43 PM Discussed treatment plan with pt at bedside which includes antibiotic eye drops and pt agreed to plan.    Labs (all labs ordered are listed, but only abnormal results are displayed) Labs Reviewed - No data to display  EKG  EKG Interpretation None       Radiology No results found.  Procedures Procedures (including critical care time)  Medications Ordered in ED Medications  fluorescein ophthalmic strip 1 strip (1 strip Left Eye Given 04/16/16 2157)  tetracaine (PONTOCAINE) 0.5 % ophthalmic solution 2 drop (2 drops Left Eye Given 04/16/16 2157)  erythromycin ophthalmic ointment 1 application (1 application Left Eye Given 04/16/16 2233)     Initial Impression / Assessment and Plan / ED Course  I have reviewed the triage vital signs and  the nursing notes.  Pertinent labs & imaging results that were available during my care of the patient were reviewed by me and considered in my medical decision making (see chart for details).  Clinical Course     Patient presentation consistent with conjunctivitis.  No evidence of corneal abrasions, entrapment, consensual photophobia, or herpes keratitis.  Presentation not concerning for iritis, or corneal abrasions.  Pt discharged with Erythromycin ointment.  Personal hygiene and frequent handwashing discussed.  Patient advised to follow up with eye doctor in 2 days for recheck. Return precautions discussed.  Patient verbalizes understanding and is agreeable with discharge. Patient vitals stable throughout ED course and discharged in satisfactory condition.  Final Clinical Impressions(s) / ED Diagnoses   Final diagnoses:  Acute conjunctivitis of left eye, unspecified acute conjunctivitis type    New Prescriptions New Prescriptions   No medications on file    I personally performed the services described in this documentation, which was scribed in my presence. The recorded information has been reviewed and is accurate.  Emi Holes, PA-C 04/16/16 2243    Jerelyn Scott, MD 04/16/16 2245

## 2016-04-16 NOTE — ED Triage Notes (Signed)
Pt. presents with left eye redness and drainage onset yesterday , no blurred vision .

## 2016-04-20 ENCOUNTER — Emergency Department (HOSPITAL_COMMUNITY)
Admission: EM | Admit: 2016-04-20 | Discharge: 2016-04-20 | Disposition: A | Discharge status: Home / Self Care | Payer: Medicaid Other | Attending: Emergency Medicine | Admitting: Emergency Medicine

## 2016-04-20 ENCOUNTER — Encounter (HOSPITAL_COMMUNITY): Payer: Self-pay | Admitting: Nurse Practitioner

## 2016-04-20 DIAGNOSIS — Z79899 Other long term (current) drug therapy: Secondary | ICD-10-CM | POA: Insufficient documentation

## 2016-04-20 DIAGNOSIS — H578 Other specified disorders of eye and adnexa: Secondary | ICD-10-CM | POA: Diagnosis present

## 2016-04-20 DIAGNOSIS — H1032 Unspecified acute conjunctivitis, left eye: Secondary | ICD-10-CM

## 2016-04-20 MED ORDER — POLYMYXIN B-TRIMETHOPRIM 10000-0.1 UNIT/ML-% OP SOLN: 1.0000 [drp] | OPHTHALMIC | 0 refills | Status: DC

## 2016-04-20 NOTE — ED Provider Notes (Signed)
[facility] Provider Note   CSN: [id number] Arrival date & time: 04/20/16  1219  By signing my name below, I, Danielle Bradley, attest that this documentation has been prepared under the direction and in the presence of Danielle Piliyler Jannah Guardiola, PA-C. Electronically Signed: Placido SouLogan Bradley, ED Scribe. 04/20/16. 1:17 PM.   History   Chief Complaint Chief Complaint  Patient presents with  . Eye Problem    HPI HPI Comments: Danielle Bradley is a 20 y.o. female who presents to the Emergency Department complaining of persistent, moderate, left eye redness and irritation x 5 days. Pt was seen four days ago with left eye irritation, was dx with conjunctivitis and d/c with erythromycin ointment. She states she began the ointment three days ago and has applied it four times per day. Pt reports mild pain when palpating her eye as well as intermittent itchiness and blurry vision in her left eye. She does not take any regular medications and is otherwise healthy. She denies fevers, chills or other associated symptoms at this time.   The history is provided by the patient and medical records. No language interpreter was used.    Past Medical History:  Diagnosis Date  . 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 NO PAST SURGERIES      OB History    Gravida Para Term Preterm AB Living   1 1 1     1    SAB TAB Ectopic Multiple Live Births         0 1       Home Medications    Prior to Admission medications   Medication Sig Start Date End Date Taking? Authorizing Provider  acetaminophen (TYLENOL) 325 MG tablet Take 650 mg by mouth every 6 (six) hours as needed for moderate pain.    Historical Provider, MD  ibuprofen (ADVIL,MOTRIN) 600 MG tablet Take 1 tablet (600 mg total) by mouth every 6 (six) hours. Patient not  taking: Reported on 03/12/2016 01/30/16   Leland HerElsia J Yoo, DO  Prenatal Vit-Fe Fumarate-FA (MULTIVITAMIN-PRENATAL) 27-0.8 MG TABS tablet Take 1 tablet by mouth daily at 12 noon.    Historical Provider, MD  senna-docusate (SENOKOT-S) 8.6-50 MG tablet Take 2 tablets by mouth daily. Patient not taking: Reported on 03/12/2016 01/31/16   Leland HerElsia J Yoo, DO  zolpidem (AMBIEN) 5 MG tablet Take 1 tablet (5 mg total) by mouth at bedtime as needed for sleep. Patient not taking: Reported on 03/12/2016 01/28/16   Lorne SkeensNicholas Michael Schenk, MD    Family History Family History  Problem Relation Age of Onset  . Neurofibromatosis Mother   . Neurofibromatosis Sister     Social History Social History  Substance Use Topics  . Smoking status: Never Smoker  . Smokeless tobacco: Never Used  . Alcohol use No     Allergies   Patient has no known allergies.   Review of Systems Review of Systems  Constitutional: Negative for chills and fever.  Eyes: Positive for pain, redness, itching and visual disturbance.   Physical Exam Updated Vital Signs BP 125/73 (BP Location: Right Arm)   Pulse 102   Temp 99 F (37.2 C) (Oral)   Resp 18   Ht 5\' 1"  (1.549 m)   Wt 95 lb 9.6 oz (43.4 kg)   LMP 04/13/2016   SpO2 98%  BMI 18.06 kg/m   Physical Exam  Constitutional: She is oriented to person, place, and time. Vital signs are normal. She appears well-developed and well-nourished.  HENT:  Head: Normocephalic and atraumatic.  Right Ear: Hearing normal.  Left Ear: Hearing normal.  Eyes: EOM and lids are normal. Pupils are equal, round, and reactive to light. Lids are everted and swept, no foreign bodies found. Left eye exhibits no discharge. No foreign body present in the left eye. Left conjunctiva is injected. Left eye exhibits normal extraocular motion and no nystagmus.  Neck: Normal range of motion.  Cardiovascular: Normal rate and regular rhythm.   Pulmonary/Chest: Effort normal. No respiratory distress.    Abdominal: Soft.  Musculoskeletal: Normal range of motion.  Neurological: She is alert and oriented to person, place, and time.  Skin: Skin is warm and dry.  Psychiatric: She has a normal mood and affect. Her speech is normal and behavior is normal. Thought content normal.  Nursing note and vitals reviewed.  ED Treatments / Results  Labs (all labs ordered are listed, but only abnormal results are displayed) Labs Reviewed - No data to display  EKG  EKG Interpretation None       Radiology No results found.  Procedures Procedures  DIAGNOSTIC STUDIES: Oxygen Saturation is 100% on RA, normal by my interpretation.    COORDINATION OF CARE: 1:16 PM Discussed next steps with pt. Pt verbalized understanding and is agreeable with the plan.    Medications Ordered in ED Medications - No data to display   Initial Impression / Assessment and Plan / ED Course  I have reviewed the triage vital signs and the nursing notes.  Pertinent labs & imaging results that were available during my care of the patient were reviewed by me and considered in my medical decision making (see chart for details).  Clinical Course    Final Clinical Impressions(s) / ED Diagnoses   {I have reviewed the relevant previous healthcare records. {I obtained HPI from historian.   ED Course:  Assessment: Pt is a 20yF who presents with left eye conjunctivitis. Seen on Friday for same with minimal improvement. No evidence of corneal abrasions, entrapment, consensual photophobia, or herpes keratitis.  Presentation not concerning for iritis, or corneal abrasions.  No periorbital erythema or edema. No pain with EOM. Pt discharged with Polytrim.  Personal hygiene and frequent handwashing discussed.  Patient advised to follow up with ophthalmologist if symptoms persist or worsen. Return precautions discussed.  Patient verbalizes understanding and is agreeable with discharge.  Disposition/Plan:  DC Home Additional Verbal  discharge instructions given and discussed with patient.  Pt Instructed to f/u with Optho in the next week for evaluation and treatment of symptoms. Return precautions given Pt acknowledges and agrees with plan  Supervising Physician Linwood DibblesJon Knapp, MD  Final diagnoses:  Acute conjunctivitis of left eye, unspecified acute conjunctivitis type    New Prescriptions New Prescriptions   No medications on file     Danielle Piliyler Sherran Margolis, PA-C 04/20/16 1525    Linwood DibblesJon Knapp, MD 04/21/16 1257

## 2016-04-20 NOTE — ED Notes (Signed)
ED Provider at bedside. 

## 2016-04-20 NOTE — ED Triage Notes (Signed)
Pt presents with c/o L eye problem. She was seen here Friday and dx with conjunctivitis. She was sent home with erythromycin ointment which she has been using as prescribed but symptoms have not improved. She continues to have swelling, redness, itching to L eye

## 2016-04-20 NOTE — Discharge Instructions (Signed)
Please read and follow all provided instructions.  Your diagnoses today include:  1. Acute conjunctivitis of left eye, unspecified acute conjunctivitis type     Tests performed today include: Vital signs. See below for your results today.   Medications prescribed:  Take as prescribed   Home care instructions:  Follow any educational materials contained in this packet.  Follow-up instructions: Please follow-up with your Opthalmology for further evaluation of symptoms and treatment   Return instructions:  Please return to the Emergency Department if you do not get better, if you get worse, or new symptoms OR  - Fever (temperature greater than 101.41F)  - Bleeding that does not stop with holding pressure to the area    -Severe pain (please note that you may be more sore the day after your accident)  - Chest Pain  - Difficulty breathing  - Severe nausea or vomiting  - Inability to tolerate food and liquids  - Passing out  - Skin becoming red around your wounds  - Change in mental status (confusion or lethargy)  - New numbness or weakness    Please return if you have any other emergent concerns.  Additional Information:  Your vital signs today were: BP 125/73 (BP Location: Right Arm)    Pulse 102    Temp 99 F (37.2 C) (Oral)    Resp 18    Ht 5\' 1"  (1.549 m)    Wt 43.4 kg    LMP 04/13/2016    SpO2 98%    BMI 18.06 kg/m  If your blood pressure (BP) was elevated above 135/85 this visit, please have this repeated by your doctor within one month. ---------------

## 2016-06-01 ENCOUNTER — Ambulatory Visit (INDEPENDENT_AMBULATORY_CARE_PROVIDER_SITE_OTHER): Payer: Medicaid Other | Admitting: Pediatrics

## 2016-06-01 ENCOUNTER — Ambulatory Visit: Payer: Medicaid Other

## 2016-06-01 VITALS — BP 113/62

## 2016-06-01 DIAGNOSIS — Z820 Family history of epilepsy and other diseases of the nervous system: Secondary | ICD-10-CM

## 2016-06-01 DIAGNOSIS — Q8501 Neurofibromatosis, type 1: Secondary | ICD-10-CM

## 2016-06-01 NOTE — Progress Notes (Signed)
   [facility]ediatric Teaching [facility] [address] [place]  [place] [id number] [phone] [phone]  Danielle Bradley DOB: Nov 04, 1995 Date of Evaluation: June 01, 2016  MEDICAL GENETICS CONSULTATION [facility]  Danielle Bradley is a 21 year old seen in follow-up.  Danielle Bradley 334 month old son, Danielle Bradley, was also seen today in genetics clinic.   Danielle Bradley [facility] in November 2015 when she was 21 years of age.  Danielle Bradley has a diagnosis of neurofibromatosis type I (NF1) based on early clinical features and family history.  Danielle Bradley has now had a pregnancy with the delivery of Danielle Bradley four months ago.  Danielle Bradley also has a diagnosis of NF1.   Since the last visit, Danielle Bradley reports that during her pregnancy she noticed at least three new swelling/bumps. There have not been visual changes.  Initial prenatal care did not begin until [redacted] weeks gestation.   Danielle Bradley wears eyeglasses.   There has been an ED visit for conjunctivitis.  There was also treatment of a dental abscess 5 months ago. There was a spontaneous abortion in December 2016.   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  Depo Provera is birth control of choice.     FAMILY HISTORY: We have recently evaluated Danielle Bradley maternal half-sister Danielle Bradley who is doing well.   Physical Examination: thin, well-appearing.  BP 113/62    Head/facies    Normally shaped head.   Neuro Normal tone and strength; no tremor.   Skin/Integument Multiple cafe au lait macules generally distributed.  Palpable nodules abdomen supraumbilical and right lower arm.  There is no pain with palpation   ASSESSMENT: Danielle Bradley is a 21 year old with NF1 and a family history of NF1.  There are new neurofibromas associated with a recent pregnancy.  Her 394 month old son also has a diagnosis of NF1.  Genetic counselor, [name], and I and genetic counseling student, [name], discussed the importance of medical follow-up for herself and Danielle Bradley.   We discussed the importance of early prenatal care with any pregnancies.  We will plan to see Danielle Bradley again in medical genetics clinic when we have Danielle Bradley return for follow-up.   Link SnufferPamela J. [name], M.D., Ph.D. Clinical Professor, Pediatrics and Medical Genetics

## 2016-06-02 ENCOUNTER — Ambulatory Visit: Payer: Medicaid Other

## 2016-06-02 MED ORDER — MEDROXYPROGESTERONE ACETATE 150 MG/ML IM SUSY: 1.0000 mL | PREFILLED_SYRINGE | Freq: Once | INTRAMUSCULAR | 3 refills | Status: DC

## 2016-06-02 NOTE — Progress Notes (Signed)
Nurse visit for Depo. Office supply Depo was used in Nov. Per provider notes in Nov, pt was to f/u 3 mos for Depo but rx was not sent to the pharmacy so pt did not bring Depo to the appt today. Rx sent today. Pt unsure if she will rto for the injection; she said she may ask family member to give Depo shot. Depo calender given to pt.

## 2016-06-10 ENCOUNTER — Encounter: Payer: Self-pay | Admitting: Pediatrics

## 2016-07-24 ENCOUNTER — Emergency Department (HOSPITAL_COMMUNITY)
Admission: EM | Admit: 2016-07-24 | Discharge: 2016-07-24 | Disposition: A | Discharge status: Home / Self Care | Payer: Medicaid Other | Attending: Emergency Medicine | Admitting: Emergency Medicine

## 2016-07-24 ENCOUNTER — Encounter (HOSPITAL_COMMUNITY): Payer: Self-pay | Admitting: *Deleted

## 2016-07-24 DIAGNOSIS — R05 Cough: Secondary | ICD-10-CM | POA: Diagnosis present

## 2016-07-24 DIAGNOSIS — J069 Acute upper respiratory infection, unspecified: Secondary | ICD-10-CM | POA: Diagnosis not present

## 2016-07-24 DIAGNOSIS — B9789 Other viral agents as the cause of diseases classified elsewhere: Secondary | ICD-10-CM

## 2016-07-24 DIAGNOSIS — Z79899 Other long term (current) drug therapy: Secondary | ICD-10-CM | POA: Diagnosis not present

## 2016-07-24 MED ORDER — DM-GUAIFENESIN ER 30-600 MG PO TB12: 1.0000 | ORAL_TABLET | Freq: Two times a day (BID) | ORAL | 0 refills | Status: DC | PRN

## 2016-07-24 NOTE — ED Provider Notes (Signed)
[facility]-EMERGENCY DEPT Provider Note   CSN: [id number] Arrival date & time: 07/24/16  1635  By signing my name below, I, Danielle NeighborsMaurice Deon Copeland Jr., attest that this documentation has been prepared under the direction and in the presence of Danielle Pulleyaniel Chanelle Hodsdon, MD. Electronically signed: [name], ED Scribe. 07/24/16. 4:54 PM.   History   Chief Complaint Chief Complaint  Patient presents with  . Cough    HPI  Danielle Bradley is a 21 y.o. female who presents to the Emergency Department complaining of cough with onset x1 day. Pt is here with son who has similar symptoms. Pt states that she developed a cough with associated headache, fever and myalgia x1 day ago. She has reportedly tried drinking water and orange juice with some relief. She denies congestion, appetite change.  The history is provided by the patient. No language interpreter was used.    Past Medical History:  Diagnosis Date  . Neurofibromatosis, type 1 [facility](HCC)     Patient Active Problem List   Diagnosis Date Noted  . 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 NO PAST SURGERIES      OB History    Gravida Para Term Preterm AB Living   1 1 1     1    SAB TAB Ectopic Multiple Live Births         0 1       Home Medications    Prior to Admission medications   Medication Sig Start Date End Date Taking? Authorizing Provider  acetaminophen (TYLENOL) 325 MG tablet Take 650 mg by mouth every 6 (six) hours as needed for moderate pain.    Historical Provider, MD  ibuprofen (ADVIL,MOTRIN) 600 MG tablet Take 1 tablet (600 mg total) by mouth every 6 (six) hours. Patient not taking: Reported on 03/12/2016 01/30/16   Leland HerElsia J Yoo, DO  MedroxyPROGESTERone Acetate 150 MG/ML SUSY Inject 1 mL (150 mg total) into the muscle once. 06/02/16 06/02/16  Tereso NewcomerUgonna A Anyanwu, MD  Prenatal Vit-Fe Fumarate-FA (MULTIVITAMIN-PRENATAL) 27-0.8 MG TABS  tablet Take 1 tablet by mouth daily at 12 noon.    Historical Provider, MD  senna-docusate (SENOKOT-S) 8.6-50 MG tablet Take 2 tablets by mouth daily. Patient not taking: Reported on 03/12/2016 01/31/16   Leland HerElsia J Yoo, DO  trimethoprim-polymyxin b (POLYTRIM) ophthalmic solution Place 1 drop into the left eye every 4 (four) hours. For 5-7 days Patient not taking: Reported on 06/02/2016 04/20/16   Audry Piliyler Mohr, PA-C  zolpidem (AMBIEN) 5 MG tablet Take 1 tablet (5 mg total) by mouth at bedtime as needed for sleep. Patient not taking: Reported on 03/12/2016 01/28/16   Lorne SkeensNicholas Michael Schenk, MD    Family History Family History  Problem Relation Age of Onset  . Neurofibromatosis Mother   . Neurofibromatosis Sister     Social History Social History  Substance Use Topics  . Smoking status: Never Smoker  . Smokeless tobacco: Never Used  . Alcohol use No     Allergies   Patient has no known allergies.   Review of Systems Review of Systems  Constitutional: Positive for fever. Negative for appetite change.  HENT: Negative for congestion.   Respiratory: Positive for cough.   Musculoskeletal: Positive for myalgias.  Neurological: Positive for headaches.  All other systems reviewed and are negative.    Physical Exam Updated Vital Signs BP 122/83 (BP Location: Right Arm)   Pulse (!) 113  Temp 97.5 F (36.4 C) (Oral)   Resp (!) 22   SpO2 100%   Physical Exam  Constitutional: She is oriented to person, place, and time. She appears well-developed and well-nourished. No distress.  HENT:  Head: Normocephalic.  Nose: Nose normal.  Eyes: Conjunctivae are normal.  Neck: Neck supple. No tracheal deviation present.  Cardiovascular: Normal rate and regular rhythm.   No murmur heard. Pulmonary/Chest: Effort normal and breath sounds normal. No respiratory distress. She has no wheezes. She has no rales.  Abdominal: Soft. She exhibits no distension.  Neurological: She is alert and oriented to  person, place, and time.  Skin: Skin is warm and dry.  Psychiatric: She has a normal mood and affect.     ED Treatments / Results   DIAGNOSTIC STUDIES: Oxygen Saturation is 100% on RA, normal by my interpretation.   COORDINATION OF CARE: 4:54 PM-Discussed next steps with pt. Pt verbalized understanding and is agreeable with the plan.    Labs (all labs ordered are listed, but only abnormal results are displayed) Labs Reviewed - No data to display  EKG  EKG Interpretation None       Radiology No results found.  Procedures Procedures (including critical care time)  Medications Ordered in ED Medications - No data to display   Initial Impression / Assessment and Plan / ED Course  I have reviewed the triage vital signs and the nursing notes.  Pertinent labs & imaging results that were available during my care of the patient were reviewed by me and considered in my medical decision making (see chart for details).     21 y.o. female presents with cough and chills for last day. Son has same symptoms preceding. No signs of respiratory distress, non-toxic appearing, CTAB, no concern for pneumonia with this clinical picture. No emergent testing indicated at this time. Pt discharged with likely viral cough which will be self limited in its course. Plan to follow up with PCP as needed and return precautions discussed for worsening or new concerning symptoms.   Final Clinical Impressions(s) / ED Diagnoses   Final diagnoses:  Viral URI with cough    New Prescriptions Discharge Medication List as of 07/24/2016  5:10 PM    START taking these medications   Details  dextromethorphan-guaiFENesin (MUCINEX DM) 30-600 MG 12hr tablet Take 1 tablet by mouth 2 (two) times daily as needed for cough., Starting Sat 07/24/2016, Print       I personally performed the services described in this documentation, which was scribed in my presence. The recorded information has been reviewed and  is accurate.    Danielle Pulley, MD 07/24/16 [phone]

## 2016-07-24 NOTE — ED Triage Notes (Signed)
Pt has been sick since yesterday with a cough.  Pt says her throat hurts when she coughs.  She has had a headache, says she felt warm at home but didn't take her temp. She has had some post-tussive emesis as well. She reports normal eating and drinking. Says she took a cold and flu OTC med about 2pm.

## 2016-09-27 ENCOUNTER — Telehealth: Payer: Self-pay

## 2016-09-27 DIAGNOSIS — Z308 Encounter for other contraceptive management: Secondary | ICD-10-CM

## 2016-09-27 MED ORDER — NORGESTIMATE-ETH ESTRADIOL 0.25-35 MG-MCG PO TABS: 1.0000 | ORAL_TABLET | Freq: Every day | ORAL | 11 refills | Status: DC

## 2016-09-27 NOTE — Telephone Encounter (Signed)
Pt is requesting that her birth control be switched from the depo shot back to the pill due to daily bleeding with the depo.

## 2016-09-27 NOTE — Addendum Note (Signed)
Addended by: Jaynie CollinsANYANWU, Jamiah Homeyer A on: 09/27/2016 03:30 PM   Modules accepted: Orders

## 2016-09-27 NOTE — Telephone Encounter (Signed)
Sprintec was prescribed.  Please call and tell patient to pick up prescription.  Jaynie CollinsUgonna Anyanwu, MD

## 2016-09-29 NOTE — Telephone Encounter (Signed)
Pt informed that rx has been sent to the pharmacy. Pt is having irregular bleeding, and states that she has not been sexually active. Advised pt that she can start pills now.

## 2017-02-17 DIAGNOSIS — Z79899 Other long term (current) drug therapy: Secondary | ICD-10-CM | POA: Insufficient documentation

## 2017-02-17 DIAGNOSIS — G44209 Tension-type headache, unspecified, not intractable: Secondary | ICD-10-CM | POA: Insufficient documentation

## 2017-02-17 DIAGNOSIS — M791 Myalgia, unspecified site: Secondary | ICD-10-CM | POA: Diagnosis present

## 2017-02-17 DIAGNOSIS — Y9389 Activity, other specified: Secondary | ICD-10-CM | POA: Diagnosis not present

## 2017-02-17 DIAGNOSIS — Y999 Unspecified external cause status: Secondary | ICD-10-CM | POA: Diagnosis not present

## 2017-02-17 DIAGNOSIS — Y92410 Unspecified street and highway as the place of occurrence of the external cause: Secondary | ICD-10-CM | POA: Insufficient documentation

## 2017-02-18 ENCOUNTER — Encounter (HOSPITAL_COMMUNITY): Payer: Self-pay

## 2017-02-18 ENCOUNTER — Emergency Department (HOSPITAL_COMMUNITY)
Admission: EM | Admit: 2017-02-18 | Discharge: 2017-02-18 | Disposition: A | Discharge status: Home / Self Care | Payer: No Typology Code available for payment source | Attending: Emergency Medicine | Admitting: Emergency Medicine

## 2017-02-18 DIAGNOSIS — M791 Myalgia, unspecified site: Secondary | ICD-10-CM

## 2017-02-18 DIAGNOSIS — G44209 Tension-type headache, unspecified, not intractable: Secondary | ICD-10-CM
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
## 2017-02-18 NOTE — Discharge Instructions (Signed)
We advised the use of Tylenol or ibuprofen for headache or muscle soreness. Ice areas of pain to limit swelling and improve soreness. You may return to the emergency department for new or concerning symptoms.
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
## 2017-04-23 ENCOUNTER — Other Ambulatory Visit: Payer: Self-pay

## 2017-04-23 ENCOUNTER — Emergency Department (HOSPITAL_COMMUNITY)
Admission: EM | Admit: 2017-04-23 | Discharge: 2017-04-23 | Disposition: A | Discharge status: Home / Self Care | Payer: Medicaid Other | Attending: Emergency Medicine | Admitting: Emergency Medicine

## 2017-04-23 ENCOUNTER — Encounter (HOSPITAL_COMMUNITY): Payer: Self-pay

## 2017-04-23 DIAGNOSIS — J111 Influenza due to unidentified influenza virus with other respiratory manifestations: Secondary | ICD-10-CM | POA: Diagnosis not present

## 2017-04-23 DIAGNOSIS — R69 Illness, unspecified: Secondary | ICD-10-CM

## 2017-04-23 DIAGNOSIS — Z79899 Other long term (current) drug therapy: Secondary | ICD-10-CM | POA: Insufficient documentation

## 2017-04-23 DIAGNOSIS — Q8501 Neurofibromatosis, type 1: Secondary | ICD-10-CM | POA: Insufficient documentation

## 2017-04-23 DIAGNOSIS — M791 Myalgia, unspecified site: Secondary | ICD-10-CM | POA: Diagnosis present

## 2017-04-23 MED ORDER — OSELTAMIVIR PHOSPHATE 75 MG PO CAPS: 75.0000 mg | ORAL_CAPSULE | Freq: Two times a day (BID) | ORAL | 0 refills | Status: DC

## 2017-04-23 MED ORDER — ONDANSETRON 4 MG PO TBDP
4.0000 mg | ORAL_TABLET | Freq: Three times a day (TID) | ORAL | 0 refills | Status: DC | PRN
Start: 2017-04-23 — End: 2017-06-04

## 2017-04-23 MED ORDER — IBUPROFEN 800 MG PO TABS
800.0000 mg | ORAL_TABLET | Freq: Once | ORAL | Status: AC
  Administered 2017-04-23: 800 mg via ORAL
  Filled 2017-04-23: qty 1

## 2017-04-23 NOTE — ED Notes (Signed)
VSS, pt ambulatory to lobby with steady gait. Verbalized understanding d/c instructions. All belongings with pt on d/c. Unable to sign, no signature pad on computer on wheels

## 2017-04-23 NOTE — ED Notes (Signed)
See provider assessment 

## 2017-04-23 NOTE — ED Provider Notes (Signed)
[facility] Note   CSN: [id number] Arrival date & time: 04/23/17  1325     History   Chief Complaint Chief Complaint  Patient presents with  . URI    HPI Danielle Bradley is a 21 y.o. female.  The history is provided by the patient and medical records. No language interpreter was used.  URI   Associated symptoms include congestion, sore throat and cough. Pertinent negatives include no chest pain, no abdominal pain, no diarrhea, no nausea, no vomiting and no dysuria.   Danielle Bradley is a 21 y.o. female  with a PMH of neurofibromatosis who presents to the Emergency Department complaining of generalized body aches which began late last night/early this morning.  Associated with sore throat, headache and dry cough.  Her boyfriend and boyfriend's child were seen yesterday in the ER.  Child was diagnosed with influenza and per patient, tested with positive result.  She was started on Tamiflu.  Patient was not seen yesterday as she did not feel sick until after they were home.  She is concerned she may also have the flu.  No medications taken prior to arrival for symptoms.  Denies fever, but does feel hot, then cold.  No abdominal pain, nausea, vomiting, back pain, dysuria, chest pain or trouble breathing.   Past Medical History:  Diagnosis Date  . 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 NO PAST SURGERIES      OB History    Gravida Para Term Preterm AB Living   1 1 1     1    SAB TAB Ectopic Multiple Live Births         0 1       Home Medications    Prior to Admission medications   Medication Sig Start Date End Date Taking? Authorizing Provider  dextromethorphan-guaiFENesin (MUCINEX DM) 30-600 MG 12hr tablet Take 1 tablet by mouth 2 (two) times  daily as needed for cough. Patient not taking: Reported on 02/18/2017 07/24/16   Lyndal PulleyKnott, Daniel, MD  ibuprofen (ADVIL,MOTRIN) 600 MG tablet Take 1 tablet (600 mg total) by mouth every 6 (six) hours. Patient not taking: Reported on 03/12/2016 01/30/16   Leland HerYoo, Elsia J, DO  MedroxyPROGESTERone Acetate 150 MG/ML SUSY Inject 1 mL (150 mg total) into the muscle once. 06/02/16 06/02/16  Tereso NewcomerAnyanwu, Ugonna A, MD  norgestimate-ethinyl estradiol (ORTHO-CYCLEN,SPRINTEC,PREVIFEM) 0.25-35 MG-MCG tablet Take 1 tablet by mouth daily. Patient not taking: Reported on 02/18/2017 09/27/16   Anyanwu, Jethro BastosUgonna A, MD  Prenatal Vit-Fe Fumarate-FA (MULTIVITAMIN-PRENATAL) 27-0.8 MG TABS tablet Take 1 tablet by mouth daily at 12 noon.    [provider]  senna-docusate (SENOKOT-S) 8.6-50 MG tablet Take 2 tablets by mouth daily. Patient not taking: Reported on 03/12/2016 01/31/16   Leland HerYoo, Elsia J, DO  trimethoprim-polymyxin b (POLYTRIM) ophthalmic solution Place 1 drop into the left eye every 4 (four) hours. For 5-7 days Patient not taking: Reported on 06/02/2016 04/20/16   Audry PiliMohr, Tyler, PA-C  zolpidem (AMBIEN) 5 MG tablet Take 1 tablet (5 mg total) by mouth at bedtime as needed for sleep. Patient not taking: Reported on 03/12/2016 01/28/16   Lorne SkeensSchenk, Nicholas Michael, MD    Family History Family History  Problem Relation Age of Onset  . Neurofibromatosis Mother   . Neurofibromatosis Sister  Social History Social History   Tobacco Use  . Smoking status: Never Smoker  . Smokeless tobacco: Never Used  Substance Use Topics  . Alcohol use: No    Alcohol/week: 0.0 oz  . Drug use: No     Allergies   Patient has no known allergies.   Review of Systems Review of Systems  Constitutional: Negative for fever.  HENT: Positive for congestion and sore throat. Negative for trouble swallowing.   Respiratory: Positive for cough. Negative for shortness of breath.   Cardiovascular: Negative for chest pain.  Gastrointestinal:  Negative for abdominal pain, diarrhea, nausea and vomiting.  Genitourinary: Negative for dysuria and vaginal discharge.  Musculoskeletal: Positive for myalgias.  Skin: Negative for color change.     Physical Exam Updated Vital Signs BP 119/71 (BP Location: Right Arm)   Pulse (!) 123   Temp 98.9 F (37.2 C) (Oral)   Ht 5' (1.524 m)   Wt 44.5 kg (98 lb)   LMP 03/20/2017   SpO2 97%   BMI 19.14 kg/m   Physical Exam  Constitutional: She is oriented to person, place, and time. She appears well-developed and well-nourished. No distress.  HENT:  Head: Normocephalic and atraumatic.  OP with erythema, no exudates or tonsillar hypertrophy. + nasal congestion with mucosal edema.   Neck: Normal range of motion. Neck supple.  No meningeal signs.   Cardiovascular: Normal heart sounds.  Regular rate and rhythm on exam.  Pulmonary/Chest: Effort normal.  Lungs are clear to auscultation bilaterally - no w/r/r  Abdominal: Soft. She exhibits no distension. There is no tenderness.  Musculoskeletal: Normal range of motion.  Neurological: She is alert and oriented to person, place, and time.  Skin: Skin is warm and dry. She is not diaphoretic.  Nursing note and vitals reviewed.    ED Treatments / Results  Labs (all labs ordered are listed, but only abnormal results are displayed) Labs Reviewed - No data to display  EKG  EKG Interpretation None       Radiology No results found.  Procedures Procedures (including critical care time)  Medications Ordered in ED Medications - No data to display   Initial Impression / Assessment and Plan / ED Course  I have reviewed the triage vital signs and the nursing notes.  Pertinent labs & imaging results that were available during my care of the patient were reviewed by me and considered in my medical decision making (see chart for details).    Danielle Bradley is a 21 y.o. female who presents to ED for influenza-like-illness.  Boyfriends  child was seen in the ER yesterday and tested positive for the flu.  On exam, patient afebrile, hemodynamically stable with a clear lung exam. No signs of dehydration and tolerating PO well in ED today.  Discussed the risks and benefits of Tamiflu treatment with the patient.  She would like to receive treatment.  Rx provided.Patient discharged home with instructions to orally hydrate, rest, and use over-the-counter medications such as anti-inflammatories ibuprofen and Aleve for muscle aches and Tylenol for fever. Rx for Zofran as needed nausea given.   PCP follow up strongly encouraged. Reasons to return to ER discussed. All questions answered.  Final Clinical Impressions(s) / ED Diagnoses   Final diagnoses:  Influenza-like illness    ED Discharge Orders    None       Telia Amundson, Chase PicketJaime Pilcher, PA-C 04/23/17 1611    Lorre NickAllen, Anthony, MD 04/26/17 1447

## 2017-04-23 NOTE — ED Notes (Signed)
ED Provider at bedside. 

## 2017-04-23 NOTE — ED Triage Notes (Signed)
Onset 2-3 days NP cough.  Onset last night headache and sore throat.  BF seen last night dx: flu, child is being seen now for fever, vomiting, cough.

## 2017-04-23 NOTE — Discharge Instructions (Signed)
It was my pleasure taking care of you today!  Zofran as needed for nausea. We discussed the risks and benefits of tamiflu. I have provided a prescription for this if you would like to start treatment.  Alternate between Tylenol and ibuprofen as needed for body aches / fevers. Rest, drink plenty of fluids to stay hydrated. Wash your hands often.  Follow up with your doctor in regards to your hospital visit. Return to the emergency department if symptoms worsen, become progressive, or become more concerning.

## 2017-06-04 ENCOUNTER — Encounter (HOSPITAL_COMMUNITY): Payer: Self-pay | Admitting: *Deleted

## 2017-06-04 ENCOUNTER — Other Ambulatory Visit: Payer: Self-pay

## 2017-06-04 ENCOUNTER — Ambulatory Visit (HOSPITAL_COMMUNITY)
Admission: EM | Admit: 2017-06-04 | Discharge: 2017-06-04 | Disposition: A | Discharge status: Home / Self Care | Payer: Medicaid Other | Attending: Family Medicine | Admitting: Family Medicine

## 2017-06-04 DIAGNOSIS — K2900 Acute gastritis without bleeding: Secondary | ICD-10-CM | POA: Diagnosis not present

## 2017-06-04 DIAGNOSIS — R519 Headache, unspecified: Secondary | ICD-10-CM

## 2017-06-04 DIAGNOSIS — R51 Headache: Secondary | ICD-10-CM | POA: Diagnosis not present

## 2017-06-04 DIAGNOSIS — M940 Chondrocostal junction syndrome [Tietze]: Secondary | ICD-10-CM | POA: Diagnosis not present

## 2017-06-04 MED ORDER — ONDANSETRON HCL 4 MG PO TABS: 4.0000 mg | ORAL_TABLET | Freq: Four times a day (QID) | ORAL | 0 refills | Status: DC

## 2017-06-04 MED ORDER — KETOROLAC TROMETHAMINE 60 MG/2ML IM SOLN
60.0000 mg | Freq: Once | INTRAMUSCULAR | Status: AC
  Administered 2017-06-04: 60 mg via INTRAMUSCULAR

## 2017-06-04 MED ORDER — KETOROLAC TROMETHAMINE 60 MG/2ML IM SOLN
INTRAMUSCULAR | Status: AC
  Filled 2017-06-04: qty 2

## 2017-06-04 NOTE — ED Triage Notes (Signed)
C/O chest pain, HA and nausea x 3 days.  Reports worse usually in AM.  Has had some slight cough/congestion.  Denies fevers.  States has had chest pains in past & gone to ED, but "they never really find anything".  C/O frequent HAs.

## 2017-06-04 NOTE — ED Provider Notes (Signed)
[facility]    CSN: [id number] Arrival date & time: 06/04/17  1507     History   Chief Complaint Chief Complaint  Patient presents with  . Chest Pain  . Headache  . Nausea    HPI Danielle Bradley is a 22 y.o. female.   HPI  Duration of issue: 3 days Quality: sharp Palliation: rest Provocation: deep breath, touching Severity: 5/10 Radiation: none Duration of chest pain: a couple hours Associated symptoms: none Cardiac history: none Family heart history: Cousin with open heart surgery Smoker? No  She has a history of chronic headaches.  She has a headache that is not responding to Tylenol or Motrin.  She does not have a PCP.  No numbness, tingling, weakness, difficulty swallowing, trouble with speech, or vision changes.  She has had abdominal pain and nausea over the past 3 days as well.  Her bowel movements are normal.  She denies any fevers.  No recent medication change, recent travel, or sick contacts.  Past Medical History:  Diagnosis Date  . Neurofibromatosis, type 1 [facility](HCC)     Patient Active Problem List   Diagnosis Date Noted  . 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 NO PAST SURGERIES      OB History    Gravida Para Term Preterm AB Living   1 1 1     1    SAB TAB Ectopic Multiple Live Births         0 1       Home Medications    Prior to Admission medications   Medication Sig Start Date End Date Taking? Authorizing Provider  ondansetron (ZOFRAN) 4 MG tablet Take 1 tablet (4 mg total) by mouth every 6 (six) hours. 06/04/17   Sharlene DoryWendling, Ludwig Tugwell Paul, DO  Prenatal Vit-Fe Fumarate-FA (MULTIVITAMIN-PRENATAL) 27-0.8 MG TABS tablet Take 1 tablet by mouth daily at 12 noon.    [provider]    Family History Family History  Problem Relation Age of Onset  . Neurofibromatosis Mother   . Neurofibromatosis Sister     Social History Social  History   Tobacco Use  . Smoking status: Never Smoker  . Smokeless tobacco: Never Used  Substance Use Topics  . Alcohol use: No    Alcohol/week: 0.0 oz  . Drug use: No     Allergies   Patient has no known allergies.   Review of Systems Review of Systems  Respiratory: Negative for shortness of breath.   Cardiovascular: Positive for chest pain.  Neurological: Positive for headaches.     Physical Exam Triage Vital Signs ED Triage Vitals  Enc Vitals Group     BP 06/04/17 1606 119/70     Pulse Rate 06/04/17 1606 74     Resp 06/04/17 1606 16     Temp 06/04/17 1606 98.3 F (36.8 C)     Temp Source 06/04/17 1606 Oral     SpO2 06/04/17 1606 100 %   Updated Vital Signs BP 119/70   Pulse 74   Temp 98.3 F (36.8 C) (Oral)   Resp 16   LMP 05/25/2017 (Exact Date)   SpO2 100%   Breastfeeding? No   Physical Exam  Constitutional: She is oriented to person, place, and time. She appears well-developed and well-nourished.  HENT:  Head: Normocephalic and atraumatic.  Eyes: Pupils are equal, round, and reactive to light.  Cardiovascular: Normal rate and regular rhythm.  No  murmur heard. Pulmonary/Chest: Effort normal and breath sounds normal. No respiratory distress.  Abdominal: Soft. Bowel sounds are normal. She exhibits no distension and no mass. There is no tenderness.  Musculoskeletal:       Left lower leg: She exhibits no edema.  CP reproducible to palpation of sternum around R5-6  Neurological: She is alert and oriented to person, place, and time. She is not disoriented.  Skin: Skin is warm and dry. Capillary refill takes less than 2 seconds.  Psychiatric: She has a normal mood and affect. Her behavior is normal.     UC Treatments / Results  Procedures Procedures none  Medications Ordered in UC Medications  ketorolac (TORADOL) injection 60 mg (60 mg Intramuscular Given 06/04/17 1654)     Initial Impression / Assessment and Plan / UC Course  I have reviewed  the triage vital signs and the nursing notes.  Pertinent labs & imaging results that were available during my care of the patient were reviewed by me and considered in my medical decision making (see chart for details).     22 yo largely healthy F presents with atypical chest pain, headache and nausea/abd pain. The first dx is likely costochondritis, particularly given her neg ED workups in past. Info given regarding that. Headache chronic, toradol today and will have her establish w pcp to manage further. Will offer supportive tx with last dx. F/u prn. Letter for work given stating she may return Monday 06/06/17 or sooner if she is feeling better. Pt voiced understanding and agreement to the plan.   Final Clinical Impressions(s) / UC Diagnoses   Final diagnoses:  Costochondritis  Acute gastritis without hemorrhage, unspecified gastritis type  Nonintractable headache, unspecified chronicity pattern, unspecified headache type    ED Discharge Orders        Ordered    ondansetron (ZOFRAN) 4 MG tablet  Every 6 hours     06/04/17 1638       Controlled Substance Prescriptions [place] Controlled Substance Registry consulted? Not Applicable   Sharlene Dory, [place] 06/04/17 2211

## 2017-07-19 ENCOUNTER — Ambulatory Visit: Payer: Medicaid Other | Admitting: Pediatrics

## 2017-08-02 ENCOUNTER — Ambulatory Visit: Payer: Medicaid Other | Admitting: Pediatrics

## 2017-11-01 ENCOUNTER — Other Ambulatory Visit: Payer: Self-pay | Admitting: Obstetrics & Gynecology

## 2017-11-01 ENCOUNTER — Encounter (HOSPITAL_COMMUNITY): Payer: Self-pay | Admitting: Emergency Medicine

## 2017-11-01 ENCOUNTER — Ambulatory Visit (HOSPITAL_COMMUNITY)
Admission: EM | Admit: 2017-11-01 | Discharge: 2017-11-01 | Disposition: A | Discharge status: Home / Self Care | Payer: BLUE CROSS/BLUE SHIELD | Attending: Family Medicine | Admitting: Family Medicine

## 2017-11-01 DIAGNOSIS — Q8501 Neurofibromatosis, type 1: Secondary | ICD-10-CM | POA: Insufficient documentation

## 2017-11-01 DIAGNOSIS — R0789 Other chest pain: Secondary | ICD-10-CM

## 2017-11-01 DIAGNOSIS — M940 Chondrocostal junction syndrome [Tietze]: Secondary | ICD-10-CM

## 2017-11-01 DIAGNOSIS — R1030 Lower abdominal pain, unspecified: Secondary | ICD-10-CM | POA: Insufficient documentation

## 2017-11-01 DIAGNOSIS — Z3202 Encounter for pregnancy test, result negative: Secondary | ICD-10-CM

## 2017-11-01 DIAGNOSIS — Z792 Long term (current) use of antibiotics: Secondary | ICD-10-CM | POA: Diagnosis not present

## 2017-11-01 DIAGNOSIS — R51 Headache: Secondary | ICD-10-CM | POA: Diagnosis not present

## 2017-11-01 DIAGNOSIS — N898 Other specified noninflammatory disorders of vagina: Secondary | ICD-10-CM | POA: Diagnosis present

## 2017-11-01 DIAGNOSIS — R079 Chest pain, unspecified: Secondary | ICD-10-CM | POA: Diagnosis present

## 2017-11-01 DIAGNOSIS — Z79899 Other long term (current) drug therapy: Secondary | ICD-10-CM | POA: Insufficient documentation

## 2017-11-01 DIAGNOSIS — R519 Headache, unspecified: Secondary | ICD-10-CM

## 2017-11-01 DIAGNOSIS — Z308 Encounter for other contraceptive management: Secondary | ICD-10-CM

## 2017-11-01 LAB — POCT PREGNANCY, URINE: Preg Test, Ur: NEGATIVE

## 2017-11-01 LAB — POCT URINALYSIS DIP (DEVICE)
Bilirubin Urine: NEGATIVE
GLUCOSE, UA: NEGATIVE mg/dL
Hgb urine dipstick: NEGATIVE
KETONES UR: NEGATIVE mg/dL
Nitrite: NEGATIVE
Protein, ur: NEGATIVE mg/dL
SPECIFIC GRAVITY, URINE: 1.02 (ref 1.005–1.030)
Urobilinogen, UA: 0.2 mg/dL (ref 0.0–1.0)
pH: 8.5 — ABNORMAL HIGH (ref 5.0–8.0)

## 2017-11-01 MED ORDER — METRONIDAZOLE 500 MG PO TABS: 500.0000 mg | ORAL_TABLET | Freq: Two times a day (BID) | ORAL | 0 refills | Status: DC

## 2017-11-01 MED ORDER — KETOROLAC TROMETHAMINE 60 MG/2ML IM SOLN
INTRAMUSCULAR | Status: AC
  Filled 2017-11-01: qty 2

## 2017-11-01 MED ORDER — FLUCONAZOLE 150 MG PO TABS: 150.0000 mg | ORAL_TABLET | Freq: Once | ORAL | 0 refills | Status: AC

## 2017-11-01 MED ORDER — NAPROXEN 500 MG PO TABS: 500.0000 mg | ORAL_TABLET | Freq: Two times a day (BID) | ORAL | 0 refills | Status: DC

## 2017-11-01 MED ORDER — KETOROLAC TROMETHAMINE 60 MG/2ML IM SOLN
60.0000 mg | Freq: Once | INTRAMUSCULAR | Status: AC
  Administered 2017-11-01: 60 mg via INTRAMUSCULAR

## 2017-11-01 MED ORDER — ONDANSETRON 4 MG PO TBDP: 4.0000 mg | ORAL_TABLET | Freq: Three times a day (TID) | ORAL | 0 refills | Status: DC | PRN

## 2017-11-01 NOTE — ED Triage Notes (Signed)
Pt reports headache and chest discomfort that started yesterday. CP comes and goes. PT has been evaluated for this before.  PT reports lower abdominal pain for 1-2 weeks.  PT reports vaginal discharge for 1 week.

## 2017-11-01 NOTE — ED Provider Notes (Signed)
[facility]    CSN: [id number] Arrival date & time: 11/01/17  1024     History   Chief Complaint Chief Complaint  Patient presents with  . Vaginal Discharge  . Chest Pain    HPI Danielle Bradley is a 22 y.o. female history of neurofibromatosis presenting today for evaluation of multiple complaints including headache, chest discomfort, abdominal pain and vaginal discharge.  Patient states that she has had chest discomfort to the center of her chest, feels like a tightness sensation.  She has had this for quite a while and chest pain will come and go.  Began yesterday.  Will improve with ibuprofen and Tylenol.  Worsens with deep respirations, denies pain with normal respiration or touch.  Denies associated cough or shortness of breath.  Patient states she has previously been seen in the emergency room for this and work-up has been negative.  States that pain is similar to previous episodes.  Patient has had vaginal discharge for approximately 1 week.  She has also had lower abdominal pain for 1 to 2 weeks.  She has occasional itching, but no persistent irritation.  She denies concern for STDs, but does states she has had a new partner recently.  Has associated nausea, denies vomiting.  Bowel movements have been slightly softer, but no persistent diarrhea.  Denies blood in the stools.  HPI  Past Medical History:  Diagnosis Date  . Neurofibromatosis, type 1 [facility])     Patient Active Problem List   Diagnosis Date Noted  . 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 NO PAST SURGERIES      OB History    Gravida  1   Para  1   Term  1   Preterm      AB      Living  1     SAB      TAB      Ectopic      Multiple  0   Live Births  1            Home Medications    Prior to Admission medications   Medication Sig Start Date End Date Taking? Authorizing Provider    fluconazole (DIFLUCAN) 150 MG tablet Take 1 tablet (150 mg total) by mouth once for 1 dose. 11/01/17 11/01/17  Moksh Loomer C, PA-C  metroNIDAZOLE (FLAGYL) 500 MG tablet Take 1 tablet (500 mg total) by mouth 2 (two) times daily. 11/01/17   Luisantonio Adinolfi C, PA-C  naproxen (NAPROSYN) 500 MG tablet Take 1 tablet (500 mg total) by mouth 2 (two) times daily. 11/01/17   Luciano Cinquemani C, PA-C  ondansetron (ZOFRAN ODT) 4 MG disintegrating tablet Take 1 tablet (4 mg total) by mouth every 8 (eight) hours as needed for nausea or vomiting. 11/01/17   Chanese Hartsough C, PA-C  ondansetron (ZOFRAN) 4 MG tablet Take 1 tablet (4 mg total) by mouth every 6 (six) hours. 06/04/17   Sharlene Dory, DO  Prenatal Vit-Fe Fumarate-FA (MULTIVITAMIN-PRENATAL) 27-0.8 MG TABS tablet Take 1 tablet by mouth daily at 12 noon.    [provider]    Family History Family History  Problem Relation Age of Onset  . Neurofibromatosis Mother   . Neurofibromatosis Sister     Social History Social History   Tobacco Use  . Smoking status: Never Smoker  . Smokeless tobacco: Never Used  Substance Use Topics  . Alcohol  use: No    Alcohol/week: 0.0 oz  . Drug use: No     Allergies   Patient has no known allergies.   Review of Systems Review of Systems  Constitutional: Negative for activity change, appetite change, chills, fatigue and fever.  HENT: Positive for sore throat. Negative for congestion, ear pain, rhinorrhea, sinus pressure and trouble swallowing.   Eyes: Negative for discharge and redness.  Respiratory: Positive for chest tightness. Negative for cough and shortness of breath.   Cardiovascular: Positive for chest pain.  Gastrointestinal: Positive for abdominal pain. Negative for diarrhea, nausea and vomiting.  Genitourinary: Positive for vaginal discharge. Negative for dysuria, flank pain, frequency, genital sores, hematuria, menstrual problem, vaginal bleeding and vaginal pain.   Musculoskeletal: Negative for back pain and myalgias.  Skin: Negative for rash.  Neurological: Positive for headaches. Negative for dizziness and light-headedness.     Physical Exam Triage Vital Signs ED Triage Vitals  Enc Vitals Group     BP 11/01/17 1101 113/68     Pulse Rate 11/01/17 1101 82     Resp 11/01/17 1101 16     Temp 11/01/17 1101 98.3 F (36.8 C)     Temp Source 11/01/17 1101 Oral     SpO2 11/01/17 1101 100 %     Weight 11/01/17 1102 95 lb (43.1 kg)     Height --      Head Circumference --      Peak Flow --      Pain Score 11/01/17 1102 6     Pain Loc --      Pain Edu? --      Excl. in GC? --    No data found.  Updated Vital Signs BP 113/68   Pulse 82   Temp 98.3 F (36.8 C) (Oral)   Resp 16   Wt 95 lb (43.1 kg)   LMP 10/11/2017   SpO2 100%   BMI 18.55 kg/m   Visual Acuity Right Eye Distance:   Left Eye Distance:   Bilateral Distance:    Right Eye Near:   Left Eye Near:    Bilateral Near:     Physical Exam  Constitutional: She is oriented to person, place, and time. She appears well-developed and well-nourished. No distress.  HENT:  Head: Normocephalic and atraumatic.  Mouth/Throat: Oropharynx is clear and moist.  Bilateral ears without tenderness to palpation of external auricle, tragus and mastoid, EAC's without erythema or swelling, TM's with good bony landmarks and cone of light. Non erythematous.  Oral mucosa pink and moist, no tonsillar enlargement or exudate. Posterior pharynx patent and nonerythematous, no uvula deviation or swelling. Normal phonation.  Eyes: Pupils are equal, round, and reactive to light. Conjunctivae and EOM are normal.  Neck: Neck supple.  Cardiovascular: Normal rate and regular rhythm.  No murmur heard. Pulmonary/Chest: Effort normal and breath sounds normal. No respiratory distress.  Breathing comfortably at rest, CTABL, no wheezing, rales or other adventitious sounds auscultated  Chest pain not reproducible  to touch  Abdominal: Soft. There is no tenderness.  Nontender to light and deep palpation throughout all 4 quadrants  Genitourinary:  Genitourinary Comments: Normal female external genitalia, no rashes or lesions observed.  Significant amount of whitish-green milky discharge in vaginal vault, cervix slightly erythematous.  Musculoskeletal: She exhibits no edema.  Neurological: She is alert and oriented to person, place, and time. No cranial nerve deficit. Coordination normal.  Skin: Skin is warm and dry.  Psychiatric: She has a normal mood and  affect.  Nursing note and vitals reviewed.    UC Treatments / Results  Labs (all labs ordered are listed, but only abnormal results are displayed) Labs Reviewed  POCT URINALYSIS DIP (DEVICE) - Abnormal; Notable for the following components:      Result Value   pH 8.5 (*)    Leukocytes, UA SMALL (*)    All other components within normal limits  POCT PREGNANCY, URINE  CERVICOVAGINAL ANCILLARY ONLY    EKG None  Radiology No results found.  Procedures Procedures (including critical care time)  Medications Ordered in UC Medications  ketorolac (TORADOL) injection 60 mg (60 mg Intramuscular Given 11/01/17 1140)    Initial Impression / Assessment and Plan / UC Course  I have reviewed the triage vital signs and the nursing notes.  Pertinent labs & imaging results that were available during my care of the patient were reviewed by me and considered in my medical decision making (see chart for details).     Patient likely with costochondritis as cause of chest discomfort, will provide injection of Toradol in clinic today to treat both chest discomfort and headache.  Will send home with Naprosyn to use for further management of these.  No focal neuro deficit.  Patient also has had discharge likely related to the abdominal pain.  He was vaginal swab obtained to check for GC/chlamydia/trichomonas as well as yeast and BV.  We will go and  initiate treatment for yeast and BV.  Will call and alter treatment as needed.  Patient declined empiric treatment for STDs.  Zofran as needed for nausea.  Discussed strict return precautions. Patient verbalized understanding and is agreeable with plan.  Final Clinical Impressions(s) / UC Diagnoses   Final diagnoses:  Costochondritis  Vaginal discharge  Acute nonintractable headache, unspecified headache type     Discharge Instructions     We gave you a shot of Toradol today to help with your headache and chest discomfort. Please continue to use Naprosyn twice daily for the next couple of weeks to help with chest discomfort/costochondritis. Please return or go to emergency room if pain worsening, developing shortness of breath, nausea, vomiting, different chest pain, numbness in the arm.  We will send the vaginal swab off to check for yeast, BV, gonorrhea, chlamydia and trichomonas.  We will go ahead and treat you for yeast and BV today.  Please start metronidazole twice daily for the next week.  Please take Diflucan once today, may repeat at end of metronidazole.  We will call you with the results of swab and alter treatment as needed Please use Zofran as needed for nausea, this dissolves underneath your tongue  Please return if symptoms worsening, changing or not improving.    ED Prescriptions    Medication Sig Dispense Auth. Provider   naproxen (NAPROSYN) 500 MG tablet Take 1 tablet (500 mg total) by mouth 2 (two) times daily. 30 tablet Amadeo Coke C, PA-C   metroNIDAZOLE (FLAGYL) 500 MG tablet Take 1 tablet (500 mg total) by mouth 2 (two) times daily. 14 tablet Bodin Gorka C, PA-C   fluconazole (DIFLUCAN) 150 MG tablet Take 1 tablet (150 mg total) by mouth once for 1 dose. 2 tablet Kateland Leisinger C, PA-C   ondansetron (ZOFRAN ODT) 4 MG disintegrating tablet Take 1 tablet (4 mg total) by mouth every 8 (eight) hours as needed for nausea or vomiting. 12 tablet Lenor Provencher, [place]  C, PA-C     Controlled Substance Prescriptions [place] Controlled Substance Registry consulted? Not  Applicable   Lew Dawes, PA-C 11/01/17 1201

## 2017-11-01 NOTE — Discharge Instructions (Addendum)
We gave you a shot of Toradol today to help with your headache and chest discomfort. Please continue to use Naprosyn twice daily for the next couple of weeks to help with chest discomfort/costochondritis. Please return or go to emergency room if pain worsening, developing shortness of breath, nausea, vomiting, different chest pain, numbness in the arm.  We will send the vaginal swab off to check for yeast, BV, gonorrhea, chlamydia and trichomonas.  We will go ahead and treat you for yeast and BV today.  Please start metronidazole twice daily for the next week.  Please take Diflucan once today, may repeat at end of metronidazole.  We will call you with the results of swab and alter treatment as needed Please use Zofran as needed for nausea, this dissolves underneath your tongue  Please return if symptoms worsening, changing or not improving.

## 2017-11-02 LAB — CERVICOVAGINAL ANCILLARY ONLY
Bacterial vaginitis: POSITIVE — AB
CANDIDA VAGINITIS: NEGATIVE
CHLAMYDIA, DNA PROBE: POSITIVE — AB
Neisseria Gonorrhea: NEGATIVE
TRICH (WINDOWPATH): NEGATIVE

## 2017-11-04 ENCOUNTER — Telehealth (HOSPITAL_COMMUNITY): Payer: Self-pay | Admitting: Emergency Medicine

## 2017-11-04 ENCOUNTER — Telehealth (HOSPITAL_COMMUNITY): Payer: Self-pay

## 2017-11-04 MED ORDER — AZITHROMYCIN 500 MG PO TABS: 1000.0000 mg | ORAL_TABLET | Freq: Once | ORAL | 0 refills | Status: AC

## 2017-11-04 NOTE — Telephone Encounter (Signed)
Tested positive for chlamydia and BV. BV was treated at visit. Azithromycin sent in to pharmacy. Will call and inform.

## 2017-11-04 NOTE — Telephone Encounter (Signed)
Bacterial Vaginosis test is positive.  Prescription for metronidazole was given at the urgent care visit. Pt contacted regarding results. Answered all questions. Verbalized understanding.  Chlamydia is positive.  Rx po zithromax 1g #1 dose no refills was sent to the pharmacy of record.  Pt contacted and made aware, educated to please refrain from sexual intercourse for 7 days to give the medicine time to work, sexual partners need to be notified and tested/treated.  Condoms may reduce risk of reinfection.  Recheck or followup with PCP for further evaluation if symptoms are not improving.   [facility] notified

## 2018-01-04 ENCOUNTER — Inpatient Hospital Stay (HOSPITAL_COMMUNITY)
Admission: AD | Admit: 2018-01-04 | Discharge: 2018-01-04 | Disposition: A | Discharge status: Home / Self Care | Payer: BLUE CROSS/BLUE SHIELD | Source: Ambulatory Visit | Attending: Obstetrics and Gynecology | Admitting: Obstetrics and Gynecology

## 2018-01-04 DIAGNOSIS — Z3200 Encounter for pregnancy test, result unknown: Secondary | ICD-10-CM | POA: Diagnosis present

## 2018-01-04 DIAGNOSIS — Z7689 Persons encountering health services in other specified circumstances: Secondary | ICD-10-CM | POA: Diagnosis not present

## 2018-01-04 NOTE — Discharge Instructions (Signed)
First Trimester of Pregnancy °The first trimester of pregnancy is from week 1 until the end of week 13 (months 1 through 3). A week after a sperm fertilizes an egg, the egg will implant on the wall of the uterus. This embryo will begin to develop into a baby. Genes from you and your partner will form the baby. The female genes will determine whether the baby will be a boy or a girl. At 6-8 weeks, the eyes and face will be formed, and the heartbeat can be seen on ultrasound. At the end of 12 weeks, all the baby's organs will be formed. °Now that you are pregnant, you will want to do everything you can to have a healthy baby. Two of the most important things are to get good prenatal care and to follow your health care provider's instructions. Prenatal care is all the medical care you receive before the baby's birth. This care will help prevent, find, and treat any problems during the pregnancy and childbirth. °Body changes during your first trimester °Your body goes through many changes during pregnancy. The changes vary from woman to woman. °· You may gain or lose a couple of pounds at first. °· You may feel sick to your stomach (nauseous) and you may throw up (vomit). If the vomiting is uncontrollable, call your health care provider. °· You may tire easily. °· You may develop headaches that can be relieved by medicines. All medicines should be approved by your health care provider. °· You may urinate more often. Painful urination may mean you have a bladder infection. °· You may develop heartburn as a result of your pregnancy. °· You may develop constipation because certain hormones are causing the muscles that push stool through your intestines to slow down. °· You may develop hemorrhoids or swollen veins (varicose veins). °· Your breasts may begin to grow larger and become tender. Your nipples may stick out more, and the tissue that surrounds them (areola) may become darker. °· Your gums may bleed and may be  sensitive to brushing and flossing. °· Dark spots or blotches (chloasma, mask of pregnancy) may develop on your face. This will likely fade after the baby is born. °· Your menstrual periods will stop. °· You may have a loss of appetite. °· You may develop cravings for certain kinds of food. °· You may have changes in your emotions from day to day, such as being excited to be pregnant or being concerned that something may go wrong with the pregnancy and baby. °· You may have more vivid and strange dreams. °· You may have changes in your hair. These can include thickening of your hair, rapid growth, and changes in texture. Some women also have hair loss during or after pregnancy, or hair that feels dry or thin. Your hair will most likely return to normal after your baby is born. ° °What to expect at prenatal visits °During a routine prenatal visit: °· You will be weighed to make sure you and the baby are growing normally. °· Your blood pressure will be taken. °· Your abdomen will be measured to track your baby's growth. °· The fetal heartbeat will be listened to between weeks 10 and 14 of your pregnancy. °· Test results from any previous visits will be discussed. ° °Your health care provider may ask you: °· How you are feeling. °· If you are feeling the baby move. °· If you have had any abnormal symptoms, such as leaking fluid, bleeding, severe headaches,   or abdominal cramping. °· If you are using any tobacco products, including cigarettes, chewing tobacco, and electronic cigarettes. °· If you have any questions. ° °Other tests that may be performed during your first trimester include: °· Blood tests to find your blood type and to check for the presence of any previous infections. The tests will also be used to check for low iron levels (anemia) and protein on red blood cells (Rh antibodies). Depending on your risk factors, or if you previously had diabetes during pregnancy, you may have tests to check for high blood  sugar that affects pregnant women (gestational diabetes). °· Urine tests to check for infections, diabetes, or protein in the urine. °· An ultrasound to confirm the proper growth and development of the baby. °· Fetal screens for spinal cord problems (spina bifida) and Down syndrome. °· HIV (human immunodeficiency virus) testing. Routine prenatal testing includes screening for HIV, unless you choose not to have this test. °· You may need other tests to make sure you and the baby are doing well. ° °Follow these instructions at home: °Medicines °· Follow your health care provider's instructions regarding medicine use. Specific medicines may be either safe or unsafe to take during pregnancy. °· Take a prenatal vitamin that contains at least 600 micrograms (mcg) of folic acid. °· If you develop constipation, try taking a stool softener if your health care provider approves. °Eating and drinking °· Eat a balanced diet that includes fresh fruits and vegetables, whole grains, good sources of protein such as meat, eggs, or tofu, and low-fat dairy. Your health care provider will help you determine the amount of weight gain that is right for you. °· Avoid raw meat and uncooked cheese. These carry germs that can cause birth defects in the baby. °· Eating four or five small meals rather than three large meals a day may help relieve nausea and vomiting. If you start to feel nauseous, eating a few soda crackers can be helpful. Drinking liquids between meals, instead of during meals, also seems to help ease nausea and vomiting. °· Limit foods that are high in fat and processed sugars, such as fried and sweet foods. °· To prevent constipation: °? Eat foods that are high in fiber, such as fresh fruits and vegetables, whole grains, and beans. °? Drink enough fluid to keep your urine clear or pale yellow. °Activity °· Exercise only as directed by your health care provider. Most women can continue their usual exercise routine during  pregnancy. Try to exercise for 30 minutes at least 5 days a week. Exercising will help you: °? Control your weight. °? Stay in shape. °? Be prepared for labor and delivery. °· Experiencing pain or cramping in the lower abdomen or lower back is a good sign that you should stop exercising. Check with your health care provider before continuing with normal exercises. °· Try to avoid standing for long periods of time. Move your legs often if you must stand in one place for a long time. °· Avoid heavy lifting. °· Wear low-heeled shoes and practice good posture. °· You may continue to have sex unless your health care provider tells you not to. °Relieving pain and discomfort °· Wear a good support bra to relieve breast tenderness. °· Take warm sitz baths to soothe any pain or discomfort caused by hemorrhoids. Use hemorrhoid cream if your health care provider approves. °· Rest with your legs elevated if you have leg cramps or low back pain. °· If you develop   varicose veins in your legs, wear support hose. Elevate your feet for 15 minutes, 3-4 times a day. Limit salt in your diet. °Prenatal care °· Schedule your prenatal visits by the twelfth week of pregnancy. They are usually scheduled monthly at first, then more often in the last 2 months before delivery. °· Write down your questions. Take them to your prenatal visits. °· Keep all your prenatal visits as told by your health care provider. This is important. °Safety °· Wear your seat belt at all times when driving. °· Make a list of emergency phone numbers, including numbers for family, friends, the hospital, and police and fire departments. °General instructions °· Ask your health care provider for a referral to a local prenatal education class. Begin classes no later than the beginning of month 6 of your pregnancy. °· Ask for help if you have counseling or nutritional needs during pregnancy. Your health care provider can offer advice or refer you to specialists for help  with various needs. °· Do not use hot tubs, steam rooms, or saunas. °· Do not douche or use tampons or scented sanitary pads. °· Do not cross your legs for long periods of time. °· Avoid cat litter boxes and soil used by cats. These carry germs that can cause birth defects in the baby and possibly loss of the fetus by miscarriage or stillbirth. °· Avoid all smoking, herbs, alcohol, and medicines not prescribed by your health care provider. Chemicals in these products affect the formation and growth of the baby. °· Do not use any products that contain nicotine or tobacco, such as cigarettes and e-cigarettes. If you need help quitting, ask your health care provider. You may receive counseling support and other resources to help you quit. °· Schedule a dentist appointment. At home, brush your teeth with a soft toothbrush and be gentle when you floss. °Contact a health care provider if: °· You have dizziness. °· You have mild pelvic cramps, pelvic pressure, or nagging pain in the abdominal area. °· You have persistent nausea, vomiting, or diarrhea. °· You have a bad smelling vaginal discharge. °· You have pain when you urinate. °· You notice increased swelling in your face, hands, legs, or ankles. °· You are exposed to fifth disease or chickenpox. °· You are exposed to German measles (rubella) and have never had it. °Get help right away if: °· You have a fever. °· You are leaking fluid from your vagina. °· You have spotting or bleeding from your vagina. °· You have severe abdominal cramping or pain. °· You have rapid weight gain or loss. °· You vomit blood or material that looks like coffee grounds. °· You develop a severe headache. °· You have shortness of breath. °· You have any kind of trauma, such as from a fall or a car accident. °Summary °· The first trimester of pregnancy is from week 1 until the end of week 13 (months 1 through 3). °· Your body goes through many changes during pregnancy. The changes vary from  woman to woman. °· You will have routine prenatal visits. During those visits, your health care provider will examine you, discuss any test results you may have, and talk with you about how you are feeling. °This information is not intended to replace advice given to you by your health care provider. Make sure you discuss any questions you have with your health care provider. °Document Released: 04/20/2001 Document Revised: 04/07/2016 Document Reviewed: 04/07/2016 °Elsevier Interactive Patient Education © 2018 [employer]  Safe Medications in Pregnancy   Acne: Benzoyl Peroxide Salicylic Acid  Backache/Headache: Tylenol: 2 regular strength every 4 hours OR              2 Extra strength every 6 hours  Colds/Coughs/Allergies: Benadryl (alcohol free) 25 mg every 6 hours as needed Breath right strips Claritin Cepacol throat lozenges Chloraseptic throat spray Cold-Eeze- up to three times per day Cough drops, alcohol free Flonase (by prescription only) Guaifenesin Mucinex Robitussin DM (plain only, alcohol free) Saline nasal spray/drops Sudafed (pseudoephedrine) & Actifed ** use only after [redacted] weeks gestation and if you do not have high blood pressure Tylenol Vicks Vaporub Zinc lozenges Zyrtec   Constipation: Colace Ducolax suppositories Fleet enema Glycerin suppositories Metamucil Milk of magnesia Miralax Senokot Smooth move tea  Diarrhea: Kaopectate Imodium A-D  *NO pepto Bismol  Hemorrhoids: Anusol Anusol HC Preparation H Tucks  Indigestion: Tums Maalox Mylanta Zantac  Pepcid  Insomnia: Benadryl (alcohol free) 25mg  every 6 hours as needed Tylenol PM Unisom, no Gelcaps  Leg Cramps: Tums MagGel  Nausea/Vomiting:  Bonine Dramamine Emetrol Ginger extract Sea bands Meclizine  Nausea medication to take during pregnancy:  Unisom (doxylamine succinate 25 mg tablets) Take one tablet daily at bedtime. If symptoms are not adequately controlled, the dose  can be increased to a maximum recommended dose of two tablets daily (1/2 tablet in the morning, 1/2 tablet mid-afternoon and one at bedtime). Vitamin B6 100mg  tablets. Take one tablet twice a day (up to 200 mg per day).  Skin Rashes: Aveeno products Benadryl cream or 25mg  every 6 hours as needed Calamine Lotion 1% cortisone cream  Yeast infection: Gyne-lotrimin 7 Monistat 7   **If taking multiple medications, please check labels to avoid duplicating the same active ingredients **take medication as directed on the label ** Do not exceed 4000 mg of tylenol in 24 hours **Do not take medications that contain aspirin or ibuprofen

## 2018-01-04 NOTE — MAU Note (Signed)
Wants pregnancy confirmation.  No pain or bleeding.

## 2018-01-04 NOTE — MAU Provider Note (Signed)
[name] is a 22 y.o. G1P1001 at approximately [redacted] weeks gestation who presents to MAU today for pregnancy verification. The patient denies abdominal pain or vaginal bleeding today.   BP 127/78   Pulse 99   Temp 98.4 F (36.9 C)   Resp 16   Ht 5' (1.524 m)   Wt 41.2 kg   BMI 17.72 kg/m   CONSTITUTIONAL: Well-developed, well-nourished female in no acute distress.  ENT: External right and left ear normal.  EYES: EOM intact, conjunctivae normal.  MUSCULOSKELETAL: Normal range of motion.  CARDIOVASCULAR: Regular heart rate RESPIRATORY: Normal effort NEUROLOGICAL: Alert and oriented to person, place, and time.  SKIN: Skin is warm and dry. No rash noted. Not diaphoretic. No erythema. No pallor. PSYCH: Normal mood and affect. Normal behavior. Normal judgment and thought content.  A: Possible Pregnancy  P: Discussed with patient that pregnancy confirmation is not emergent and no longer done in MAU. [facility].  Discharge home Patient advised to start taking prenatal vitamins and encouraged patient to establish prenatal care as soon as possible First trimester warning signs reviewed Patient may return to MAU as needed or if her condition were to change or worsen   Rolm Bookbindereill, Caroline M, [place]  01/04/2018 8:26 PM

## 2018-01-05 ENCOUNTER — Ambulatory Visit (INDEPENDENT_AMBULATORY_CARE_PROVIDER_SITE_OTHER): Payer: BLUE CROSS/BLUE SHIELD | Admitting: General Practice

## 2018-01-05 ENCOUNTER — Encounter: Payer: Self-pay | Admitting: Family Medicine

## 2018-01-05 DIAGNOSIS — Z3201 Encounter for pregnancy test, result positive: Secondary | ICD-10-CM | POA: Diagnosis not present

## 2018-01-05 LAB — POCT PREGNANCY, URINE: Preg Test, Ur: POSITIVE — AB

## 2018-01-05 NOTE — Progress Notes (Signed)
Chart reviewed for nurse visit. Agree with plan of care.   Judeth HornLawrence, Avante Carneiro, NP 01/05/2018 9:24 AM

## 2018-01-05 NOTE — Progress Notes (Signed)
Patient presents to office today for UPT. UPT +. Patient reports first positive home test 12/24/17. LMP 11/27/17 EDD 09/03/18 [id number]. Patient denies taking any meds or vitamins. Recommended she start taking PNV & OB care. [facility] [place] [facility] [place] [facility] Patient had no questions.

## 2018-02-06 ENCOUNTER — Inpatient Hospital Stay (HOSPITAL_COMMUNITY): Payer: BLUE CROSS/BLUE SHIELD

## 2018-02-06 ENCOUNTER — Encounter (HOSPITAL_COMMUNITY): Payer: Self-pay

## 2018-02-06 ENCOUNTER — Inpatient Hospital Stay (HOSPITAL_COMMUNITY)
Admission: AD | Admit: 2018-02-06 | Discharge: 2018-02-06 | Disposition: A | Discharge status: Home / Self Care | Payer: BLUE CROSS/BLUE SHIELD | Source: Ambulatory Visit | Attending: Obstetrics and Gynecology | Admitting: Obstetrics and Gynecology

## 2018-02-06 DIAGNOSIS — Z3A10 10 weeks gestation of pregnancy: Secondary | ICD-10-CM | POA: Insufficient documentation

## 2018-02-06 DIAGNOSIS — O209 Hemorrhage in early pregnancy, unspecified: Secondary | ICD-10-CM

## 2018-02-06 DIAGNOSIS — O26851 Spotting complicating pregnancy, first trimester: Secondary | ICD-10-CM | POA: Diagnosis not present

## 2018-02-06 DIAGNOSIS — R109 Unspecified abdominal pain: Secondary | ICD-10-CM | POA: Insufficient documentation

## 2018-02-06 DIAGNOSIS — O26891 Other specified pregnancy related conditions, first trimester: Secondary | ICD-10-CM

## 2018-02-06 DIAGNOSIS — O418X10 Other specified disorders of amniotic fluid and membranes, first trimester, not applicable or unspecified: Secondary | ICD-10-CM

## 2018-02-06 DIAGNOSIS — Z3490 Encounter for supervision of normal pregnancy, unspecified, unspecified trimester: Secondary | ICD-10-CM

## 2018-02-06 DIAGNOSIS — O418X90 Other specified disorders of amniotic fluid and membranes, unspecified trimester, not applicable or unspecified: Secondary | ICD-10-CM | POA: Diagnosis present

## 2018-02-06 DIAGNOSIS — O468X1 Other antepartum hemorrhage, first trimester: Secondary | ICD-10-CM

## 2018-02-06 DIAGNOSIS — O468X9 Other antepartum hemorrhage, unspecified trimester: Secondary | ICD-10-CM

## 2018-02-06 LAB — HCG, QUANTITATIVE, PREGNANCY: hCG, Beta Chain, Quant, S: 188358 m[IU]/mL — ABNORMAL HIGH (ref <5)

## 2018-02-06 LAB — URINALYSIS, ROUTINE W REFLEX MICROSCOPIC
Bilirubin Urine: NEGATIVE
Glucose, UA: NEGATIVE mg/dL
Hgb urine dipstick: NEGATIVE
KETONES UR: NEGATIVE mg/dL
LEUKOCYTES UA: NEGATIVE
Nitrite: NEGATIVE
PROTEIN: NEGATIVE mg/dL
Specific Gravity, Urine: 1.017 (ref 1.005–1.030)
pH: 7 (ref 5.0–8.0)

## 2018-02-06 LAB — CBC
HCT: 34.6 % — ABNORMAL LOW (ref 36.0–46.0)
Hemoglobin: 10.8 g/dL — ABNORMAL LOW (ref 12.0–15.0)
MCH: 24.2 pg — AB (ref 26.0–34.0)
MCHC: 31.2 g/dL (ref 30.0–36.0)
MCV: 77.6 fL — AB (ref 78.0–100.0)
PLATELETS: 260 10*3/uL (ref 150–400)
RBC: 4.46 MIL/uL (ref 3.87–5.11)
RDW: 18.6 % — ABNORMAL HIGH (ref 11.5–15.5)
WBC: 7 10*3/uL (ref 4.0–10.5)

## 2018-02-06 LAB — WET PREP, GENITAL
CLUE CELLS WET PREP: NONE SEEN
Sperm: NONE SEEN
Trich, Wet Prep: NONE SEEN
Yeast Wet Prep HPF POC: NONE SEEN

## 2018-02-06 MED ORDER — ACETAMINOPHEN 500 MG PO TABS
1000.0000 mg | ORAL_TABLET | Freq: Once | ORAL | Status: AC
  Administered 2018-02-06: 1000 mg via ORAL
  Filled 2018-02-06: qty 2

## 2018-02-06 NOTE — Discharge Instructions (Signed)

## 2018-02-06 NOTE — MAU Note (Signed)
Pt here with c/o abdominal and back pain, some light spotting for the last couple of days. Had a positive pregnancy test here; due for first appointment on Monday.

## 2018-02-06 NOTE — MAU Provider Note (Addendum)
History     CSN: [id number]  Arrival date and time: 02/06/18 4098   First Provider Initiated Contact with Patient 02/06/18 2033      Chief Complaint  Patient presents with  . Abdominal Pain  . Back Pain   HPI  Danielle Bradley is a 22 y.o. G2P1001 at [redacted]w[redacted]d who presents to MAU with chief complaints of abdominal cramping as well as vaginal spotting for the past three days. Pain is 3/10, bilateral low abdomen, radiates to low back, no aggravating or alleviating factors. Most recent sexual intercourse three days ago.   Denies heavy vaginal bleeding,abnormal vaginal discharge, fever, falls, or recent illness.    No previous imaging this pregnancy.  OB History    Gravida  2   Para  1   Term  1   Preterm      AB      Living  1     SAB      TAB      Ectopic      Multiple  0   Live Births  1           Past Medical History:  Diagnosis Date  . 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 WISDOM TOOTH EXTRACTION      Family History  Problem Relation Age of Onset  . Neurofibromatosis Mother   . Neurofibromatosis Sister     Social History   Tobacco Use  . Smoking status: Never Smoker  . Smokeless tobacco: Never Used  Substance Use Topics  . Alcohol use: No    Alcohol/week: 0.0 standard drinks  . Drug use: No    Allergies: No Known Allergies  Medications Prior to Admission  Medication Sig Dispense Refill Last Dose  . ESTARYLLA 0.25-35 MG-MCG tablet TAKE 1 TABLET BY MOUTH ONCE DAILY 3 Package 5   . metroNIDAZOLE (FLAGYL) 500 MG tablet Take 1 tablet (500 mg total) by mouth 2 (two) times daily. 14 tablet 0   . naproxen (NAPROSYN) 500 MG tablet Take 1 tablet (500 mg total) by mouth 2 (two) times daily. 30 tablet 0   . ondansetron (ZOFRAN ODT) 4 MG disintegrating tablet Take 1 tablet (4 mg total) by mouth every 8 (eight) hours as needed for nausea or vomiting. 12 tablet 0   . ondansetron (ZOFRAN) 4 MG tablet  Take 1 tablet (4 mg total) by mouth every 6 (six) hours. 15 tablet 0 More than a month at Unknown time  . Prenatal Vit-Fe Fumarate-FA (MULTIVITAMIN-PRENATAL) 27-0.8 MG TABS tablet Take 1 tablet by mouth daily at 12 noon.   More than a month at Unknown time    Review of Systems  Constitutional: Negative for chills, fatigue and fever.  Respiratory: Negative for shortness of breath.   Gastrointestinal: Positive for abdominal pain.  Genitourinary: Positive for vaginal bleeding.  All other systems reviewed and are negative.  Physical Exam   Blood pressure 122/67, pulse 81, temperature 98.3 F (36.8 C), temperature source Oral, resp. rate 18, height 5' (1.524 m), weight 41.7 kg, last menstrual period 11/27/2017, SpO2 100 %.  Physical Exam  Nursing note and vitals reviewed. Constitutional: She is oriented to person, place, and time. She appears well-developed and well-nourished.  Cardiovascular: Normal rate and intact distal pulses.  Respiratory: Effort normal. No respiratory distress.  GI: Soft. There is tenderness in the suprapubic area. There is no rebound and no CVA tenderness.  Suprapubic tenderness to light palpation  Genitourinary: Vagina normal and uterus normal.  Neurological: She is alert and oriented to person, place, and time.  Skin: Skin is warm and dry.  Psychiatric: She has a normal mood and affect. Her behavior is normal. Judgment and thought content normal.    MAU Course  Procedures  MDM   Patient Vitals for the past 24 hrs:  BP Temp Temp src Pulse Resp SpO2 Height Weight  02/06/18 2013 122/67 98.3 F (36.8 C) Oral 81 18 100 % 5' (1.524 m) 41.7 kg     Results for orders placed or performed during the hospital encounter of 02/06/18 (from the past 24 hour(s))  Urinalysis, Routine w reflex microscopic     Status: Abnormal   Collection Time: 02/06/18  8:38 PM  Result Value Ref Range   Color, Urine YELLOW YELLOW   APPearance HAZY (A) CLEAR   Specific Gravity, Urine  1.017 1.005 - 1.030   pH 7.0 5.0 - 8.0   Glucose, UA NEGATIVE NEGATIVE mg/dL   Hgb urine dipstick NEGATIVE NEGATIVE   Bilirubin Urine NEGATIVE NEGATIVE   Ketones, ur NEGATIVE NEGATIVE mg/dL   Protein, ur NEGATIVE NEGATIVE mg/dL   Nitrite NEGATIVE NEGATIVE   Leukocytes, UA NEGATIVE NEGATIVE  Wet prep, genital     Status: Abnormal   Collection Time: 02/06/18  8:44 PM  Result Value Ref Range   Yeast Wet Prep HPF POC NONE SEEN NONE SEEN   Trich, Wet Prep NONE SEEN NONE SEEN   Clue Cells Wet Prep HPF POC NONE SEEN NONE SEEN   WBC, Wet Prep HPF POC FEW (A) NONE SEEN   Sperm NONE SEEN   CBC     Status: Abnormal   Collection Time: 02/06/18  9:02 PM  Result Value Ref Range   WBC 7.0 4.0 - 10.5 K/uL   RBC 4.46 3.87 - 5.11 MIL/uL   Hemoglobin 10.8 (L) 12.0 - 15.0 g/dL   HCT 84.6 (L) 96.2 - 95.2 %   MCV 77.6 (L) 78.0 - 100.0 fL   MCH 24.2 (L) 26.0 - 34.0 pg   MCHC 31.2 30.0 - 36.0 g/dL   RDW 84.1 (H) 32.4 - 40.1 %   Platelets 260 150 - 400 K/uL  hCG, quantitative, pregnancy     Status: Abnormal   Collection Time: 02/06/18  9:02 PM  Result Value Ref Range   hCG, Beta Chain, Quant, S 188,358 (H) <5 mIU/mL    Report given to R. Arita Miss, CNM who assumes care at this time  Clayton Bibles, [place] 02/06/18  10:07 PM    US Ob Comp Less 14 Wks  Result Date: 02/06/2018 CLINICAL DATA:  Initial evaluation for acute vaginal spotting, abdominal and back pain. Early pregnancy. EXAM: OBSTETRIC <14 WK [place] AND TRANSVAGINAL OB US TECHNIQUE: Both transabdominal and transvaginal ultrasound examinations were performed for complete evaluation of the gestation as well as the maternal uterus, adnexal regions, and pelvic cul-de-sac. Transvaginal technique was performed to assess early pregnancy. COMPARISON:  None. FINDINGS: Intrauterine gestational sac: Single Yolk sac:  Present Embryo:  Present Cardiac Activity: Present Heart Rate: 164 bpm CRL: 30.6 mm   9 w   6 d                  [place] EDC: 09/05/2018 Subchorionic  hemorrhage: Small subchorionic hemorrhage without mass effect. Maternal uterus/adnexae: Ovaries are normal in appearance bilaterally. No adnexal mass or free fluid. IMPRESSION: 1. Single live intrauterine pregnancy as above, estimated gestational age [redacted] weeks and 6 days by  crown-rump length, with ultrasound EDC of 09/05/2018. 2. Small subchorionic hemorrhage without mass effect. 3. No other acute maternal uterine or adnexal abnormality identified. Electronically Signed   By: Rise Mu M.D.   On: 02/06/2018 22:29    Assessment and Plan  Subchorionic hematoma in first trimester, single or unspecified fetus  - Information provided on [facility] and VB in pregnancy - Advised that brownish bleeding/spotting will occur constantly or intermittently - Advised to return if VB is like a period   Abdominal pain in pregnancy, first trimester  - Can take Tylenol 1000 mg every 6 hrs prn pain - Safe medication in pregnancy list given - Information provided on abd pain in pregnancy   - Discharge patient - Keep scheduled appt with [facility] on Monday 02/13/18 - Patient verbalized an understanding of the plan of care and agrees.     Raelyn Mora, CNM  02/06/2018 11:17 PM

## 2018-02-07 LAB — GC/CHLAMYDIA PROBE AMP (~~LOC~~) NOT AT ARMC
CHLAMYDIA, DNA PROBE: NEGATIVE
NEISSERIA GONORRHEA: NEGATIVE

## 2018-02-13 ENCOUNTER — Other Ambulatory Visit (HOSPITAL_COMMUNITY)
Admission: RE | Admit: 2018-02-13 | Discharge: 2018-02-13 | Disposition: A | Discharge status: Home / Self Care | Payer: BLUE CROSS/BLUE SHIELD | Source: Ambulatory Visit | Attending: Obstetrics and Gynecology | Admitting: Obstetrics and Gynecology

## 2018-02-13 ENCOUNTER — Ambulatory Visit (INDEPENDENT_AMBULATORY_CARE_PROVIDER_SITE_OTHER): Payer: BLUE CROSS/BLUE SHIELD | Admitting: Obstetrics and Gynecology

## 2018-02-13 ENCOUNTER — Encounter: Payer: Self-pay | Admitting: Obstetrics and Gynecology

## 2018-02-13 DIAGNOSIS — Z3480 Encounter for supervision of other normal pregnancy, unspecified trimester: Secondary | ICD-10-CM | POA: Insufficient documentation

## 2018-02-13 DIAGNOSIS — O209 Hemorrhage in early pregnancy, unspecified: Secondary | ICD-10-CM

## 2018-02-13 DIAGNOSIS — Z3481 Encounter for supervision of other normal pregnancy, first trimester: Secondary | ICD-10-CM | POA: Insufficient documentation

## 2018-02-13 DIAGNOSIS — O418X10 Other specified disorders of amniotic fluid and membranes, first trimester, not applicable or unspecified: Secondary | ICD-10-CM

## 2018-02-13 DIAGNOSIS — Z3490 Encounter for supervision of normal pregnancy, unspecified, unspecified trimester: Secondary | ICD-10-CM

## 2018-02-13 DIAGNOSIS — O468X1 Other antepartum hemorrhage, first trimester: Secondary | ICD-10-CM

## 2018-02-13 MED ORDER — DOXYLAMINE-PYRIDOXINE 10-10 MG PO TBEC: 2.0000 | DELAYED_RELEASE_TABLET | Freq: Every day | ORAL | 5 refills | Status: DC

## 2018-02-13 NOTE — Progress Notes (Signed)
  Subjective:    Danielle Bradley is a G2P1001 [redacted]w[redacted]d being seen today for her first obstetrical visit.  Her obstetrical history is significant for second normal pregnancy and diagnosis of subchorionic hemorrhage. Patient does intend to breast feed. Pregnancy history fully reviewed.  Patient reports nausea.  Vitals:   02/13/18 0958  BP: 110/73  Pulse: 94  Weight: 93 lb (42.2 kg)    HISTORY: OB History  Gravida Para Term Preterm AB Living  2 1 1     1   SAB TAB Ectopic Multiple Live Births        0 1    # Outcome Date GA Lbr Len/2nd Weight Sex Delivery Anes PTL Lv  2 Current           1 Term 01/29/16 [redacted]w[redacted]d 10:50 / 04:24 8 lb 6 oz (3.799 kg) M Vag-Vacuum EPI  LIV   Past Medical History:  Diagnosis Date  . Neurofibromatosis, type 1 [facility])    Past Surgical History:  Procedure Laterality Date  . WISDOM TOOTH EXTRACTION     Family History  Problem Relation Age of Onset  . Neurofibromatosis Mother   . Neurofibromatosis Sister   . Neurofibromatosis Maternal Grandmother      Exam    Uterus:   12-weeks  Pelvic Exam:    Perineum: No Hemorrhoids, Normal Perineum   Vulva: normal   Vagina:  normal mucosa, normal discharge   pH:    Cervix: multiparous appearance   Adnexa: normal adnexa and no mass, fullness, tenderness   Bony Pelvis: gynecoid  System: Breast:  normal appearance, no masses or tenderness   Skin: normal coloration and turgor, no rashes    Neurologic: oriented, no focal deficits   Extremities: normal strength, tone, and muscle mass   HEENT extra ocular movement intact   Mouth/Teeth mucous membranes moist, pharynx normal without lesions and dental hygiene good   Neck supple and no masses   Cardiovascular: regular rate and rhythm   Respiratory:  appears well, vitals normal, no respiratory distress, acyanotic, normal RR, chest clear, no wheezing, crepitations, rhonchi, normal symmetric air entry   Abdomen: soft, non-tender; bowel sounds normal; no masses,  no  organomegaly   Urinary:       Assessment:    Pregnancy: G2P1001 Patient Active Problem List   Diagnosis Date Noted  . Supervision of other normal pregnancy, antepartum 02/13/2018  . Subchorionic hematoma 02/06/2018  . Intrauterine pregnancy 02/06/2018  . Bleeding in early pregnancy 02/06/2018  . 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 Prenatal vitamins. Problem list reviewed and updated. Genetic Screening discussed : panorama ordered.  Ultrasound discussed; fetal survey: ordered. Rx diclegis provided  Follow up in 4 weeks. 50% of 30 min visit spent on counseling and coordination of care.     Danielle Bradley 02/13/2018

## 2018-02-13 NOTE — Patient Instructions (Signed)
 First Trimester of Pregnancy The first trimester of pregnancy is from week 1 until the end of week 13 (months 1 through 3). A week after a sperm fertilizes an egg, the egg will implant on the wall of the uterus. This embryo will begin to develop into a baby. Genes from you and your partner will form the baby. The female genes will determine whether the baby will be a boy or a girl. At 6-8 weeks, the eyes and face will be formed, and the heartbeat can be seen on ultrasound. At the end of 12 weeks, all the baby's organs will be formed. Now that you are pregnant, you will want to do everything you can to have a healthy baby. Two of the most important things are to get good prenatal care and to follow your health care provider's instructions. Prenatal care is all the medical care you receive before the baby's birth. This care will help prevent, find, and treat any problems during the pregnancy and childbirth. Body changes during your first trimester Your body goes through many changes during pregnancy. The changes vary from woman to woman.  You may gain or lose a couple of pounds at first.  You may feel sick to your stomach (nauseous) and you may throw up (vomit). If the vomiting is uncontrollable, call your health care provider.  You may tire easily.  You may develop headaches that can be relieved by medicines. All medicines should be approved by your health care provider.  You may urinate more often. Painful urination may mean you have a bladder infection.  You may develop heartburn as a result of your pregnancy.  You may develop constipation because certain hormones are causing the muscles that push stool through your intestines to slow down.  You may develop hemorrhoids or swollen veins (varicose veins).  Your breasts may begin to grow larger and become tender. Your nipples may stick out more, and the tissue that surrounds them (areola) may become darker.  Your gums may bleed and may be  sensitive to brushing and flossing.  Dark spots or blotches (chloasma, mask of pregnancy) may develop on your face. This will likely fade after the baby is born.  Your menstrual periods will stop.  You may have a loss of appetite.  You may develop cravings for certain kinds of food.  You may have changes in your emotions from day to day, such as being excited to be pregnant or being concerned that something may go wrong with the pregnancy and baby.  You may have more vivid and strange dreams.  You may have changes in your hair. These can include thickening of your hair, rapid growth, and changes in texture. Some women also have hair loss during or after pregnancy, or hair that feels dry or thin. Your hair will most likely return to normal after your baby is born.  What to expect at prenatal visits During a routine prenatal visit:  You will be weighed to make sure you and the baby are growing normally.  Your blood pressure will be taken.  Your abdomen will be measured to track your baby's growth.  The fetal heartbeat will be listened to between weeks 10 and 14 of your pregnancy.  Test results from any previous visits will be discussed.  Your health care provider may ask you:  How you are feeling.  If you are feeling the baby move.  If you have had any abnormal symptoms, such as leaking fluid, bleeding, severe   headaches, or abdominal cramping.  If you are using any tobacco products, including cigarettes, chewing tobacco, and electronic cigarettes.  If you have any questions.  Other tests that may be performed during your first trimester include:  Blood tests to find your blood type and to check for the presence of any previous infections. The tests will also be used to check for low iron levels (anemia) and protein on red blood cells (Rh antibodies). Depending on your risk factors, or if you previously had diabetes during pregnancy, you may have tests to check for high blood  sugar that affects pregnant women (gestational diabetes).  Urine tests to check for infections, diabetes, or protein in the urine.  An ultrasound to confirm the proper growth and development of the baby.  Fetal screens for spinal cord problems (spina bifida) and Down syndrome.  HIV (human immunodeficiency virus) testing. Routine prenatal testing includes screening for HIV, unless you choose not to have this test.  You may need other tests to make sure you and the baby are doing well.  Follow these instructions at home: Medicines  Follow your health care provider's instructions regarding medicine use. Specific medicines may be either safe or unsafe to take during pregnancy.  Take a prenatal vitamin that contains at least 600 micrograms (mcg) of folic acid.  If you develop constipation, try taking a stool softener if your health care provider approves. Eating and drinking  Eat a balanced diet that includes fresh fruits and vegetables, whole grains, good sources of protein such as meat, eggs, or tofu, and low-fat dairy. Your health care provider will help you determine the amount of weight gain that is right for you.  Avoid raw meat and uncooked cheese. These carry germs that can cause birth defects in the baby.  Eating four or five small meals rather than three large meals a day may help relieve nausea and vomiting. If you start to feel nauseous, eating a few soda crackers can be helpful. Drinking liquids between meals, instead of during meals, also seems to help ease nausea and vomiting.  Limit foods that are high in fat and processed sugars, such as fried and sweet foods.  To prevent constipation: ? Eat foods that are high in fiber, such as fresh fruits and vegetables, whole grains, and beans. ? Drink enough fluid to keep your urine clear or pale yellow. Activity  Exercise only as directed by your health care provider. Most women can continue their usual exercise routine during  pregnancy. Try to exercise for 30 minutes at least 5 days a week. Exercising will help you: ? Control your weight. ? Stay in shape. ? Be prepared for labor and delivery.  Experiencing pain or cramping in the lower abdomen or lower back is a good sign that you should stop exercising. Check with your health care provider before continuing with normal exercises.  Try to avoid standing for long periods of time. Move your legs often if you must stand in one place for a long time.  Avoid heavy lifting.  Wear low-heeled shoes and practice good posture.  You may continue to have sex unless your health care provider tells you not to. Relieving pain and discomfort  Wear a good support bra to relieve breast tenderness.  Take warm sitz baths to soothe any pain or discomfort caused by hemorrhoids. Use hemorrhoid cream if your health care provider approves.  Rest with your legs elevated if you have leg cramps or low back pain.  If you   develop varicose veins in your legs, wear support hose. Elevate your feet for 15 minutes, 3-4 times a day. Limit salt in your diet. Prenatal care  Schedule your prenatal visits by the twelfth week of pregnancy. They are usually scheduled monthly at first, then more often in the last 2 months before delivery.  Write down your questions. Take them to your prenatal visits.  Keep all your prenatal visits as told by your health care provider. This is important. Safety  Wear your seat belt at all times when driving.  Make a list of emergency phone numbers, including numbers for family, friends, the hospital, and police and fire departments. General instructions  Ask your health care provider for a referral to a local prenatal education class. Begin classes no later than the beginning of month 6 of your pregnancy.  Ask for help if you have counseling or nutritional needs during pregnancy. Your health care provider can offer advice or refer you to specialists for help  with various needs.  Do not use hot tubs, steam rooms, or saunas.  Do not douche or use tampons or scented sanitary pads.  Do not cross your legs for long periods of time.  Avoid cat litter boxes and soil used by cats. These carry germs that can cause birth defects in the baby and possibly loss of the fetus by miscarriage or stillbirth.  Avoid all smoking, herbs, alcohol, and medicines not prescribed by your health care provider. Chemicals in these products affect the formation and growth of the baby.  Do not use any products that contain nicotine or tobacco, such as cigarettes and e-cigarettes. If you need help quitting, ask your health care provider. You may receive counseling support and other resources to help you quit.  Schedule a dentist appointment. At home, brush your teeth with a soft toothbrush and be gentle when you floss. Contact a health care provider if:  You have dizziness.  You have mild pelvic cramps, pelvic pressure, or nagging pain in the abdominal area.  You have persistent nausea, vomiting, or diarrhea.  You have a bad smelling vaginal discharge.  You have pain when you urinate.  You notice increased swelling in your face, hands, legs, or ankles.  You are exposed to fifth disease or chickenpox.  You are exposed to German measles (rubella) and have never had it. Get help right away if:  You have a fever.  You are leaking fluid from your vagina.  You have spotting or bleeding from your vagina.  You have severe abdominal cramping or pain.  You have rapid weight gain or loss.  You vomit blood or material that looks like coffee grounds.  You develop a severe headache.  You have shortness of breath.  You have any kind of trauma, such as from a fall or a car accident. Summary  The first trimester of pregnancy is from week 1 until the end of week 13 (months 1 through 3).  Your body goes through many changes during pregnancy. The changes vary from  woman to woman.  You will have routine prenatal visits. During those visits, your health care provider will examine you, discuss any test results you may have, and talk with you about how you are feeling. This information is not intended to replace advice given to you by your health care provider. Make sure you discuss any questions you have with your health care provider. Document Released: 04/20/2001 Document Revised: 04/07/2016 Document Reviewed: 04/07/2016 [employer] Interactive Patient Education  2018   [employer]   Second Trimester of Pregnancy The second trimester is from week 14 through week 27 (months 4 through 6). The second trimester is often a time when you feel your best. Your body has adjusted to being pregnant, and you begin to feel better physically. Usually, morning sickness has lessened or quit completely, you may have more energy, and you may have an increase in appetite. The second trimester is also a time when the fetus is growing rapidly. At the end of the sixth month, the fetus is about 9 inches long and weighs about 1 pounds. You will likely begin to feel the baby move (quickening) between 16 and 20 weeks of pregnancy. Body changes during your second trimester Your body continues to go through many changes during your second trimester. The changes vary from woman to woman.  Your weight will continue to increase. You will notice your lower abdomen bulging out.  You may begin to get stretch marks on your hips, abdomen, and breasts.  You may develop headaches that can be relieved by medicines. The medicines should be approved by your health care provider.  You may urinate more often because the fetus is pressing on your bladder.  You may develop or continue to have heartburn as a result of your pregnancy.  You may develop constipation because certain hormones are causing the muscles that push waste through your intestines to slow down.  You may develop hemorrhoids or  swollen, bulging veins (varicose veins).  You may have back pain. This is caused by: ? Weight gain. ? Pregnancy hormones that are relaxing the joints in your pelvis. ? A shift in weight and the muscles that support your balance.  Your breasts will continue to grow and they will continue to become tender.  Your gums may bleed and may be sensitive to brushing and flossing.  Dark spots or blotches (chloasma, mask of pregnancy) may develop on your face. This will likely fade after the baby is born.  A dark line from your belly button to the pubic area (linea nigra) may appear. This will likely fade after the baby is born.  You may have changes in your hair. These can include thickening of your hair, rapid growth, and changes in texture. Some women also have hair loss during or after pregnancy, or hair that feels dry or thin. Your hair will most likely return to normal after your baby is born.  What to expect at prenatal visits During a routine prenatal visit:  You will be weighed to make sure you and the fetus are growing normally.  Your blood pressure will be taken.  Your abdomen will be measured to track your baby's growth.  The fetal heartbeat will be listened to.  Any test results from the previous visit will be discussed.  Your health care provider may ask you:  How you are feeling.  If you are feeling the baby move.  If you have had any abnormal symptoms, such as leaking fluid, bleeding, severe headaches, or abdominal cramping.  If you are using any tobacco products, including cigarettes, chewing tobacco, and electronic cigarettes.  If you have any questions.  Other tests that may be performed during your second trimester include:  Blood tests that check for: ? Low iron levels (anemia). ? High blood sugar that affects pregnant women (gestational diabetes) between 24 and 28 weeks. ? Rh antibodies. This is to check for a protein on red blood cells (Rh factor).  Urine  tests to   check for infections, diabetes, or protein in the urine.  An ultrasound to confirm the proper growth and development of the baby.  An amniocentesis to check for possible genetic problems.  Fetal screens for spina bifida and Down syndrome.  HIV (human immunodeficiency virus) testing. Routine prenatal testing includes screening for HIV, unless you choose not to have this test.  Follow these instructions at home: Medicines  Follow your health care provider's instructions regarding medicine use. Specific medicines may be either safe or unsafe to take during pregnancy.  Take a prenatal vitamin that contains at least 600 micrograms (mcg) of folic acid.  If you develop constipation, try taking a stool softener if your health care provider approves. Eating and drinking  Eat a balanced diet that includes fresh fruits and vegetables, whole grains, good sources of protein such as meat, eggs, or tofu, and low-fat dairy. Your health care provider will help you determine the amount of weight gain that is right for you.  Avoid raw meat and uncooked cheese. These carry germs that can cause birth defects in the baby.  If you have low calcium intake from food, talk to your health care provider about whether you should take a daily calcium supplement.  Limit foods that are high in fat and processed sugars, such as fried and sweet foods.  To prevent constipation: ? Drink enough fluid to keep your urine clear or pale yellow. ? Eat foods that are high in fiber, such as fresh fruits and vegetables, whole grains, and beans. Activity  Exercise only as directed by your health care provider. Most women can continue their usual exercise routine during pregnancy. Try to exercise for 30 minutes at least 5 days a week. Stop exercising if you experience uterine contractions.  Avoid heavy lifting, wear low heel shoes, and practice good posture.  A sexual relationship may be continued unless your health  care provider directs you otherwise. Relieving pain and discomfort  Wear a good support bra to prevent discomfort from breast tenderness.  Take warm sitz baths to soothe any pain or discomfort caused by hemorrhoids. Use hemorrhoid cream if your health care provider approves.  Rest with your legs elevated if you have leg cramps or low back pain.  If you develop varicose veins, wear support hose. Elevate your feet for 15 minutes, 3-4 times a day. Limit salt in your diet. Prenatal Care  Write down your questions. Take them to your prenatal visits.  Keep all your prenatal visits as told by your health care provider. This is important. Safety  Wear your seat belt at all times when driving.  Make a list of emergency phone numbers, including numbers for family, friends, the hospital, and police and fire departments. General instructions  Ask your health care provider for a referral to a local prenatal education class. Begin classes no later than the beginning of month 6 of your pregnancy.  Ask for help if you have counseling or nutritional needs during pregnancy. Your health care provider can offer advice or refer you to specialists for help with various needs.  Do not use hot tubs, steam rooms, or saunas.  Do not douche or use tampons or scented sanitary pads.  Do not cross your legs for long periods of time.  Avoid cat litter boxes and soil used by cats. These carry germs that can cause birth defects in the baby and possibly loss of the fetus by miscarriage or stillbirth.  Avoid all smoking, herbs, alcohol, and unprescribed drugs. Chemicals   in these products can affect the formation and growth of the baby.  Do not use any products that contain nicotine or tobacco, such as cigarettes and e-cigarettes. If you need help quitting, ask your health care provider.  Visit your dentist if you have not gone yet during your pregnancy. Use a soft toothbrush to brush your teeth and be gentle when  you floss. Contact a health care provider if:  You have dizziness.  You have mild pelvic cramps, pelvic pressure, or nagging pain in the abdominal area.  You have persistent nausea, vomiting, or diarrhea.  You have a bad smelling vaginal discharge.  You have pain when you urinate. Get help right away if:  You have a fever.  You are leaking fluid from your vagina.  You have spotting or bleeding from your vagina.  You have severe abdominal cramping or pain.  You have rapid weight gain or weight loss.  You have shortness of breath with chest pain.  You notice sudden or extreme swelling of your face, hands, ankles, feet, or legs.  You have not felt your baby move in over an hour.  You have severe headaches that do not go away when you take medicine.  You have vision changes. Summary  The second trimester is from week 14 through week 27 (months 4 through 6). It is also a time when the fetus is growing rapidly.  Your body goes through many changes during pregnancy. The changes vary from woman to woman.  Avoid all smoking, herbs, alcohol, and unprescribed drugs. These chemicals affect the formation and growth your baby.  Do not use any tobacco products, such as cigarettes, chewing tobacco, and e-cigarettes. If you need help quitting, ask your health care provider.  Contact your health care provider if you have any questions. Keep all prenatal visits as told by your health care provider. This is important. This information is not intended to replace advice given to you by your health care provider. Make sure you discuss any questions you have with your health care provider. Document Released: 04/20/2001 Document Revised: 06/01/2016 Document Reviewed: 06/01/2016 [employer] Interactive Patient Education  2018 [employer]   Contraception Choices Contraception, also called birth control, refers to methods or devices that prevent pregnancy. Hormonal methods Contraceptive  implant A contraceptive implant is a thin, plastic tube that contains a hormone. It is inserted into the upper part of the arm. It can remain in place for up to 3 years. Progestin-only injections Progestin-only injections are injections of progestin, a synthetic form of the hormone progesterone. They are given every 3 months by a health care provider. Birth control pills Birth control pills are pills that contain hormones that prevent pregnancy. They must be taken once a day, preferably at the same time each day. Birth control patch The birth control patch contains hormones that prevent pregnancy. It is placed on the skin and must be changed once a week for three weeks and removed on the fourth week. A prescription is needed to use this method of contraception. Vaginal ring A vaginal ring contains hormones that prevent pregnancy. It is placed in the vagina for three weeks and removed on the fourth week. After that, the process is repeated with a new ring. A prescription is needed to use this method of contraception. Emergency contraceptive Emergency contraceptives prevent pregnancy after unprotected sex. They come in pill form and can be taken up to 5 days after sex. They work best the sooner they are taken after having   sex. Most emergency contraceptives are available without a prescription. This method should not be used as your only form of birth control. Barrier methods Female condom A female condom is a thin sheath that is worn over the penis during sex. Condoms keep sperm from going inside a woman's body. They can be used with a spermicide to increase their effectiveness. They should be disposed after a single use. Female condom A female condom is a soft, loose-fitting sheath that is put into the vagina before sex. The condom keeps sperm from going inside a woman's body. They should be disposed after a single use. Diaphragm A diaphragm is a soft, dome-shaped barrier. It is inserted into the vagina  before sex, along with a spermicide. The diaphragm blocks sperm from entering the uterus, and the spermicide kills sperm. A diaphragm should be left in the vagina for 6-8 hours after sex and removed within 24 hours. A diaphragm is prescribed and fitted by a health care provider. A diaphragm should be replaced every 1-2 years, after giving birth, after gaining more than 15 lb (6.8 kg), and after pelvic surgery. Cervical cap A cervical cap is a round, soft latex or plastic cup that fits over the cervix. It is inserted into the vagina before sex, along with spermicide. It blocks sperm from entering the uterus. The cap should be left in place for 6-8 hours after sex and removed within 48 hours. A cervical cap must be prescribed and fitted by a health care provider. It should be replaced every 2 years. Sponge A sponge is a soft, circular piece of polyurethane foam with spermicide on it. The sponge helps block sperm from entering the uterus, and the spermicide kills sperm. To use it, you make it wet and then insert it into the vagina. It should be inserted before sex, left in for at least 6 hours after sex, and removed and thrown away within 30 hours. Spermicides Spermicides are chemicals that kill or block sperm from entering the cervix and uterus. They can come as a cream, jelly, suppository, foam, or tablet. A spermicide should be inserted into the vagina with an applicator at least 10-15 minutes before sex to allow time for it to work. The process must be repeated every time you have sex. Spermicides do not require a prescription. Intrauterine contraception Intrauterine device (IUD) An IUD is a T-shaped device that is put in a woman's uterus. There are two types:  Hormone IUD.This type contains progestin, a synthetic form of the hormone progesterone. This type can stay in place for 3-5 years.  Copper IUD.This type is wrapped in copper wire. It can stay in place for 10 years.  Permanent methods of  contraception Female tubal ligation In this method, a woman's fallopian tubes are sealed, tied, or blocked during surgery to prevent eggs from traveling to the uterus. Hysteroscopic sterilization In this method, a small, flexible insert is placed into each fallopian tube. The inserts cause scar tissue to form in the fallopian tubes and block them, so sperm cannot reach an egg. The procedure takes about 3 months to be effective. Another form of birth control must be used during those 3 months. Female sterilization This is a procedure to tie off the tubes that carry sperm (vasectomy). After the procedure, the man can still ejaculate fluid (semen). Natural planning methods Natural family planning In this method, a couple does not have sex on days when the woman could become pregnant. Calendar method This means keeping track of the   length of each menstrual cycle, identifying the days when pregnancy can happen, and not having sex on those days. Ovulation method In this method, a couple avoids sex during ovulation. Symptothermal method This method involves not having sex during ovulation. The woman typically checks for ovulation by watching changes in her temperature and in the consistency of cervical mucus. Post-ovulation method In this method, a couple waits to have sex until after ovulation. Summary  Contraception, also called birth control, means methods or devices that prevent pregnancy.  Hormonal methods of contraception include implants, injections, pills, patches, vaginal rings, and emergency contraceptives.  Barrier methods of contraception can include female condoms, female condoms, diaphragms, cervical caps, sponges, and spermicides.  There are two types of IUDs (intrauterine devices). An IUD can be put in a woman's uterus to prevent pregnancy for 3-5 years.  Permanent sterilization can be done through a procedure for males, females, or both.  Natural family planning methods involve  not having sex on days when the woman could become pregnant. This information is not intended to replace advice given to you by your health care provider. Make sure you discuss any questions you have with your health care provider. Document Released: 04/26/2005 Document Revised: 05/29/2016 Document Reviewed: 05/29/2016 Elsevier Interactive Patient Education  2018 [employer] Inc.   Breastfeeding Choosing to breastfeed is one of the best decisions you can make for yourself and your baby. A change in hormones during pregnancy causes your breasts to make breast milk in your milk-producing glands. Hormones prevent breast milk from being released before your baby is born. They also prompt milk flow after birth. Once breastfeeding has begun, thoughts of your baby, as well as his or her sucking or crying, can stimulate the release of milk from your milk-producing glands. Benefits of breastfeeding Research shows that breastfeeding offers many health benefits for infants and mothers. It also offers a cost-free and convenient way to feed your baby. For your baby  Your first milk (colostrum) helps your baby's digestive system to function better.  Special cells in your milk (antibodies) help your baby to fight off infections.  Breastfed babies are less likely to develop asthma, allergies, obesity, or type 2 diabetes. They are also at lower risk for sudden infant death syndrome (SIDS).  Nutrients in breast milk are better able to meet your baby's needs compared to infant formula.  Breast milk improves your baby's brain development. For you  Breastfeeding helps to create a very special bond between you and your baby.  Breastfeeding is convenient. Breast milk costs nothing and is always available at the correct temperature.  Breastfeeding helps to burn calories. It helps you to lose the weight that you gained during pregnancy.  Breastfeeding makes your uterus return faster to its size before pregnancy.  It also slows bleeding (lochia) after you give birth.  Breastfeeding helps to lower your risk of developing type 2 diabetes, osteoporosis, rheumatoid arthritis, cardiovascular disease, and breast, ovarian, uterine, and endometrial cancer later in life. Breastfeeding basics Starting breastfeeding  Find a comfortable place to sit or lie down, with your neck and back well-supported.  Place a pillow or a rolled-up blanket under your baby to bring him or her to the level of your breast (if you are seated). Nursing pillows are specially designed to help support your arms and your baby while you breastfeed.  Make sure that your baby's tummy (abdomen) is facing your abdomen.  Gently massage your breast. With your fingertips, massage from the outer edges of   your breast inward toward the nipple. This encourages milk flow. If your milk flows slowly, you may need to continue this action during the feeding.  Support your breast with 4 fingers underneath and your thumb above your nipple (make the letter "C" with your hand). Make sure your fingers are well away from your nipple and your baby's mouth.  Stroke your baby's lips gently with your finger or nipple.  When your baby's mouth is open wide enough, quickly bring your baby to your breast, placing your entire nipple and as much of the areola as possible into your baby's mouth. The areola is the colored area around your nipple. ? More areola should be visible above your baby's upper lip than below the lower lip. ? Your baby's lips should be opened and extended outward (flanged) to ensure an adequate, comfortable latch. ? Your baby's tongue should be between his or her lower gum and your breast.  Make sure that your baby's mouth is correctly positioned around your nipple (latched). Your baby's lips should create a seal on your breast and be turned out (everted).  It is common for your baby to suck about 2-3 minutes in order to start the flow of breast  milk. Latching Teaching your baby how to latch onto your breast properly is very important. An improper latch can cause nipple pain, decreased milk supply, and poor weight gain in your baby. Also, if your baby is not latched onto your nipple properly, he or she may swallow some air during feeding. This can make your baby fussy. Burping your baby when you switch breasts during the feeding can help to get rid of the air. However, teaching your baby to latch on properly is still the best way to prevent fussiness from swallowing air while breastfeeding. Signs that your baby has successfully latched onto your nipple  Silent tugging or silent sucking, without causing you pain. Infant's lips should be extended outward (flanged).  Swallowing heard between every 3-4 sucks once your milk has started to flow (after your let-down milk reflex occurs).  Muscle movement above and in front of his or her ears while sucking.  Signs that your baby has not successfully latched onto your nipple  Sucking sounds or smacking sounds from your baby while breastfeeding.  Nipple pain.  If you think your baby has not latched on correctly, slip your finger into the corner of your baby's mouth to break the suction and place it between your baby's gums. Attempt to start breastfeeding again. Signs of successful breastfeeding Signs from your baby  Your baby will gradually decrease the number of sucks or will completely stop sucking.  Your baby will fall asleep.  Your baby's body will relax.  Your baby will retain a small amount of milk in his or her mouth.  Your baby will let go of your breast by himself or herself.  Signs from you  Breasts that have increased in firmness, weight, and size 1-3 hours after feeding.  Breasts that are softer immediately after breastfeeding.  Increased milk volume, as well as a change in milk consistency and color by the fifth day of breastfeeding.  Nipples that are not sore,  cracked, or bleeding.  Signs that your baby is getting enough milk  Wetting at least 1-2 diapers during the first 24 hours after birth.  Wetting at least 5-6 diapers every 24 hours for the first week after birth. The urine should be clear or pale yellow by the age of 5   days.  Wetting 6-8 diapers every 24 hours as your baby continues to grow and develop.  At least 3 stools in a 24-hour period by the age of 5 days. The stool should be soft and yellow.  At least 3 stools in a 24-hour period by the age of 7 days. The stool should be seedy and yellow.  No loss of weight greater than 10% of birth weight during the first 3 days of life.  Average weight gain of 4-7 oz (113-198 g) per week after the age of 4 days.  Consistent daily weight gain by the age of 5 days, without weight loss after the age of 2 weeks. After a feeding, your baby may spit up a small amount of milk. This is normal. Breastfeeding frequency and duration Frequent feeding will help you make more milk and can prevent sore nipples and extremely full breasts (breast engorgement). Breastfeed when you feel the need to reduce the fullness of your breasts or when your baby shows signs of hunger. This is called "breastfeeding on demand." Signs that your baby is hungry include:  Increased alertness, activity, or restlessness.  Movement of the head from side to side.  Opening of the mouth when the corner of the mouth or cheek is stroked (rooting).  Increased sucking sounds, smacking lips, cooing, sighing, or squeaking.  Hand-to-mouth movements and sucking on fingers or hands.  Fussing or crying.  Avoid introducing a pacifier to your baby in the first 4-6 weeks after your baby is born. After this time, you may choose to use a pacifier. Research has shown that pacifier use during the first year of a baby's life decreases the risk of sudden infant death syndrome (SIDS). Allow your baby to feed on each breast as long as he or she  wants. When your baby unlatches or falls asleep while feeding from the first breast, offer the second breast. Because newborns are often sleepy in the first few weeks of life, you may need to awaken your baby to get him or her to feed. Breastfeeding times will vary from baby to baby. However, the following rules can serve as a guide to help you make sure that your baby is properly fed:  Newborns (babies 4 weeks of age or younger) may breastfeed every 1-3 hours.  Newborns should not go without breastfeeding for longer than 3 hours during the day or 5 hours during the night.  You should breastfeed your baby a minimum of 8 times in a 24-hour period.  Breast milk pumping Pumping and storing breast milk allows you to make sure that your baby is exclusively fed your breast milk, even at times when you are unable to breastfeed. This is especially important if you go back to work while you are still breastfeeding, or if you are not able to be present during feedings. Your lactation consultant can help you find a method of pumping that works best for you and give you guidelines about how long it is safe to store breast milk. Caring for your breasts while you breastfeed Nipples can become dry, cracked, and sore while breastfeeding. The following recommendations can help keep your breasts moisturized and healthy:  Avoid using soap on your nipples.  Wear a supportive bra designed especially for nursing. Avoid wearing underwire-style bras or extremely tight bras (sports bras).  Air-dry your nipples for 3-4 minutes after each feeding.  Use only cotton bra pads to absorb leaked breast milk. Leaking of breast milk between feedings is normal.    Use lanolin on your nipples after breastfeeding. Lanolin helps to maintain your skin's normal moisture barrier. Pure lanolin is not harmful (not toxic) to your baby. You may also hand express a few drops of breast milk and gently massage that milk into your nipples and  allow the milk to air-dry.  In the first few weeks after giving birth, some women experience breast engorgement. Engorgement can make your breasts feel heavy, warm, and tender to the touch. Engorgement peaks within 3-5 days after you give birth. The following recommendations can help to ease engorgement:  Completely empty your breasts while breastfeeding or pumping. You may want to start by applying warm, moist heat (in the shower or with warm, water-soaked hand towels) just before feeding or pumping. This increases circulation and helps the milk flow. If your baby does not completely empty your breasts while breastfeeding, pump any extra milk after he or she is finished.  Apply ice packs to your breasts immediately after breastfeeding or pumping, unless this is too uncomfortable for you. To do this: ? Put ice in a plastic bag. ? Place a towel between your skin and the bag. ? Leave the ice on for 20 minutes, 2-3 times a day.  Make sure that your baby is latched on and positioned properly while breastfeeding.  If engorgement persists after 48 hours of following these recommendations, contact your health care provider or a lactation consultant. Overall health care recommendations while breastfeeding  Eat 3 healthy meals and 3 snacks every day. Well-nourished mothers who are breastfeeding need an additional 450-500 calories a day. You can meet this requirement by increasing the amount of a balanced diet that you eat.  Drink enough water to keep your urine pale yellow or clear.  Rest often, relax, and continue to take your prenatal vitamins to prevent fatigue, stress, and low vitamin and mineral levels in your body (nutrient deficiencies).  Do not use any products that contain nicotine or tobacco, such as cigarettes and e-cigarettes. Your baby may be harmed by chemicals from cigarettes that pass into breast milk and exposure to secondhand smoke. If you need help quitting, ask your health care  provider.  Avoid alcohol.  Do not use illegal drugs or marijuana.  Talk with your health care provider before taking any medicines. These include over-the-counter and prescription medicines as well as vitamins and herbal supplements. Some medicines that may be harmful to your baby can pass through breast milk.  It is possible to become pregnant while breastfeeding. If birth control is desired, ask your health care provider about options that will be safe while breastfeeding your baby. Where to find more information: [employer]: www.llli.org Contact a health care provider if:  You feel like you want to stop breastfeeding or have become frustrated with breastfeeding.  Your nipples are cracked or bleeding.  Your breasts are red, tender, or warm.  You have: ? Painful breasts or nipples. ? A swollen area on either breast. ? A fever or chills. ? Nausea or vomiting. ? Drainage other than breast milk from your nipples.  Your breasts do not become full before feedings by the fifth day after you give birth.  You feel sad and depressed.  Your baby is: ? Too sleepy to eat well. ? Having trouble sleeping. ? More than 1 week old and wetting fewer than 6 diapers in a 24-hour period. ? Not gaining weight by 5 days of age.  Your baby has fewer than 3 stools in a   24-hour period.  Your baby's skin or the white parts of his or her eyes become yellow. Get help right away if:  Your baby is overly tired (lethargic) and does not want to wake up and feed.  Your baby develops an unexplained fever. Summary  Breastfeeding offers many health benefits for infant and mothers.  Try to breastfeed your infant when he or she shows early signs of hunger.  Gently tickle or stroke your baby's lips with your finger or nipple to allow the baby to open his or her mouth. Bring the baby to your breast. Make sure that much of the areola is in your baby's mouth. Offer one side and burp the  baby before you offer the other side.  Talk with your health care provider or lactation consultant if you have questions or you face problems as you breastfeed. This information is not intended to replace advice given to you by your health care provider. Make sure you discuss any questions you have with your health care provider. Document Released: 04/26/2005 Document Revised: 05/28/2016 Document Reviewed: 05/28/2016 Elsevier Interactive Patient Education  2018 Elsevier Inc.  

## 2018-02-15 LAB — CYTOLOGY - PAP
Diagnosis: NEGATIVE
HPV (WINDOPATH): NOT DETECTED

## 2018-02-15 LAB — URINE CULTURE, OB REFLEX

## 2018-02-15 LAB — CULTURE, OB URINE

## 2018-02-20 ENCOUNTER — Telehealth: Payer: Self-pay

## 2018-02-20 NOTE — Telephone Encounter (Signed)
Return call to pt from vm  Pt requesting panorama results Results were scanned today and have not been reviewed by provider yet. Call to let pt know once reviewed results can be viewed on [employer] site. Pt not ava and VM full.

## 2018-02-21 LAB — HEMOGLOBINOPATHY EVALUATION
HEMOGLOBIN F QUANTITATION: 0 % (ref 0.0–2.0)
HGB A: 97.9 % (ref 96.4–98.8)
HGB C: 0 %
HGB S: 0 %
HGB VARIANT: 0 %
Hemoglobin A2 Quantitation: 2.1 % (ref 1.8–3.2)

## 2018-02-21 LAB — OBSTETRIC PANEL, INCLUDING HIV
ANTIBODY SCREEN: NEGATIVE
BASOS ABS: 0 10*3/uL (ref 0.0–0.2)
BASOS: 0 %
EOS (ABSOLUTE): 0 10*3/uL (ref 0.0–0.4)
Eos: 1 %
HIV SCREEN 4TH GENERATION: NONREACTIVE
Hematocrit: 37 % (ref 34.0–46.6)
Hemoglobin: 11.3 g/dL (ref 11.1–15.9)
Hepatitis B Surface Ag: NEGATIVE
Immature Grans (Abs): 0 10*3/uL (ref 0.0–0.1)
Immature Granulocytes: 0 %
LYMPHS ABS: 1.8 10*3/uL (ref 0.7–3.1)
Lymphs: 34 %
MCH: 24.1 pg — AB (ref 26.6–33.0)
MCHC: 30.5 g/dL — AB (ref 31.5–35.7)
MCV: 79 fL (ref 79–97)
MONOS ABS: 0.5 10*3/uL (ref 0.1–0.9)
Monocytes: 9 %
Neutrophils Absolute: 3 10*3/uL (ref 1.4–7.0)
Neutrophils: 56 %
PLATELETS: 283 10*3/uL (ref 150–450)
RBC: 4.69 x10E6/uL (ref 3.77–5.28)
RDW: 18 % — AB (ref 12.3–15.4)
RPR Ser Ql: NONREACTIVE
Rh Factor: POSITIVE
Rubella Antibodies, IGG: 1.88 index (ref >0.99)
WBC: 5.4 10*3/uL (ref 3.4–10.8)

## 2018-02-21 LAB — CYSTIC FIBROSIS MUTATION 97: GENE DIS ANAL CARRIER INTERP BLD/T-IMP: NOT DETECTED

## 2018-02-21 LAB — SMN1 COPY NUMBER ANALYSIS (SMA CARRIER SCREENING)

## 2018-02-28 ENCOUNTER — Encounter (HOSPITAL_COMMUNITY): Payer: Self-pay | Admitting: *Deleted

## 2018-02-28 ENCOUNTER — Other Ambulatory Visit: Payer: Self-pay

## 2018-02-28 ENCOUNTER — Inpatient Hospital Stay (HOSPITAL_COMMUNITY)
Admission: AD | Admit: 2018-02-28 | Discharge: 2018-02-28 | Disposition: A | Discharge status: Home / Self Care | Payer: BLUE CROSS/BLUE SHIELD | Source: Ambulatory Visit | Attending: Family Medicine | Admitting: Family Medicine

## 2018-02-28 DIAGNOSIS — R102 Pelvic and perineal pain: Secondary | ICD-10-CM | POA: Diagnosis not present

## 2018-02-28 DIAGNOSIS — Z3A13 13 weeks gestation of pregnancy: Secondary | ICD-10-CM | POA: Diagnosis not present

## 2018-02-28 DIAGNOSIS — O26892 Other specified pregnancy related conditions, second trimester: Secondary | ICD-10-CM | POA: Diagnosis not present

## 2018-02-28 DIAGNOSIS — O26891 Other specified pregnancy related conditions, first trimester: Secondary | ICD-10-CM | POA: Diagnosis not present

## 2018-02-28 DIAGNOSIS — R109 Unspecified abdominal pain: Secondary | ICD-10-CM | POA: Diagnosis not present

## 2018-02-28 DIAGNOSIS — O26899 Other specified pregnancy related conditions, unspecified trimester: Secondary | ICD-10-CM

## 2018-02-28 LAB — URINALYSIS, ROUTINE W REFLEX MICROSCOPIC
Bilirubin Urine: NEGATIVE
Glucose, UA: NEGATIVE mg/dL
Hgb urine dipstick: NEGATIVE
Ketones, ur: NEGATIVE mg/dL
Leukocytes, UA: NEGATIVE
Nitrite: NEGATIVE
Protein, ur: NEGATIVE mg/dL
Specific Gravity, Urine: 1.021 (ref 1.005–1.030)
pH: 6 (ref 5.0–8.0)

## 2018-02-28 MED ORDER — ACETAMINOPHEN 500 MG PO TABS
1000.0000 mg | ORAL_TABLET | Freq: Once | ORAL | Status: AC
  Administered 2018-02-28: 1000 mg via ORAL
  Filled 2018-02-28: qty 2

## 2018-02-28 NOTE — MAU Provider Note (Signed)
History     CSN: [id number]  Arrival date and time: 02/28/18 2012   First Provider Initiated Contact with Patient 02/28/18 2105      Chief Complaint  Patient presents with  . Abdominal Pain   Danielle Bradley is a 22 y.o. G2P1 at [redacted]w[redacted]d who presents to MAU with complaints of abdominal pain. She reports abdominal pain has been occurring for the past 2 weeks and got worse today. She reports lower abdominal sharp pain and cramping that gets worse with movement, standing and bending over. She rates pain 8/10- has not taken any medication prior to arrival to MAU. She denies recent IC, vaginal bleeding, vaginal discharge, HA or urinary symptoms. She receives prenatal care at [facility] and next appointment is 11/4.    OB History    Gravida  2   Para  1   Term  1   Preterm      AB      Living  1     SAB      TAB      Ectopic      Multiple  0   Live Births  1           Past Medical History:  Diagnosis Date  . 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 WISDOM TOOTH EXTRACTION      Family History  Problem Relation Age of Onset  . Neurofibromatosis Mother   . Neurofibromatosis Sister   . Neurofibromatosis Maternal Grandmother     Social History   Tobacco Use  . Smoking status: Never Smoker  . Smokeless tobacco: Never Used  Substance Use Topics  . Alcohol use: No    Alcohol/week: 0.0 standard drinks  . Drug use: No    Allergies: No Known Allergies  Medications Prior to Admission  Medication Sig Dispense Refill Last Dose  . Prenatal Vit-Fe Fumarate-FA (MULTIVITAMIN-PRENATAL) 27-0.8 MG TABS tablet Take 1 tablet by mouth daily at 12 noon.   02/27/2018 at Unknown time  . Doxylamine-Pyridoxine (DICLEGIS) 10-10 MG TBEC Take 2 tablets by mouth at bedtime. If symptoms persist, add one tablet in the morning and one in the afternoon 100 tablet 5   . ondansetron (ZOFRAN ODT) 4 MG disintegrating tablet Take 1 tablet (4  mg total) by mouth every 8 (eight) hours as needed for nausea or vomiting. (Patient not taking: Reported on 02/13/2018) 12 tablet 0 Not Taking  . ondansetron (ZOFRAN) 4 MG tablet Take 1 tablet (4 mg total) by mouth every 6 (six) hours. (Patient not taking: Reported on 02/13/2018) 15 tablet 0 Not Taking    Review of Systems  Constitutional: Negative.   Respiratory: Negative.   Cardiovascular: Negative.   Gastrointestinal: Positive for abdominal pain. Negative for constipation, diarrhea, nausea and vomiting.  Genitourinary: Negative.   Neurological: Negative.    Physical Exam   Blood pressure 115/71, pulse (!) 107, temperature 98.6 F (37 C), temperature source Oral, resp. rate 16, height 5\' 1"  (1.549 m), weight 44.2 kg, last menstrual period 11/27/2017.  Physical Exam  Nursing note and vitals reviewed. Constitutional: She is oriented to person, place, and time. She appears well-developed and well-nourished. No distress.  Cardiovascular: Normal rate, regular rhythm and normal heart sounds.  Respiratory: Breath sounds normal. No respiratory distress. She has no wheezes.  GI: Soft. She exhibits no distension. There is no tenderness. There is no rebound.  Genitourinary: No vaginal discharge found.  Neurological: She is alert and  oriented to person, place, and time.  Psychiatric: She has a normal mood and affect. Her behavior is normal. Thought content normal.   Dilation: Closed Effacement (%): Thick Exam by:: Lanice Shirts CNM  MAU Course  Procedures  MDM Tylenol 1000mg  for abdominal pain  Bedside US  Cervical examination  UA- negative   Pt informed that the ultrasound is considered a limited OB ultrasound and is not intended to be a complete ultrasound exam.  Patient also informed that the ultrasound is not being completed with the intent of assessing for fetal or placental anomalies or any pelvic abnormalities.  Explained that the purpose of today's ultrasound is to assess for  placenta  presentation and viability.  Patient acknowledges the purpose of the exam and the limitations of the study.    FHR 150s by bedside US and anterior placenta noted   Treatments in MAU including Tylenol 1000mg  for abdominal pain. Patient reports decreased abdominal pain after medication.   Discussed reasons to return to MAU. Follow up as scheduled in the office for prenatal care. Safe medications during pregnancy discussed. Pt stable at time of discharge.   Assessment and Plan   1. Abdominal pain during pregnancy in second trimester   2. Pain of round ligament during pregnancy   3. [redacted] weeks gestation of pregnancy    Discharge home  Follow up as scheduled  Return to MAU as needed  Continue taking prenatal vitamins  Tylenol as needed for RLP, recommended support belt for later in pregnancy   Follow-up Information    [facility] Follow up.   Specialty:  Obstetrics and Gynecology Why:  Follow up as scheduled for prenatal appointments and return to MAU as needed  Contact information: [address] [place] [address] [phone]          Allergies as of 02/28/2018   No Known Allergies     Medication List    TAKE these medications   Doxylamine-Pyridoxine 10-10 MG Tbec Take 2 tablets by mouth at bedtime. If symptoms persist, add one tablet in the morning and one in the afternoon   multivitamin-prenatal 27-0.8 MG Tabs tablet Take 1 tablet by mouth daily at 12 noon.   ondansetron 4 MG disintegrating tablet Commonly known as:  ZOFRAN-ODT Take 1 tablet (4 mg total) by mouth every 8 (eight) hours as needed for nausea or vomiting.   ondansetron 4 MG tablet Commonly known as:  ZOFRAN Take 1 tablet (4 mg total) by mouth every 6 (six) hours.       Sharyon Cable CNM 02/28/2018, 9:38 PM

## 2018-02-28 NOTE — MAU Note (Signed)
Pt presents to MAU c/o lower abdominal pain (cramping) that is coming and going for the last few weeks pt states she was evaluated for it 2 weeks back but today the pain is worse. Pt states the pain today is a 8/10. No bleeding or vaginal discharge.

## 2018-03-03 ENCOUNTER — Inpatient Hospital Stay (HOSPITAL_COMMUNITY)
Admission: AD | Admit: 2018-03-03 | Discharge: 2018-03-04 | Disposition: A | Discharge status: Home / Self Care | Payer: BLUE CROSS/BLUE SHIELD | Source: Ambulatory Visit | Attending: Obstetrics & Gynecology | Admitting: Obstetrics & Gynecology

## 2018-03-03 DIAGNOSIS — R42 Dizziness and giddiness: Secondary | ICD-10-CM | POA: Diagnosis present

## 2018-03-03 DIAGNOSIS — Z3A14 14 weeks gestation of pregnancy: Secondary | ICD-10-CM | POA: Diagnosis not present

## 2018-03-03 DIAGNOSIS — O99512 Diseases of the respiratory system complicating pregnancy, second trimester: Secondary | ICD-10-CM | POA: Diagnosis not present

## 2018-03-03 DIAGNOSIS — J069 Acute upper respiratory infection, unspecified: Secondary | ICD-10-CM | POA: Insufficient documentation

## 2018-03-03 NOTE — MAU Note (Signed)
When I woke up Thurs I felt bad and dizzy. Today has been worse. Able to eat and drink. Have nasal congestion, sharp pain in chest on occ, cough, and headache. Cough is non-productive. No meds taken. Has sheet with safe meds for pregnancy but too busy to get them

## 2018-03-04 ENCOUNTER — Encounter (HOSPITAL_COMMUNITY): Payer: Self-pay | Admitting: *Deleted

## 2018-03-04 DIAGNOSIS — O99512 Diseases of the respiratory system complicating pregnancy, second trimester: Secondary | ICD-10-CM | POA: Diagnosis not present

## 2018-03-04 LAB — URINALYSIS, ROUTINE W REFLEX MICROSCOPIC
Bilirubin Urine: NEGATIVE
GLUCOSE, UA: NEGATIVE mg/dL
Hgb urine dipstick: NEGATIVE
Ketones, ur: 20 mg/dL — AB
LEUKOCYTES UA: NEGATIVE
Nitrite: NEGATIVE
Protein, ur: NEGATIVE mg/dL
Specific Gravity, Urine: 1.025 (ref 1.005–1.030)
pH: 7 (ref 5.0–8.0)

## 2018-03-04 NOTE — MAU Provider Note (Signed)
Chief Complaint: Cough; Headache; Dizziness; and Nasal Congestion   None     SUBJECTIVE HPI: Danielle Bradley is a 22 y.o. G2P1001 at [redacted]w[redacted]d by LMP who presents to maternity admissions reporting cough, congestion, headache and dizziness x 24 hours, worsening today. She has not tried any treatments.  There are no other symptoms. She denies fever.  She reports she is eating and drinking regularly. She denies abdominal pain, vaginal bleeding, vaginal itching/burning, urinary symptoms, h/a, dizziness, n/v, or fever/chills.     HPI  Past Medical History:  Diagnosis Date  . 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 WISDOM TOOTH EXTRACTION     Social History   Socioeconomic History  . Marital status: Single    Spouse name: Not on file  . Number of children: Not on file  . Years of education: Not on file  . Highest education level: Not on file  Occupational History  . Not on file  Social Needs  . Financial resource strain: Not on file  . Food insecurity:    Worry: Not on file    Inability: Not on file  . Transportation needs:    Medical: Not on file    Non-medical: Not on file  Tobacco Use  . Smoking status: Never Smoker  . Smokeless tobacco: Never Used  Substance and Sexual Activity  . Alcohol use: No    Alcohol/week: 0.0 standard drinks  . Drug use: No  . Sexual activity: Yes    Birth control/protection: None  Lifestyle  . Physical activity:    Days per week: Not on file    Minutes per session: Not on file  . Stress: Not on file  Relationships  . Social connections:    Talks on phone: Not on file    Gets together: Not on file    Attends religious service: Not on file    Active member of club or organization: Not on file    Attends meetings of clubs or organizations: Not on file    Relationship status: Not on file  . Intimate partner violence:    Fear of current or ex partner: Not on file    Emotionally abused: Not  on file    Physically abused: Not on file    Forced sexual activity: Not on file  Other Topics Concern  . Not on file  Social History Narrative  . Not on file   No current facility-administered medications on file prior to encounter.    Current Outpatient Medications on File Prior to Encounter  Medication Sig Dispense Refill  . Doxylamine-Pyridoxine (DICLEGIS) 10-10 MG TBEC Take 2 tablets by mouth at bedtime. If symptoms persist, add one tablet in the morning and one in the afternoon 100 tablet 5  . ondansetron (ZOFRAN ODT) 4 MG disintegrating tablet Take 1 tablet (4 mg total) by mouth every 8 (eight) hours as needed for nausea or vomiting. (Patient not taking: Reported on 02/13/2018) 12 tablet 0  . ondansetron (ZOFRAN) 4 MG tablet Take 1 tablet (4 mg total) by mouth every 6 (six) hours. (Patient not taking: Reported on 02/13/2018) 15 tablet 0  . Prenatal Vit-Fe Fumarate-FA (MULTIVITAMIN-PRENATAL) 27-0.8 MG TABS tablet Take 1 tablet by mouth daily at 12 noon.     No Known Allergies  ROS:  Review of Systems  Constitutional: Negative for chills, fatigue and fever.  HENT: Positive for congestion.   Eyes: Negative for visual disturbance.  Respiratory: Negative for  shortness of breath.   Cardiovascular: Negative for chest pain.  Gastrointestinal: Negative for abdominal pain, nausea and vomiting.  Genitourinary: Negative for difficulty urinating, dysuria, flank pain, pelvic pain, vaginal bleeding, vaginal discharge and vaginal pain.  Neurological: Negative for dizziness and headaches.  Psychiatric/Behavioral: Negative.      I have reviewed patient's Past Medical Hx, Surgical Hx, Family Hx, Social Hx, medications and allergies.   Physical Exam   Patient Vitals for the past 24 hrs:  BP Temp Pulse Resp  03/04/18 0055 111/68 98 F (36.7 C) 95 18   Constitutional: Well-developed, well-nourished female in no acute distress.  Cardiovascular: normal rate Respiratory: normal effort GI:  Abd soft, non-tender. Pos BS x 4 MS: Extremities nontender, no edema, normal ROM Neurologic: Alert and oriented x 4.  GU: Neg CVAT.  FHT 152 with doppler  LAB RESULTS No results found for this or any previous visit (from the past 24 hour(s)).  O/Positive/-- (10/07 1026)  IMAGING   MAU Management/MDM: Ordered labs and reviewed results.  Pt is stable, has not tried any treatments for URI. Safe OTC meds list given. No sore throat or fever, making strep or influenza less llikely. Increase PO fluids.  Return to MAU for emergencies. Keep schsduled pt prenatal care visits, return to MAU as needed for emergencies. Note to miss work tomorrow for rest Pt discharged with strict return precautions.  ASSESSMENT  1. Viral upper respiratory tract infection   2. [redacted] weeks gestation of pregnancy    PLAN Discharge home Allergies as of 03/04/2018   No Known Allergies     Medication List    ASK your doctor about these medications   Doxylamine-Pyridoxine 10-10 MG Tbec Take 2 tablets by mouth at bedtime. If symptoms persist, add one tablet in the morning and one in the afternoon   multivitamin-prenatal 27-0.8 MG Tabs tablet Take 1 tablet by mouth daily at 12 noon.   ondansetron 4 MG disintegrating tablet Commonly known as:  ZOFRAN-ODT Take 1 tablet (4 mg total) by mouth every 8 (eight) hours as needed for nausea or vomiting.   ondansetron 4 MG tablet Commonly known as:  ZOFRAN Take 1 tablet (4 mg total) by mouth every 6 (six) hours.        Sharen Counter Certified Nurse-Midwife 03/05/2018  12:15 AM

## 2018-03-04 NOTE — Progress Notes (Signed)
Sharen Counter CNM in Triage to see pt and discuss lab result and plan of care. Written and verbal d/c instructions given and understanding voiced.

## 2018-03-13 ENCOUNTER — Encounter: Payer: Self-pay | Admitting: Obstetrics and Gynecology

## 2018-03-13 ENCOUNTER — Ambulatory Visit (INDEPENDENT_AMBULATORY_CARE_PROVIDER_SITE_OTHER): Payer: BLUE CROSS/BLUE SHIELD | Admitting: Obstetrics and Gynecology

## 2018-03-13 VITALS — BP 103/68 | HR 94 | Wt 97.0 lb

## 2018-03-13 DIAGNOSIS — Z3480 Encounter for supervision of other normal pregnancy, unspecified trimester: Secondary | ICD-10-CM

## 2018-03-13 NOTE — Progress Notes (Signed)
   PRENATAL VISIT NOTE  Subjective:  Danielle Bradley is a 22 y.o. G2P1001 at [redacted]w[redacted]d being seen today for ongoing prenatal care.  She is currently monitored for the following issues for this low-risk pregnancy and has Neurofibromatosis, type I (von Recklinghausen's disease) (HCC); Subchorionic hematoma; and Supervision of other normal pregnancy, antepartum on their problem list.  Patient reports no complaints.  Contractions: Not present. Vag. Bleeding: None.  Movement: Absent. Denies leaking of fluid.   The following portions of the patient's history were reviewed and updated as appropriate: allergies, current medications, past family history, past medical history, past social history, past surgical history and problem list. Problem list updated.  Objective:   Vitals:   03/13/18 0942  BP: 103/68  Pulse: 94  Weight: 97 lb (44 kg)    Fetal Status:     Movement: Absent     General:  Alert, oriented and cooperative. Patient is in no acute distress.  Skin: Skin is warm and dry. No rash noted.   Cardiovascular: Normal heart rate noted  Respiratory: Normal respiratory effort, no problems with respiration noted  Abdomen: Soft, gravid, appropriate for gestational age.  Pain/Pressure: Present     Pelvic: Cervical exam deferred        Extremities: Normal range of motion.  Edema: None  Mental Status: Normal mood and affect. Normal behavior. Normal judgment and thought content.   Assessment and Plan:  Pregnancy: G2P1001 at [redacted]w[redacted]d  1. Supervision of other normal pregnancy, antepartum Patient is doing well AFP today Anatomy ultrasound scheduled  Preterm labor symptoms and general obstetric precautions including but not limited to vaginal bleeding, contractions, leaking of fluid and fetal movement were reviewed in detail with the patient. Please refer to After Visit Summary for other counseling recommendations.  Return in about 4 weeks (around 04/10/2018) for ROB.  Future Appointments  Date  Time Provider Department Center  04/10/2018 10:15 AM [facility] [place] 4 [facility]    Catalina Antigua, MD

## 2018-03-15 LAB — AFP, SERUM, OPEN SPINA BIFIDA
AFP MOM: 0.58
AFP VALUE AFPOSL: 18.2 ng/mL
Gest. Age on Collection Date: 15.1 weeks
Maternal Age At EDD: 24.1 yr
OSBR RISK 1 IN: 10000
TEST RESULTS AFP: NEGATIVE
Weight: 159 [lb_av]

## 2018-04-03 ENCOUNTER — Encounter (HOSPITAL_COMMUNITY): Payer: Self-pay

## 2018-04-10 ENCOUNTER — Other Ambulatory Visit (HOSPITAL_COMMUNITY): Payer: BLUE CROSS/BLUE SHIELD

## 2018-04-10 ENCOUNTER — Encounter: Payer: BLUE CROSS/BLUE SHIELD | Admitting: Obstetrics and Gynecology

## 2018-04-12 ENCOUNTER — Ambulatory Visit (INDEPENDENT_AMBULATORY_CARE_PROVIDER_SITE_OTHER): Payer: BLUE CROSS/BLUE SHIELD | Admitting: Certified Nurse Midwife

## 2018-04-12 ENCOUNTER — Encounter: Payer: Self-pay | Admitting: Certified Nurse Midwife

## 2018-04-12 VITALS — BP 101/64 | HR 97 | Wt 100.2 lb

## 2018-04-12 DIAGNOSIS — Z3480 Encounter for supervision of other normal pregnancy, unspecified trimester: Secondary | ICD-10-CM

## 2018-04-12 DIAGNOSIS — Z3482 Encounter for supervision of other normal pregnancy, second trimester: Secondary | ICD-10-CM

## 2018-04-12 DIAGNOSIS — Z3A19 19 weeks gestation of pregnancy: Secondary | ICD-10-CM

## 2018-04-12 NOTE — Progress Notes (Signed)
   PRENATAL VISIT NOTE  Subjective:  Danielle Bradley is a 22 y.o. G2P1001 at 2664w3d being seen today for ongoing prenatal care.  She is currently monitored for the following issues for this low-risk pregnancy and has Neurofibromatosis, type I (von Recklinghausen's disease) (HCC); Subchorionic hematoma; and Supervision of other normal pregnancy, antepartum on their problem list.  Patient reports no complaints.  Contractions: Not present. Vag. Bleeding: None.  Movement: Present. Denies leaking of fluid.   The following portions of the patient's history were reviewed and updated as appropriate: allergies, current medications, past family history, past medical history, past social history, past surgical history and problem list. Problem list updated.  Objective:   Vitals:   04/12/18 1508  BP: 101/64  Pulse: 97  Weight: 100 lb 3.2 oz (45.5 kg)    Fetal Status: Fetal Heart Rate (bpm): 160   Movement: Present     General:  Alert, oriented and cooperative. Patient is in no acute distress.  Skin: Skin is warm and dry. No rash noted.   Cardiovascular: Normal heart rate noted  Respiratory: Normal respiratory effort, no problems with respiration noted  Abdomen: Soft, gravid, appropriate for gestational age.  Pain/Pressure: Absent     Pelvic: Cervical exam deferred        Extremities: Normal range of motion.  Edema: None  Mental Status: Normal mood and affect. Normal behavior. Normal judgment and thought content.   Assessment and Plan:  Pregnancy: G2P1001 at 5164w3d  1. Supervision of other normal pregnancy, antepartum - patient doing well, no complaints - Anticipatory guidance on upcoming appointments  - Anatomy US scheduled for 12/10 - Reviewed lab results from NO[facility] and genetic screening   Preterm labor symptoms and general obstetric precautions including but not limited to vaginal bleeding, contractions, leaking of fluid and fetal movement were reviewed in detail with the patient. Please  refer to After Visit Summary for other counseling recommendations.  Return in about 4 weeks (around 05/10/2018) for ROB.  Future Appointments  Date Time Provider Department Center  04/18/2018  1:45 PM [facility] [place] 2 [facility]  05/11/2018 10:15 AM Marny LowensteinWenzel, Julie N, PA-C [facility] None    Sharyon CableVeronica C Sayan Aldava, CNM

## 2018-04-12 NOTE — Patient Instructions (Signed)
Second Trimester of Pregnancy The second trimester is from week 13 through week 28, month 4 through 6. This is often the time in pregnancy that you feel your best. Often times, morning sickness has lessened or quit. You may have more energy, and you may get hungry more often. Your unborn baby (fetus) is growing rapidly. At the end of the sixth month, he or she is about 9 inches long and weighs about 1 pounds. You will likely feel the baby move (quickening) between 18 and 20 weeks of pregnancy. Follow these instructions at home:  Avoid all smoking, herbs, and alcohol. Avoid drugs not approved by your doctor.  Do not use any tobacco products, including cigarettes, chewing tobacco, and electronic cigarettes. If you need help quitting, ask your doctor. You may get counseling or other support to help you quit.  Only take medicine as told by your doctor. Some medicines are safe and some are not during pregnancy.  Exercise only as told by your doctor. Stop exercising if you start having cramps.  Eat regular, healthy meals.  Wear a good support bra if your breasts are tender.  Do not use hot tubs, steam rooms, or saunas.  Wear your seat belt when driving.  Avoid raw meat, uncooked cheese, and liter boxes and soil used by cats.  Take your prenatal vitamins.  Take 1500-2000 milligrams of calcium daily starting at the 20th week of pregnancy until you deliver your baby.  Try taking medicine that helps you poop (stool softener) as needed, and if your doctor approves. Eat more fiber by eating fresh fruit, vegetables, and whole grains. Drink enough fluids to keep your pee (urine) clear or pale yellow.  Take warm water baths (sitz baths) to soothe pain or discomfort caused by hemorrhoids. Use hemorrhoid cream if your doctor approves.  If you have puffy, bulging veins (varicose veins), wear support hose. Raise (elevate) your feet for 15 minutes, 3-4 times a day. Limit salt in your diet.  Avoid heavy  lifting, wear low heals, and sit up straight.  Rest with your legs raised if you have leg cramps or low back pain.  Visit your dentist if you have not gone during your pregnancy. Use a soft toothbrush to brush your teeth. Be gentle when you floss.  You can have sex (intercourse) unless your doctor tells you not to.  Go to your doctor visits. Get help if:  You feel dizzy.  You have mild cramps or pressure in your lower belly (abdomen).  You have a nagging pain in your belly area.  You continue to feel sick to your stomach (nauseous), throw up (vomit), or have watery poop (diarrhea).  You have bad smelling fluid coming from your vagina.  You have pain with peeing (urination). Get help right away if:  You have a fever.  You are leaking fluid from your vagina.  You have spotting or bleeding from your vagina.  You have severe belly cramping or pain.  You lose or gain weight rapidly.  You have trouble catching your breath and have chest pain.  You notice sudden or extreme puffiness (swelling) of your face, hands, ankles, feet, or legs.  You have not felt the baby move in over an hour.  You have severe headaches that do not go away with medicine.  You have vision changes. This information is not intended to replace advice given to you by your health care provider. Make sure you discuss any questions you have with your health care   provider. Document Released: 07/21/2009 Document Revised: 10/02/2015 Document Reviewed: 06/27/2012 Elsevier Interactive Patient Education  2017 [employer]  

## 2018-04-12 NOTE — Progress Notes (Signed)
Pt presents for ROB. 

## 2018-04-18 ENCOUNTER — Ambulatory Visit (HOSPITAL_COMMUNITY): Admission: RE | Admit: 2018-04-18 | Payer: BLUE CROSS/BLUE SHIELD | Source: Ambulatory Visit

## 2018-05-10 ENCOUNTER — Inpatient Hospital Stay (HOSPITAL_COMMUNITY)
Admission: AD | Admit: 2018-05-10 | Discharge: 2018-05-10 | Disposition: A | Discharge status: Home / Self Care | Payer: Medicaid Other | Source: Ambulatory Visit | Attending: Obstetrics and Gynecology | Admitting: Obstetrics and Gynecology

## 2018-05-10 NOTE — L&D Delivery Note (Addendum)
Delivery Note After AROM, cx reduced from 10cm to 9cm. She then became complete approx 10 mins before delivery- at 9:08 AM a viable female was delivered via Vaginal, Spontaneous (Presentation: ROA). Loose nuchal cord x 1 reduced prior to delivery. APGAR: 8, 9; weight 2940gm .  Cord clamped and cut by FOB after 1 min; hospital cord blood sample collected. Placenta status: spont, intact.  Cord: 3 vessel  Anesthesia:  Epidural; 1% lidocaine Episiotomy: None Lacerations: None;1st degree;Perineal (skin separation repaired with subcuticulars) Suture Repair: 3.0 monocryl Est. Blood Loss (mL): 650  Mom to postpartum.  Baby to Couplet care / Skin to Skin.  Arabella Merles CNM 08/23/2018, 9:41 AM  Please schedule this patient for Postpartum visit in: 4 weeks with the following provider: Any provider For C/S patients schedule nurse incision check in weeks 2 weeks: no Low risk pregnancy complicated by: none Delivery mode:  SVD Anticipated Birth Control:  Depo PP Procedures needed: none  Schedule Integrated BH visit: no

## 2018-05-11 ENCOUNTER — Encounter: Payer: Medicaid Other | Admitting: Medical

## 2018-05-15 ENCOUNTER — Ambulatory Visit (HOSPITAL_COMMUNITY)
Admission: RE | Admit: 2018-05-15 | Discharge: 2018-05-15 | Disposition: A | Discharge status: Home / Self Care | Payer: Medicaid Other | Source: Ambulatory Visit | Attending: Obstetrics and Gynecology | Admitting: Obstetrics and Gynecology

## 2018-05-15 DIAGNOSIS — Z3480 Encounter for supervision of other normal pregnancy, unspecified trimester: Secondary | ICD-10-CM | POA: Insufficient documentation

## 2018-05-15 DIAGNOSIS — Z363 Encounter for antenatal screening for malformations: Secondary | ICD-10-CM | POA: Diagnosis not present

## 2018-05-15 DIAGNOSIS — Z3A24 24 weeks gestation of pregnancy: Secondary | ICD-10-CM

## 2018-05-19 ENCOUNTER — Ambulatory Visit (INDEPENDENT_AMBULATORY_CARE_PROVIDER_SITE_OTHER): Payer: Medicaid Other | Admitting: Medical

## 2018-05-19 ENCOUNTER — Encounter: Payer: Self-pay | Admitting: Medical

## 2018-05-19 DIAGNOSIS — Z3A24 24 weeks gestation of pregnancy: Secondary | ICD-10-CM

## 2018-05-19 DIAGNOSIS — Z3480 Encounter for supervision of other normal pregnancy, unspecified trimester: Secondary | ICD-10-CM

## 2018-05-19 DIAGNOSIS — Z3482 Encounter for supervision of other normal pregnancy, second trimester: Secondary | ICD-10-CM

## 2018-05-19 NOTE — Progress Notes (Signed)
   PRENATAL VISIT NOTE  Subjective:  Danielle Bradley is a 23 y.o. G2P1001 at [redacted]w[redacted]d being seen today for ongoing prenatal care.  She is currently monitored for the following issues for this low-risk pregnancy and has Neurofibromatosis, type I (von Recklinghausen's disease) (HCC); Subchorionic hematoma; and Supervision of other normal pregnancy, antepartum on their problem list.  Patient reports no complaints.  Contractions: Not present. Vag. Bleeding: None.  Movement: Present. Denies leaking of fluid.   The following portions of the patient's history were reviewed and updated as appropriate: allergies, current medications, past family history, past medical history, past social history, past surgical history and problem list. Problem list updated.  Objective:   Vitals:   05/19/18 0954  BP: 105/69  Pulse: 90  Weight: 106 lb (48.1 kg)    Fetal Status: Fetal Heart Rate (bpm): 142 Fundal Height: 24 cm Movement: Present     General:  Alert, oriented and cooperative. Patient is in no acute distress.  Skin: Skin is warm and dry. No rash noted.   Cardiovascular: Normal heart rate noted  Respiratory: Normal respiratory effort, no problems with respiration noted  Abdomen: Soft, gravid, appropriate for gestational age.  Pain/Pressure: Absent     Pelvic: Cervical exam deferred        Extremities: Normal range of motion.  Edema: None  Mental Status: Normal mood and affect. Normal behavior. Normal judgment and thought content.   Assessment and Plan:  Pregnancy: G2P1001 at [redacted]w[redacted]d  1. Supervision of other normal pregnancy, antepartum - Doing well - Discussed normal anatomy US results today - Discussed need for 2 hour GTT, CBC, HIV and RPR at next visit. Patient advised to be fasting.   Preterm labor symptoms and general obstetric precautions including but not limited to vaginal bleeding, contractions, leaking of fluid and fetal movement were reviewed in detail with the patient. Please refer to  After Visit Summary for other counseling recommendations.  Return in about 4 weeks (around 06/16/2018) for LOB, 28 week labs (fasting).  Future Appointments  Date Time Provider Department Center  06/16/2018  8:30 AM Marny Lowenstein, PA-C [facility] None    Vonzella Nipple, PA-C

## 2018-05-19 NOTE — Patient Instructions (Signed)

## 2018-06-16 ENCOUNTER — Other Ambulatory Visit: Payer: Medicaid Other

## 2018-06-16 ENCOUNTER — Ambulatory Visit (INDEPENDENT_AMBULATORY_CARE_PROVIDER_SITE_OTHER): Payer: Medicaid Other | Admitting: Medical

## 2018-06-16 ENCOUNTER — Encounter: Payer: Self-pay | Admitting: Medical

## 2018-06-16 VITALS — BP 101/68 | HR 102 | Wt 108.0 lb

## 2018-06-16 DIAGNOSIS — Z3480 Encounter for supervision of other normal pregnancy, unspecified trimester: Secondary | ICD-10-CM | POA: Diagnosis not present

## 2018-06-16 DIAGNOSIS — Z23 Encounter for immunization: Secondary | ICD-10-CM | POA: Diagnosis not present

## 2018-06-16 DIAGNOSIS — Z3A28 28 weeks gestation of pregnancy: Secondary | ICD-10-CM

## 2018-06-16 DIAGNOSIS — Z3483 Encounter for supervision of other normal pregnancy, third trimester: Secondary | ICD-10-CM

## 2018-06-16 MED ORDER — TETANUS-DIPHTH-ACELL PERTUSSIS 5-2.5-18.5 LF-MCG/0.5 IM SUSP: 0.5000 mL | Freq: Once | INTRAMUSCULAR | Status: DC

## 2018-06-16 NOTE — Progress Notes (Signed)
   PRENATAL VISIT NOTE  Subjective:  Danielle Bradley is a 23 y.o. G2P1001 at [redacted]w[redacted]d being seen today for ongoing prenatal care.  She is currently monitored for the following issues for this low-risk pregnancy and has Neurofibromatosis, type I (von Recklinghausen's disease) (HCC); Subchorionic hematoma; and Supervision of other normal pregnancy, antepartum on their problem list.  Patient reports no complaints.  Contractions: Not present. Vag. Bleeding: None.  Movement: Present. Denies leaking of fluid.   The following portions of the patient's history were reviewed and updated as appropriate: allergies, current medications, past family history, past medical history, past social history, past surgical history and problem list. Problem list updated.  Objective:   Vitals:   06/16/18 0819  BP: 101/68  Pulse: (!) 102  Weight: 108 lb (49 kg)    Fetal Status: Fetal Heart Rate (bpm): 145 Fundal Height: 27 cm Movement: Present     General:  Alert, oriented and cooperative. Patient is in no acute distress.  Skin: Skin is warm and dry. No rash noted.   Cardiovascular: Normal heart rate noted  Respiratory: Normal respiratory effort, no problems with respiration noted  Abdomen: Soft, gravid, appropriate for gestational age.  Pain/Pressure: Absent     Pelvic: Cervical exam deferred        Extremities: Normal range of motion.  Edema: None  Mental Status: Normal mood and affect. Normal behavior. Normal judgment and thought content.   Assessment and Plan:  Pregnancy: G2P1001 at [redacted]w[redacted]d  1. Supervision of other normal pregnancy, antepartum - 2 hour GTT, CBC, HIV and RPR today  - Tdap vaccine greater than or equal to 7yo IM  Preterm labor symptoms and general obstetric precautions including but not limited to vaginal bleeding, contractions, leaking of fluid and fetal movement were reviewed in detail with the patient. Please refer to After Visit Summary for other counseling recommendations.  Return in  about 2 weeks (around 06/30/2018) for LOB.   Vonzella Nipple, PA-C

## 2018-06-16 NOTE — Patient Instructions (Signed)

## 2018-06-17 LAB — CBC
HEMATOCRIT: 30.9 % — AB (ref 34.0–46.6)
HEMOGLOBIN: 10 g/dL — AB (ref 11.1–15.9)
MCH: 27.9 pg (ref 26.6–33.0)
MCHC: 32.4 g/dL (ref 31.5–35.7)
MCV: 86 fL (ref 79–97)
Platelets: 205 10*3/uL (ref 150–450)
RBC: 3.58 x10E6/uL — AB (ref 3.77–5.28)
RDW: 14.2 % (ref 11.7–15.4)
WBC: 6.2 10*3/uL (ref 3.4–10.8)

## 2018-06-17 LAB — RPR: RPR Ser Ql: NONREACTIVE

## 2018-06-17 LAB — GLUCOSE TOLERANCE, 2 HOURS W/ 1HR
GLUCOSE, 1 HOUR: 103 mg/dL (ref 65–179)
GLUCOSE, 2 HOUR: 65 mg/dL (ref 65–152)
Glucose, Fasting: 73 mg/dL (ref 65–91)

## 2018-06-17 LAB — HIV ANTIBODY (ROUTINE TESTING W REFLEX): HIV SCREEN 4TH GENERATION: NONREACTIVE

## 2018-06-30 ENCOUNTER — Encounter: Payer: Self-pay | Admitting: Medical

## 2018-06-30 ENCOUNTER — Ambulatory Visit (INDEPENDENT_AMBULATORY_CARE_PROVIDER_SITE_OTHER): Payer: Medicaid Other | Admitting: Medical

## 2018-06-30 VITALS — BP 99/61 | HR 96 | Wt 110.0 lb

## 2018-06-30 DIAGNOSIS — Z3483 Encounter for supervision of other normal pregnancy, third trimester: Secondary | ICD-10-CM

## 2018-06-30 DIAGNOSIS — Z3A30 30 weeks gestation of pregnancy: Secondary | ICD-10-CM

## 2018-06-30 DIAGNOSIS — Z3480 Encounter for supervision of other normal pregnancy, unspecified trimester: Secondary | ICD-10-CM

## 2018-06-30 NOTE — Progress Notes (Signed)
   PRENATAL VISIT NOTE  Subjective:  Danielle Bradley is a 23 y.o. G2P1001 at [redacted]w[redacted]d being seen today for ongoing prenatal care.  She is currently monitored for the following issues for this low-risk pregnancy and has Neurofibromatosis, type I (von Recklinghausen's disease) (HCC); Subchorionic hematoma; and Supervision of other normal pregnancy, antepartum on their problem list.  Patient reports occasional contractions.  Contractions: Irregular. Vag. Bleeding: None.  Movement: Present. Denies leaking of fluid.   The following portions of the patient's history were reviewed and updated as appropriate: allergies, current medications, past family history, past medical history, past social history, past surgical history and problem list. Problem list updated.  Objective:   Vitals:   06/30/18 1110  BP: 99/61  Pulse: 96  Weight: 110 lb (49.9 kg)    Fetal Status: Fetal Heart Rate (bpm): 135 Fundal Height: 30 cm Movement: Present     General:  Alert, oriented and cooperative. Patient is in no acute distress.  Skin: Skin is warm and dry. No rash noted.   Cardiovascular: Normal heart rate noted  Respiratory: Normal respiratory effort, no problems with respiration noted  Abdomen: Soft, gravid, appropriate for gestational age.  Pain/Pressure: Absent     Pelvic: Cervical exam deferred        Extremities: Normal range of motion.  Edema: Trace  Mental Status: Normal mood and affect. Normal behavior. Normal judgment and thought content.   Assessment and Plan:  Pregnancy: G2P1001 at [redacted]w[redacted]d  1. Supervision of other normal pregnancy, antepartum - Doing well, BH contractions 2-3/hour at most, intermittent   Preterm labor symptoms and general obstetric precautions including but not limited to vaginal bleeding, contractions, leaking of fluid and fetal movement were reviewed in detail with the patient. Please refer to After Visit Summary for other counseling recommendations.  Return in about 2 weeks  (around 07/14/2018) for LOB.  Vonzella Nipple, PA-C

## 2018-06-30 NOTE — Patient Instructions (Signed)
Fetal Movement Counts Patient Name: ________________________________________________ Patient Due Date: ____________________ What is a fetal movement count?  A fetal movement count is the number of times that you feel your baby move during a certain amount of time. This may also be called a fetal kick count. A fetal movement count is recommended for every pregnant woman. You may be asked to start counting fetal movements as early as week 28 of your pregnancy. Pay attention to when your baby is most active. You may notice your baby's sleep and wake cycles. You may also notice things that make your baby move more. You should do a fetal movement count:  When your baby is normally most active.  At the same time each day. A good time to count movements is while you are resting, after having something to eat and drink. How do I count fetal movements? 1. Find a quiet, comfortable area. Sit, or lie down on your side. 2. Write down the date, the start time and stop time, and the number of movements that you felt between those two times. Take this information with you to your health care visits. 3. For 2 hours, count kicks, flutters, swishes, rolls, and jabs. You should feel at least 10 movements during 2 hours. 4. You may stop counting after you have felt 10 movements. 5. If you do not feel 10 movements in 2 hours, have something to eat and drink. Then, keep resting and counting for 1 hour. If you feel at least 4 movements during that hour, you may stop counting. Contact a health care provider if:  You feel fewer than 4 movements in 2 hours.  Your baby is not moving like he or she usually does. Date: ____________ Start time: ____________ Stop time: ____________ Movements: ____________ Date: ____________ Start time: ____________ Stop time: ____________ Movements: ____________ Date: ____________ Start time: ____________ Stop time: ____________ Movements: ____________ Date: ____________ Start time:  ____________ Stop time: ____________ Movements: ____________ Date: ____________ Start time: ____________ Stop time: ____________ Movements: ____________ Date: ____________ Start time: ____________ Stop time: ____________ Movements: ____________ Date: ____________ Start time: ____________ Stop time: ____________ Movements: ____________ Date: ____________ Start time: ____________ Stop time: ____________ Movements: ____________ Date: ____________ Start time: ____________ Stop time: ____________ Movements: ____________ This information is not intended to replace advice given to you by your health care provider. Make sure you discuss any questions you have with your health care provider. Document Released: 05/26/2006 Document Revised: 12/24/2015 Document Reviewed: 06/05/2015 Elsevier Interactive Patient Education  2019 [employer] Braxton Hicks Contractions Contractions of the uterus can occur throughout pregnancy, but they are not always a sign that you are in labor. You may have practice contractions called Braxton Hicks contractions. These false labor contractions are sometimes confused with true labor. What are Braxton Hicks contractions? Braxton Hicks contractions are tightening movements that occur in the muscles of the uterus before labor. Unlike true labor contractions, these contractions do not result in opening (dilation) and thinning of the cervix. Toward the end of pregnancy (32-34 weeks), Braxton Hicks contractions can happen more often and may become stronger. These contractions are sometimes difficult to tell apart from true labor because they can be very uncomfortable. You should not feel embarrassed if you go to the hospital with false labor. Sometimes, the only way to tell if you are in true labor is for your health care provider to look for changes in the cervix. The health care provider will do a physical exam and may monitor your contractions. If   you are not in true labor, the exam  should show that your cervix is not dilating and your water has not broken. If there are no other health problems associated with your pregnancy, it is completely safe for you to be sent home with false labor. You may continue to have Braxton Hicks contractions until you go into true labor. How to tell the difference between true labor and false labor True labor  Contractions last 30-70 seconds.  Contractions become very regular.  Discomfort is usually felt in the top of the uterus, and it spreads to the lower abdomen and low back.  Contractions do not go away with walking.  Contractions usually become more intense and increase in frequency.  The cervix dilates and gets thinner. False labor  Contractions are usually shorter and not as strong as true labor contractions.  Contractions are usually irregular.  Contractions are often felt in the front of the lower abdomen and in the groin.  Contractions may go away when you walk around or change positions while lying down.  Contractions get weaker and are shorter-lasting as time goes on.  The cervix usually does not dilate or become thin. Follow these instructions at home:   Take over-the-counter and prescription medicines only as told by your health care provider.  Keep up with your usual exercises and follow other instructions from your health care provider.  Eat and drink lightly if you think you are going into labor.  If Braxton Hicks contractions are making you uncomfortable: ? Change your position from lying down or resting to walking, or change from walking to resting. ? Sit and rest in a tub of warm water. ? Drink enough fluid to keep your urine pale yellow. Dehydration may cause these contractions. ? Do slow and deep breathing several times an hour.  Keep all follow-up prenatal visits as told by your health care provider. This is important. Contact a health care provider if:  You have a fever.  You have continuous  pain in your abdomen. Get help right away if:  Your contractions become stronger, more regular, and closer together.  You have fluid leaking or gushing from your vagina.  You pass blood-tinged mucus (bloody show).  You have bleeding from your vagina.  You have low back pain that you never had before.  You feel your baby's head pushing down and causing pelvic pressure.  Your baby is not moving inside you as much as it used to. Summary  Contractions that occur before labor are called Braxton Hicks contractions, false labor, or practice contractions.  Braxton Hicks contractions are usually shorter, weaker, farther apart, and less regular than true labor contractions. True labor contractions usually become progressively stronger and regular, and they become more frequent.  Manage discomfort from Braxton Hicks contractions by changing position, resting in a warm bath, drinking plenty of water, or practicing deep breathing. This information is not intended to replace advice given to you by your health care provider. Make sure you discuss any questions you have with your health care provider. Document Released: 09/09/2016 Document Revised: 02/08/2017 Document Reviewed: 09/09/2016 Elsevier Interactive Patient Education  2019 [employer]  

## 2018-06-30 NOTE — Progress Notes (Signed)
Having some Braxton Hicks contractions, no other complaints.

## 2018-07-05 ENCOUNTER — Telehealth: Payer: Self-pay | Admitting: Lactation Services

## 2018-07-05 NOTE — Telephone Encounter (Signed)
Fax received from [facility] on 1/16 @ 1800 for Postpartum Orthotic. Signed by Vonzella Nipple. PA and faxed back to [facility] as requested.

## 2018-07-14 ENCOUNTER — Other Ambulatory Visit: Payer: Self-pay

## 2018-07-14 ENCOUNTER — Ambulatory Visit (INDEPENDENT_AMBULATORY_CARE_PROVIDER_SITE_OTHER): Payer: Medicaid Other | Admitting: Medical

## 2018-07-14 ENCOUNTER — Encounter: Payer: Self-pay | Admitting: Medical

## 2018-07-14 VITALS — BP 111/69 | HR 95 | Wt 116.0 lb

## 2018-07-14 DIAGNOSIS — Z3480 Encounter for supervision of other normal pregnancy, unspecified trimester: Secondary | ICD-10-CM

## 2018-07-14 DIAGNOSIS — Z3A32 32 weeks gestation of pregnancy: Secondary | ICD-10-CM

## 2018-07-14 DIAGNOSIS — Z3483 Encounter for supervision of other normal pregnancy, third trimester: Secondary | ICD-10-CM

## 2018-07-14 NOTE — Patient Instructions (Signed)
Fetal Movement Counts Patient Name: ________________________________________________ Patient Due Date: ____________________ What is a fetal movement count?  A fetal movement count is the number of times that you feel your baby move during a certain amount of time. This may also be called a fetal kick count. A fetal movement count is recommended for every pregnant woman. You may be asked to start counting fetal movements as early as week 28 of your pregnancy. Pay attention to when your baby is most active. You may notice your baby's sleep and wake cycles. You may also notice things that make your baby move more. You should do a fetal movement count:  When your baby is normally most active.  At the same time each day. A good time to count movements is while you are resting, after having something to eat and drink. How do I count fetal movements? 1. Find a quiet, comfortable area. Sit, or lie down on your side. 2. Write down the date, the start time and stop time, and the number of movements that you felt between those two times. Take this information with you to your health care visits. 3. For 2 hours, count kicks, flutters, swishes, rolls, and jabs. You should feel at least 10 movements during 2 hours. 4. You may stop counting after you have felt 10 movements. 5. If you do not feel 10 movements in 2 hours, have something to eat and drink. Then, keep resting and counting for 1 hour. If you feel at least 4 movements during that hour, you may stop counting. Contact a health care provider if:  You feel fewer than 4 movements in 2 hours.  Your baby is not moving like he or she usually does. Date: ____________ Start time: ____________ Stop time: ____________ Movements: ____________ Date: ____________ Start time: ____________ Stop time: ____________ Movements: ____________ Date: ____________ Start time: ____________ Stop time: ____________ Movements: ____________ Date: ____________ Start time:  ____________ Stop time: ____________ Movements: ____________ Date: ____________ Start time: ____________ Stop time: ____________ Movements: ____________ Date: ____________ Start time: ____________ Stop time: ____________ Movements: ____________ Date: ____________ Start time: ____________ Stop time: ____________ Movements: ____________ Date: ____________ Start time: ____________ Stop time: ____________ Movements: ____________ Date: ____________ Start time: ____________ Stop time: ____________ Movements: ____________ This information is not intended to replace advice given to you by your health care provider. Make sure you discuss any questions you have with your health care provider. Document Released: 05/26/2006 Document Revised: 12/24/2015 Document Reviewed: 06/05/2015 Elsevier Interactive Patient Education  2019 [employer] Braxton Hicks Contractions Contractions of the uterus can occur throughout pregnancy, but they are not always a sign that you are in labor. You may have practice contractions called Braxton Hicks contractions. These false labor contractions are sometimes confused with true labor. What are Braxton Hicks contractions? Braxton Hicks contractions are tightening movements that occur in the muscles of the uterus before labor. Unlike true labor contractions, these contractions do not result in opening (dilation) and thinning of the cervix. Toward the end of pregnancy (32-34 weeks), Braxton Hicks contractions can happen more often and may become stronger. These contractions are sometimes difficult to tell apart from true labor because they can be very uncomfortable. You should not feel embarrassed if you go to the hospital with false labor. Sometimes, the only way to tell if you are in true labor is for your health care provider to look for changes in the cervix. The health care provider will do a physical exam and may monitor your contractions. If   you are not in true labor, the exam  should show that your cervix is not dilating and your water has not broken. If there are no other health problems associated with your pregnancy, it is completely safe for you to be sent home with false labor. You may continue to have Braxton Hicks contractions until you go into true labor. How to tell the difference between true labor and false labor True labor  Contractions last 30-70 seconds.  Contractions become very regular.  Discomfort is usually felt in the top of the uterus, and it spreads to the lower abdomen and low back.  Contractions do not go away with walking.  Contractions usually become more intense and increase in frequency.  The cervix dilates and gets thinner. False labor  Contractions are usually shorter and not as strong as true labor contractions.  Contractions are usually irregular.  Contractions are often felt in the front of the lower abdomen and in the groin.  Contractions may go away when you walk around or change positions while lying down.  Contractions get weaker and are shorter-lasting as time goes on.  The cervix usually does not dilate or become thin. Follow these instructions at home:   Take over-the-counter and prescription medicines only as told by your health care provider.  Keep up with your usual exercises and follow other instructions from your health care provider.  Eat and drink lightly if you think you are going into labor.  If Braxton Hicks contractions are making you uncomfortable: ? Change your position from lying down or resting to walking, or change from walking to resting. ? Sit and rest in a tub of warm water. ? Drink enough fluid to keep your urine pale yellow. Dehydration may cause these contractions. ? Do slow and deep breathing several times an hour.  Keep all follow-up prenatal visits as told by your health care provider. This is important. Contact a health care provider if:  You have a fever.  You have continuous  pain in your abdomen. Get help right away if:  Your contractions become stronger, more regular, and closer together.  You have fluid leaking or gushing from your vagina.  You pass blood-tinged mucus (bloody show).  You have bleeding from your vagina.  You have low back pain that you never had before.  You feel your baby's head pushing down and causing pelvic pressure.  Your baby is not moving inside you as much as it used to. Summary  Contractions that occur before labor are called Braxton Hicks contractions, false labor, or practice contractions.  Braxton Hicks contractions are usually shorter, weaker, farther apart, and less regular than true labor contractions. True labor contractions usually become progressively stronger and regular, and they become more frequent.  Manage discomfort from Braxton Hicks contractions by changing position, resting in a warm bath, drinking plenty of water, or practicing deep breathing. This information is not intended to replace advice given to you by your health care provider. Make sure you discuss any questions you have with your health care provider. Document Released: 09/09/2016 Document Revised: 02/08/2017 Document Reviewed: 09/09/2016 Elsevier Interactive Patient Education  2019 [employer]  

## 2018-07-14 NOTE — Progress Notes (Signed)
   PRENATAL VISIT NOTE  Subjective:  Danielle Bradley is a 23 y.o. G2P1001 at [redacted]w[redacted]d being seen today for ongoing prenatal care.  She is currently monitored for the following issues for this low-risk pregnancy and has Neurofibromatosis, type I (von Recklinghausen's disease) (HCC); Subchorionic hematoma; and Supervision of other normal pregnancy, antepartum on their problem list.  Patient reports no complaints.  Contractions: Irritability. Vag. Bleeding: None.  Movement: Present. Denies leaking of fluid.   The following portions of the patient's history were reviewed and updated as appropriate: allergies, current medications, past family history, past medical history, past social history, past surgical history and problem list. Problem list updated.  Objective:   Vitals:   07/14/18 1008  BP: 111/69  Pulse: 95  Weight: 116 lb (52.6 kg)    Fetal Status: Fetal Heart Rate (bpm): 137 Fundal Height: 32 cm Movement: Present     General:  Alert, oriented and cooperative. Patient is in no acute distress.  Skin: Skin is warm and dry. No rash noted.   Cardiovascular: Normal heart rate noted  Respiratory: Normal respiratory effort, no problems with respiration noted  Abdomen: Soft, gravid, appropriate for gestational age.  Pain/Pressure: Present     Pelvic: Cervical exam deferred        Extremities: Normal range of motion.  Edema: Trace  Mental Status: Normal mood and affect. Normal behavior. Normal judgment and thought content.   Assessment and Plan:  Pregnancy: G2P1001 at [redacted]w[redacted]d  1. Supervision of other normal pregnancy, antepartum - Doing well, no concerns   Preterm labor symptoms and general obstetric precautions including but not limited to vaginal bleeding, contractions, leaking of fluid and fetal movement were reviewed in detail with the patient. Please refer to After Visit Summary for other counseling recommendations.  Return in about 2 weeks (around 07/28/2018) for LOB.   Vonzella Nipple, PA-C

## 2018-07-18 ENCOUNTER — Ambulatory Visit: Payer: Medicaid Other

## 2018-07-18 ENCOUNTER — Other Ambulatory Visit (HOSPITAL_COMMUNITY)
Admission: RE | Admit: 2018-07-18 | Discharge: 2018-07-18 | Disposition: A | Discharge status: Home / Self Care | Payer: Medicaid Other | Source: Ambulatory Visit | Attending: Obstetrics | Admitting: Obstetrics

## 2018-07-18 VITALS — Wt 116.0 lb

## 2018-07-18 DIAGNOSIS — N898 Other specified noninflammatory disorders of vagina: Secondary | ICD-10-CM

## 2018-07-18 NOTE — Progress Notes (Signed)
SUBJECTIVE:  23 y.o. female complains of white vaginal discharge for 1 day. Denies abnormal vaginal bleeding or significant pelvic pain or fever. No UTI symptoms. Denies history of known exposure to STD.  Patient's last menstrual period was 11/27/2017 (exact date).  OBJECTIVE:  She appears well, afebrile.   ASSESSMENT:  Vaginal white Discharge with some itching. Patient is 24. 2 weeks and states baby is moving well. Denies any vaginal bleeding or leaking of fluid.   PLAN:   BVAG, CVAG probe sent to lab. Treatment: To be determined once lab results are received ROV prn if symptoms persist or worsen. Patient instructed that she can use Monistat if itching becomes worse.  Armandina Stammer RN

## 2018-07-19 ENCOUNTER — Other Ambulatory Visit: Payer: Self-pay | Admitting: Obstetrics & Gynecology

## 2018-07-19 DIAGNOSIS — Z3480 Encounter for supervision of other normal pregnancy, unspecified trimester: Secondary | ICD-10-CM

## 2018-07-19 LAB — CERVICOVAGINAL ANCILLARY ONLY
Bacterial vaginitis: POSITIVE — AB
Candida vaginitis: NEGATIVE

## 2018-07-19 MED ORDER — METRONIDAZOLE 500 MG PO TABS: 500.0000 mg | ORAL_TABLET | Freq: Two times a day (BID) | ORAL | 0 refills | Status: AC

## 2018-07-28 ENCOUNTER — Encounter: Payer: Self-pay | Admitting: Medical

## 2018-07-28 ENCOUNTER — Other Ambulatory Visit: Payer: Self-pay

## 2018-07-28 ENCOUNTER — Ambulatory Visit (INDEPENDENT_AMBULATORY_CARE_PROVIDER_SITE_OTHER): Payer: Medicaid Other | Admitting: Medical

## 2018-07-28 VITALS — BP 110/74 | HR 82 | Wt 115.0 lb

## 2018-07-28 DIAGNOSIS — Z3A34 34 weeks gestation of pregnancy: Secondary | ICD-10-CM

## 2018-07-28 DIAGNOSIS — Z3483 Encounter for supervision of other normal pregnancy, third trimester: Secondary | ICD-10-CM

## 2018-07-28 DIAGNOSIS — Z3480 Encounter for supervision of other normal pregnancy, unspecified trimester: Secondary | ICD-10-CM

## 2018-07-28 NOTE — Progress Notes (Signed)
   PRENATAL VISIT NOTE  Subjective:  Danielle Bradley is a 23 y.o. G2P1001 at [redacted]w[redacted]d being seen today for ongoing prenatal care.  She is currently monitored for the following issues for this low-risk pregnancy and has Neurofibromatosis, type I (von Recklinghausen's disease) (HCC); Subchorionic hematoma; and Supervision of other normal pregnancy, antepartum on their problem list.  Patient reports occasional contractions.  Contractions: Irregular. Vag. Bleeding: None.  Movement: Present. Denies leaking of fluid.   The following portions of the patient's history were reviewed and updated as appropriate: allergies, current medications, past family history, past medical history, past social history, past surgical history and problem list.   Objective:   Vitals:   07/28/18 1022  BP: 110/74  Pulse: 82  Weight: 115 lb (52.2 kg)    Fetal Status: Fetal Heart Rate (bpm): 140 Fundal Height: 34 cm Movement: Present     General:  Alert, oriented and cooperative. Patient is in no acute distress.  Skin: Skin is warm and dry. No rash noted.   Cardiovascular: Normal heart rate noted  Respiratory: Normal respiratory effort, no problems with respiration noted  Abdomen: Soft, gravid, appropriate for gestational age.  Pain/Pressure: Present     Pelvic: Cervical exam performed      closed, thick  Extremities: Normal range of motion.  Edema: None  Mental Status: Normal mood and affect. Normal behavior. Normal judgment and thought content.   Assessment and Plan:  Pregnancy: G2P1001 at [redacted]w[redacted]d 1. Supervision of other normal pregnancy, antepartum - Increased pressure with BH contractions while at work, few today  - Encouraged patient to call ahead if experiencing respiratory symptoms with fever so that she can be triaged and tested/directed appropriately    Preterm labor symptoms and general obstetric precautions including but not limited to vaginal bleeding, contractions, leaking of fluid and fetal movement  were reviewed in detail with the patient. Please refer to After Visit Summary for other counseling recommendations.   Return in about 2 weeks (around 08/11/2018) for LOB.  Future Appointments  Date Time Provider Department Center  08/11/2018 10:15 AM Marny Lowenstein, PA-C [facility] None    Vonzella Nipple, PA-C

## 2018-07-28 NOTE — Patient Instructions (Signed)
Fetal Movement Counts Patient Name: ________________________________________________ Patient Due Date: ____________________ What is a fetal movement count?  A fetal movement count is the number of times that you feel your baby move during a certain amount of time. This may also be called a fetal kick count. A fetal movement count is recommended for every pregnant woman. You may be asked to start counting fetal movements as early as week 28 of your pregnancy. Pay attention to when your baby is most active. You may notice your baby's sleep and wake cycles. You may also notice things that make your baby move more. You should do a fetal movement count:  When your baby is normally most active.  At the same time each day. A good time to count movements is while you are resting, after having something to eat and drink. How do I count fetal movements? 1. Find a quiet, comfortable area. Sit, or lie down on your side. 2. Write down the date, the start time and stop time, and the number of movements that you felt between those two times. Take this information with you to your health care visits. 3. For 2 hours, count kicks, flutters, swishes, rolls, and jabs. You should feel at least 10 movements during 2 hours. 4. You may stop counting after you have felt 10 movements. 5. If you do not feel 10 movements in 2 hours, have something to eat and drink. Then, keep resting and counting for 1 hour. If you feel at least 4 movements during that hour, you may stop counting. Contact a health care provider if:  You feel fewer than 4 movements in 2 hours.  Your baby is not moving like he or she usually does. Date: ____________ Start time: ____________ Stop time: ____________ Movements: ____________ Date: ____________ Start time: ____________ Stop time: ____________ Movements: ____________ Date: ____________ Start time: ____________ Stop time: ____________ Movements: ____________ Date: ____________ Start time:  ____________ Stop time: ____________ Movements: ____________ Date: ____________ Start time: ____________ Stop time: ____________ Movements: ____________ Date: ____________ Start time: ____________ Stop time: ____________ Movements: ____________ Date: ____________ Start time: ____________ Stop time: ____________ Movements: ____________ Date: ____________ Start time: ____________ Stop time: ____________ Movements: ____________ Date: ____________ Start time: ____________ Stop time: ____________ Movements: ____________ This information is not intended to replace advice given to you by your health care provider. Make sure you discuss any questions you have with your health care provider. Document Released: 05/26/2006 Document Revised: 12/24/2015 Document Reviewed: 06/05/2015 Elsevier Interactive Patient Education  2019 [employer] Braxton Hicks Contractions Contractions of the uterus can occur throughout pregnancy, but they are not always a sign that you are in labor. You may have practice contractions called Braxton Hicks contractions. These false labor contractions are sometimes confused with true labor. What are Braxton Hicks contractions? Braxton Hicks contractions are tightening movements that occur in the muscles of the uterus before labor. Unlike true labor contractions, these contractions do not result in opening (dilation) and thinning of the cervix. Toward the end of pregnancy (32-34 weeks), Braxton Hicks contractions can happen more often and may become stronger. These contractions are sometimes difficult to tell apart from true labor because they can be very uncomfortable. You should not feel embarrassed if you go to the hospital with false labor. Sometimes, the only way to tell if you are in true labor is for your health care provider to look for changes in the cervix. The health care provider will do a physical exam and may monitor your contractions. If   you are not in true labor, the exam  should show that your cervix is not dilating and your water has not broken. If there are no other health problems associated with your pregnancy, it is completely safe for you to be sent home with false labor. You may continue to have Braxton Hicks contractions until you go into true labor. How to tell the difference between true labor and false labor True labor  Contractions last 30-70 seconds.  Contractions become very regular.  Discomfort is usually felt in the top of the uterus, and it spreads to the lower abdomen and low back.  Contractions do not go away with walking.  Contractions usually become more intense and increase in frequency.  The cervix dilates and gets thinner. False labor  Contractions are usually shorter and not as strong as true labor contractions.  Contractions are usually irregular.  Contractions are often felt in the front of the lower abdomen and in the groin.  Contractions may go away when you walk around or change positions while lying down.  Contractions get weaker and are shorter-lasting as time goes on.  The cervix usually does not dilate or become thin. Follow these instructions at home:   Take over-the-counter and prescription medicines only as told by your health care provider.  Keep up with your usual exercises and follow other instructions from your health care provider.  Eat and drink lightly if you think you are going into labor.  If Braxton Hicks contractions are making you uncomfortable: ? Change your position from lying down or resting to walking, or change from walking to resting. ? Sit and rest in a tub of warm water. ? Drink enough fluid to keep your urine pale yellow. Dehydration may cause these contractions. ? Do slow and deep breathing several times an hour.  Keep all follow-up prenatal visits as told by your health care provider. This is important. Contact a health care provider if:  You have a fever.  You have continuous  pain in your abdomen. Get help right away if:  Your contractions become stronger, more regular, and closer together.  You have fluid leaking or gushing from your vagina.  You pass blood-tinged mucus (bloody show).  You have bleeding from your vagina.  You have low back pain that you never had before.  You feel your baby's head pushing down and causing pelvic pressure.  Your baby is not moving inside you as much as it used to. Summary  Contractions that occur before labor are called Braxton Hicks contractions, false labor, or practice contractions.  Braxton Hicks contractions are usually shorter, weaker, farther apart, and less regular than true labor contractions. True labor contractions usually become progressively stronger and regular, and they become more frequent.  Manage discomfort from Braxton Hicks contractions by changing position, resting in a warm bath, drinking plenty of water, or practicing deep breathing. This information is not intended to replace advice given to you by your health care provider. Make sure you discuss any questions you have with your health care provider. Document Released: 09/09/2016 Document Revised: 02/08/2017 Document Reviewed: 09/09/2016 Elsevier Interactive Patient Education  2019 [employer]  

## 2018-08-11 ENCOUNTER — Encounter: Payer: Self-pay | Admitting: Medical

## 2018-08-11 ENCOUNTER — Ambulatory Visit (INDEPENDENT_AMBULATORY_CARE_PROVIDER_SITE_OTHER): Payer: Medicaid Other | Admitting: Medical

## 2018-08-11 ENCOUNTER — Other Ambulatory Visit: Payer: Self-pay

## 2018-08-11 ENCOUNTER — Other Ambulatory Visit (HOSPITAL_COMMUNITY)
Admission: RE | Admit: 2018-08-11 | Discharge: 2018-08-11 | Disposition: A | Discharge status: Home / Self Care | Payer: Medicaid Other | Source: Ambulatory Visit | Attending: Medical | Admitting: Medical

## 2018-08-11 VITALS — BP 113/75 | HR 94 | Wt 117.9 lb

## 2018-08-11 DIAGNOSIS — Z3480 Encounter for supervision of other normal pregnancy, unspecified trimester: Secondary | ICD-10-CM | POA: Insufficient documentation

## 2018-08-11 DIAGNOSIS — Z3483 Encounter for supervision of other normal pregnancy, third trimester: Secondary | ICD-10-CM

## 2018-08-11 DIAGNOSIS — O99013 Anemia complicating pregnancy, third trimester: Secondary | ICD-10-CM

## 2018-08-11 DIAGNOSIS — Z3A36 36 weeks gestation of pregnancy: Secondary | ICD-10-CM

## 2018-08-11 NOTE — Progress Notes (Signed)
ROB/GBS.  Does not have access to a BP cuff.

## 2018-08-11 NOTE — Patient Instructions (Signed)
Fetal Movement Counts Patient Name: ________________________________________________ Patient Due Date: ____________________ What is a fetal movement count?  A fetal movement count is the number of times that you feel your baby move during a certain amount of time. This may also be called a fetal kick count. A fetal movement count is recommended for every pregnant woman. You may be asked to start counting fetal movements as early as week 28 of your pregnancy. Pay attention to when your baby is most active. You may notice your baby's sleep and wake cycles. You may also notice things that make your baby move more. You should do a fetal movement count:  When your baby is normally most active.  At the same time each day. A good time to count movements is while you are resting, after having something to eat and drink. How do I count fetal movements? 1. Find a quiet, comfortable area. Sit, or lie down on your side. 2. Write down the date, the start time and stop time, and the number of movements that you felt between those two times. Take this information with you to your health care visits. 3. For 2 hours, count kicks, flutters, swishes, rolls, and jabs. You should feel at least 10 movements during 2 hours. 4. You may stop counting after you have felt 10 movements. 5. If you do not feel 10 movements in 2 hours, have something to eat and drink. Then, keep resting and counting for 1 hour. If you feel at least 4 movements during that hour, you may stop counting. Contact a health care provider if:  You feel fewer than 4 movements in 2 hours.  Your baby is not moving like he or she usually does. Date: ____________ Start time: ____________ Stop time: ____________ Movements: ____________ Date: ____________ Start time: ____________ Stop time: ____________ Movements: ____________ Date: ____________ Start time: ____________ Stop time: ____________ Movements: ____________ Date: ____________ Start time:  ____________ Stop time: ____________ Movements: ____________ Date: ____________ Start time: ____________ Stop time: ____________ Movements: ____________ Date: ____________ Start time: ____________ Stop time: ____________ Movements: ____________ Date: ____________ Start time: ____________ Stop time: ____________ Movements: ____________ Date: ____________ Start time: ____________ Stop time: ____________ Movements: ____________ Date: ____________ Start time: ____________ Stop time: ____________ Movements: ____________ This information is not intended to replace advice given to you by your health care provider. Make sure you discuss any questions you have with your health care provider. Document Released: 05/26/2006 Document Revised: 12/24/2015 Document Reviewed: 06/05/2015 Elsevier Interactive Patient Education  2019 [employer] Braxton Hicks Contractions Contractions of the uterus can occur throughout pregnancy, but they are not always a sign that you are in labor. You may have practice contractions called Braxton Hicks contractions. These false labor contractions are sometimes confused with true labor. What are Braxton Hicks contractions? Braxton Hicks contractions are tightening movements that occur in the muscles of the uterus before labor. Unlike true labor contractions, these contractions do not result in opening (dilation) and thinning of the cervix. Toward the end of pregnancy (32-34 weeks), Braxton Hicks contractions can happen more often and may become stronger. These contractions are sometimes difficult to tell apart from true labor because they can be very uncomfortable. You should not feel embarrassed if you go to the hospital with false labor. Sometimes, the only way to tell if you are in true labor is for your health care provider to look for changes in the cervix. The health care provider will do a physical exam and may monitor your contractions. If   you are not in true labor, the exam  should show that your cervix is not dilating and your water has not broken. If there are no other health problems associated with your pregnancy, it is completely safe for you to be sent home with false labor. You may continue to have Braxton Hicks contractions until you go into true labor. How to tell the difference between true labor and false labor True labor  Contractions last 30-70 seconds.  Contractions become very regular.  Discomfort is usually felt in the top of the uterus, and it spreads to the lower abdomen and low back.  Contractions do not go away with walking.  Contractions usually become more intense and increase in frequency.  The cervix dilates and gets thinner. False labor  Contractions are usually shorter and not as strong as true labor contractions.  Contractions are usually irregular.  Contractions are often felt in the front of the lower abdomen and in the groin.  Contractions may go away when you walk around or change positions while lying down.  Contractions get weaker and are shorter-lasting as time goes on.  The cervix usually does not dilate or become thin. Follow these instructions at home:   Take over-the-counter and prescription medicines only as told by your health care provider.  Keep up with your usual exercises and follow other instructions from your health care provider.  Eat and drink lightly if you think you are going into labor.  If Braxton Hicks contractions are making you uncomfortable: ? Change your position from lying down or resting to walking, or change from walking to resting. ? Sit and rest in a tub of warm water. ? Drink enough fluid to keep your urine pale yellow. Dehydration may cause these contractions. ? Do slow and deep breathing several times an hour.  Keep all follow-up prenatal visits as told by your health care provider. This is important. Contact a health care provider if:  You have a fever.  You have continuous  pain in your abdomen. Get help right away if:  Your contractions become stronger, more regular, and closer together.  You have fluid leaking or gushing from your vagina.  You pass blood-tinged mucus (bloody show).  You have bleeding from your vagina.  You have low back pain that you never had before.  You feel your baby's head pushing down and causing pelvic pressure.  Your baby is not moving inside you as much as it used to. Summary  Contractions that occur before labor are called Braxton Hicks contractions, false labor, or practice contractions.  Braxton Hicks contractions are usually shorter, weaker, farther apart, and less regular than true labor contractions. True labor contractions usually become progressively stronger and regular, and they become more frequent.  Manage discomfort from Braxton Hicks contractions by changing position, resting in a warm bath, drinking plenty of water, or practicing deep breathing. This information is not intended to replace advice given to you by your health care provider. Make sure you discuss any questions you have with your health care provider. Document Released: 09/09/2016 Document Revised: 02/08/2017 Document Reviewed: 09/09/2016 Elsevier Interactive Patient Education  2019 [employer]  

## 2018-08-11 NOTE — Progress Notes (Signed)
   PRENATAL VISIT NOTE  Subjective:  Danielle Bradley is a 23 y.o. G2P1001 at [redacted]w[redacted]d being seen today for ongoing prenatal care.  She is currently monitored for the following issues for this low-risk pregnancy and has Neurofibromatosis, type I (von Recklinghausen's disease) (HCC); Subchorionic hematoma; and Supervision of other normal pregnancy, antepartum on their problem list.  Patient reports occasional contractions.  Contractions: Irritability. Vag. Bleeding: None.  Movement: Present. Denies leaking of fluid.   The following portions of the patient's history were reviewed and updated as appropriate: allergies, current medications, past family history, past medical history, past social history, past surgical history and problem list.   Objective:   Vitals:   08/11/18 1011  BP: 113/75  Pulse: 94  Weight: 117 lb 14.4 oz (53.5 kg)    Fetal Status: Fetal Heart Rate (bpm): 140 Fundal Height: 36 cm Movement: Present     General:  Alert, oriented and cooperative. Patient is in no acute distress.  Skin: Skin is warm and dry. No rash noted.   Cardiovascular: Normal heart rate noted  Respiratory: Normal respiratory effort, no problems with respiration noted  Abdomen: Soft, gravid, appropriate for gestational age.  Pain/Pressure: Present     Pelvic: Cervical exam performed Dilation: 1.5 Effacement (%): Thick Station: Ballotable  Extremities: Normal range of motion.  Edema: None  Mental Status: Normal mood and affect. Normal behavior. Normal judgment and thought content.   Assessment and Plan:  Pregnancy: G2P1001 at [redacted]w[redacted]d 1. Supervision of other normal pregnancy, antepartum - Cervicovaginal ancillary only( [place]) - Strep Gp B NAA - CBC - Enroll Patient in Babyscripts - Babyscripts Schedule Optimization - Encouraged patient to call ahead if experiencing respiratory symptoms with fever so that she can be triaged and directed appropriately if testing is warranted  Preterm labor  symptoms and general obstetric precautions including but not limited to vaginal bleeding, contractions, leaking of fluid and fetal movement were reviewed in detail with the patient. Please refer to After Visit Summary for other counseling recommendations.   Return in about 2 weeks (around 08/25/2018) for LOB.  No future appointments.  Vonzella Nipple, PA-C

## 2018-08-12 LAB — CBC
Hematocrit: 28.8 % — ABNORMAL LOW (ref 34.0–46.6)
Hemoglobin: 9.4 g/dL — ABNORMAL LOW (ref 11.1–15.9)
MCH: 27.2 pg (ref 26.6–33.0)
MCHC: 32.6 g/dL (ref 31.5–35.7)
MCV: 84 fL (ref 79–97)
Platelets: 216 10*3/uL (ref 150–450)
RBC: 3.45 x10E6/uL — ABNORMAL LOW (ref 3.77–5.28)
RDW: 13.1 % (ref 11.7–15.4)
WBC: 7.4 10*3/uL (ref 3.4–10.8)

## 2018-08-13 LAB — STREP GP B NAA: Strep Gp B NAA: NEGATIVE

## 2018-08-14 MED ORDER — FERROUS SULFATE 325 (65 FE) MG PO TABS: 325.0000 mg | ORAL_TABLET | Freq: Every day | ORAL | 2 refills | Status: DC

## 2018-08-14 NOTE — Addendum Note (Signed)
Addended by: Marny Lowenstein on: 08/14/2018 09:01 AM   Modules accepted: Orders

## 2018-08-15 ENCOUNTER — Encounter: Payer: Self-pay | Admitting: *Deleted

## 2018-08-15 LAB — CERVICOVAGINAL ANCILLARY ONLY
Chlamydia: NEGATIVE
Neisseria Gonorrhea: NEGATIVE

## 2018-08-16 ENCOUNTER — Inpatient Hospital Stay (HOSPITAL_COMMUNITY)
Admission: AD | Admit: 2018-08-16 | Discharge: 2018-08-16 | Disposition: A | Discharge status: Home / Self Care | Payer: Medicaid Other | Source: Ambulatory Visit | Attending: Family Medicine | Admitting: Family Medicine

## 2018-08-16 ENCOUNTER — Encounter (HOSPITAL_COMMUNITY): Payer: Self-pay | Admitting: *Deleted

## 2018-08-16 ENCOUNTER — Other Ambulatory Visit: Payer: Self-pay

## 2018-08-16 DIAGNOSIS — O471 False labor at or after 37 completed weeks of gestation: Secondary | ICD-10-CM

## 2018-08-16 DIAGNOSIS — Z3A37 37 weeks gestation of pregnancy: Secondary | ICD-10-CM | POA: Diagnosis not present

## 2018-08-16 NOTE — MAU Note (Signed)
PT SAYS  SHE CALLED HERE 2300- COULD FEEL PAINS  IN HER LOWER ABD AND BACK- [facility]- [facility]-  VE -  1-2 CM.   DENIES  HSV AND MRSA. GBS-   NEG

## 2018-08-16 NOTE — Discharge Instructions (Signed)
Braxton Hicks Contractions Contractions of the uterus can occur throughout pregnancy, but they are not always a sign that you are in labor. You may have practice contractions called Braxton Hicks contractions. These false labor contractions are sometimes confused with true labor. What are Braxton Hicks contractions? Braxton Hicks contractions are tightening movements that occur in the muscles of the uterus before labor. Unlike true labor contractions, these contractions do not result in opening (dilation) and thinning of the cervix. Toward the end of pregnancy (32-34 weeks), Braxton Hicks contractions can happen more often and may become stronger. These contractions are sometimes difficult to tell apart from true labor because they can be very uncomfortable. You should not feel embarrassed if you go to the hospital with false labor. Sometimes, the only way to tell if you are in true labor is for your health care provider to look for changes in the cervix. The health care provider will do a physical exam and may monitor your contractions. If you are not in true labor, the exam should show that your cervix is not dilating and your water has not broken. If there are no other health problems associated with your pregnancy, it is completely safe for you to be sent home with false labor. You may continue to have Braxton Hicks contractions until you go into true labor. How to tell the difference between true labor and false labor True labor  Contractions last 30-70 seconds.  Contractions become very regular.  Discomfort is usually felt in the top of the uterus, and it spreads to the lower abdomen and low back.  Contractions do not go away with walking.  Contractions usually become more intense and increase in frequency.  The cervix dilates and gets thinner. False labor  Contractions are usually shorter and not as strong as true labor contractions.  Contractions are usually irregular.  Contractions  are often felt in the front of the lower abdomen and in the groin.  Contractions may go away when you walk around or change positions while lying down.  Contractions get weaker and are shorter-lasting as time goes on.  The cervix usually does not dilate or become thin. Follow these instructions at home:   Take over-the-counter and prescription medicines only as told by your health care provider.  Keep up with your usual exercises and follow other instructions from your health care provider.  Eat and drink lightly if you think you are going into labor.  If Braxton Hicks contractions are making you uncomfortable: ? Change your position from lying down or resting to walking, or change from walking to resting. ? Sit and rest in a tub of warm water. ? Drink enough fluid to keep your urine pale yellow. Dehydration may cause these contractions. ? Do slow and deep breathing several times an hour.  Keep all follow-up prenatal visits as told by your health care provider. This is important. Contact a health care provider if:  You have a fever.  You have continuous pain in your abdomen. Get help right away if:  Your contractions become stronger, more regular, and closer together.  You have fluid leaking or gushing from your vagina.  You pass blood-tinged mucus (bloody show).  You have bleeding from your vagina.  You have low back pain that you never had before.  You feel your baby's head pushing down and causing pelvic pressure.  Your baby is not moving inside you as much as it used to. Summary  Contractions that occur before labor are   called Braxton Hicks contractions, false labor, or practice contractions.  Braxton Hicks contractions are usually shorter, weaker, farther apart, and less regular than true labor contractions. True labor contractions usually become progressively stronger and regular, and they become more frequent.  Manage discomfort from Braxton Hicks contractions  by changing position, resting in a warm bath, drinking plenty of water, or practicing deep breathing. This information is not intended to replace advice given to you by your health care provider. Make sure you discuss any questions you have with your health care provider. Document Released: 09/09/2016 Document Revised: 02/08/2017 Document Reviewed: 09/09/2016 Elsevier Interactive Patient Education  2019 [employer] Inc[employer]  

## 2018-08-16 NOTE — MAU Note (Signed)
I have communicated with Dr. Aneta Mins and reviewed vital signs:  Vitals:   08/16/18 0037 08/16/18 0053  BP: 113/73 120/75  Pulse: 77 77  Resp: 16   Temp: 98.5 F (36.9 C)     Vaginal exam:  Dilation: 1.5 Effacement (%): 30 Station: -3 Presentation: Vertex Exam by:: Latricia Heft, RN,   Also reviewed contraction pattern and that non-stress test is reactive.  It has been documented that patient is contracting every 4-12 minutes with no cervical change over 1 hour not indicating active labor.  Patient denies any other complaints.  Based on this report provider has given order for discharge.  A discharge order and diagnosis entered by a provider.   Labor discharge instructions reviewed with patient.

## 2018-08-21 ENCOUNTER — Inpatient Hospital Stay (HOSPITAL_COMMUNITY)
Admission: AD | Admit: 2018-08-21 | Discharge: 2018-08-21 | Disposition: A | Discharge status: Home / Self Care | Payer: Medicaid Other | Attending: Obstetrics & Gynecology | Admitting: Obstetrics & Gynecology

## 2018-08-21 ENCOUNTER — Other Ambulatory Visit: Payer: Self-pay

## 2018-08-21 ENCOUNTER — Encounter (HOSPITAL_COMMUNITY): Payer: Self-pay

## 2018-08-21 DIAGNOSIS — Z3A38 38 weeks gestation of pregnancy: Secondary | ICD-10-CM

## 2018-08-21 DIAGNOSIS — O471 False labor at or after 37 completed weeks of gestation: Secondary | ICD-10-CM

## 2018-08-21 NOTE — Discharge Instructions (Signed)
Safe Medications in Pregnancy  ° °Acne: °Benzoyl Peroxide °Salicylic Acid ° °Backache/Headache: °Tylenol: 2 regular strength every 4 hours OR °             2 Extra strength every 6 hours ° °Colds/Coughs/Allergies: °Benadryl (alcohol free) 25 mg every 6 hours as needed °Breath right strips °Claritin °Cepacol throat lozenges °Chloraseptic throat spray °Cold-Eeze- up to three times per day °Cough drops, alcohol free °Flonase (by prescription only) °Guaifenesin °Mucinex °Robitussin DM (plain only, alcohol free) °Saline nasal spray/drops °Sudafed (pseudoephedrine) & Actifed ** use only after [redacted] weeks gestation and if you do not have high blood pressure °Tylenol °Vicks Vaporub °Zinc lozenges °Zyrtec  ° °Constipation: °Colace °Ducolax suppositories °Fleet enema °Glycerin suppositories °Metamucil °Milk of magnesia °Miralax °Senokot °Smooth move tea ° °Diarrhea: °Kaopectate °Imodium A-D ° °*NO pepto Bismol ° °Hemorrhoids: °Anusol °Anusol HC °Preparation H °Tucks ° °Indigestion: °Tums °Maalox °Mylanta °Zantac  °Pepcid ° °Insomnia: °Benadryl (alcohol free) 25mg every 6 hours as needed °Tylenol PM °Unisom, no Gelcaps ° °Leg Cramps: °Tums °MagGel ° °Nausea/Vomiting:  °Bonine °Dramamine °Emetrol °Ginger extract °Sea bands °Meclizine  °Nausea medication to take during pregnancy:  °Unisom (doxylamine succinate 25 mg tablets) Take one tablet daily at bedtime. If symptoms are not adequately controlled, the dose can be increased to a maximum recommended dose of two tablets daily (1/2 tablet in the morning, 1/2 tablet mid-afternoon and one at bedtime). °Vitamin B6 100mg tablets. Take one tablet twice a day (up to 200 mg per day). ° °Skin Rashes: °Aveeno products °Benadryl cream or 25mg every 6 hours as needed °Calamine Lotion °1% cortisone cream ° °Yeast infection: °Gyne-lotrimin 7 °Monistat 7 ° ° °**If taking multiple medications, please check labels to avoid duplicating the same active ingredients °**take medication as directed on  the label °** Do not exceed 4000 mg of tylenol in 24 hours °**Do not take medications that contain aspirin or ibuprofen ° ° ° °Social Distancing FAQ: How It Helps Prevent COVID-19 (Coronavirus) and Steps We Can Take to Protect Ourselves °There are many things we can do to prevent the spread of COVID-19 (coronavirus): washing our hands, coughing into our elbows, avoiding touching our faces, staying home if we're feeling sick and social distancing. But what is social distancing? These frequently asked questions explain how to practice social distancing in order to protect yourself, your loved ones and your community. °General Information About Social Distancing °Q: What is social distancing? °A: Social distancing is the practice of purposefully reducing close contact between people. According to the CDC, social distancing means: °• Remaining out of “congregate settings” as much as possible. °• Avoiding mass gatherings. °• Maintaining distance of about 6 feet from others when possible. °Q: Why is social distancing important? °A: Social distancing is crucial for preventing the spread of contagious illnesses such as COVID-19 (coronavirus). COVID-19 can spread through coughing, sneezing and close contact. By minimizing the amount of close contact we have with others, we reduce our chances of catching the virus and spreading it to our loved ones and within our community. °Q: Who is social distancing important for? °A: Social distancing is important for all of us, but those of us who are at higher risk of serious complications caused by COVID-19 should be especially cautious about social distancing. People who are at high risk of complications include: °• Older adults. °• People who have serious chronic medical conditions like heart disease, diabetes and lung disease. °Q: What is “flattening the curve”? What does it have to   do with social distancing? °A: “Flattening the curve” refers to reducing the number of people who are  sick at one time. If there are high surges in the number of COVID-19 cases all at once, health care systems and resources could potentially become overwhelmed. Efforts that help stop COVID-19 from spreading rapidly - like social distancing - help keep the number of people who are sick at one time as low as possible. °Q: When should I start practicing social distancing? °A: The best time to begin social distancing is before an illness like COVID-19 becomes widespread throughout your community. Each community's situation is unique, so it is important to follow the guidance of local government, health departments and health care providers. Currently in [place], gatherings of more than 10 people are banned. For the latest information on COVID-19 policies in [place], visit the [place] Department of Health and Human Services website and view news releases and executive orders. °The advice below is applicable if you are symptom-free and have reasonable confidence that you have not had exposure to COVID-19. Always follow the guidance of local government, health departments and your health care providers. °Visiting Public Spaces °Q: How can I practice social distancing in the workplace? °A: When possible, keeping about 6 feet of distance between yourself and others is key. It's also important to practice other preventative measures such as washing hands, avoiding touching your face, coughing into your elbow and staying home if you feel sick. Depending on your job and your community's situation, working from home may be an option. Always follow local guidance. °Q: How can my child practice social distancing at school or at college?  °A: Many schools and universities in the [place] have postponed in-person classes; currently in [place], K-12 schools have been closed until May 15 (this is subject to change). It's important to follow local guidance, as each community's situation is unique. It's important  for people of all ages to follow preventative measures, including staying about 6 feet away from others, avoiding touching your face, washing your hands and coughing into your elbow. °Q: Should I be concerned about going to the grocery store? °A: In any place where large numbers of people gather, there is potential risk for disease transmission. When you visit the grocery store, keep about 6 feet between yourself and others and use prevention techniques like avoiding touching your face and washing your hands. If possible, visit the store at times when there are likely to be fewer people shopping. °Q: Should I take public transportation? °A: If you have the option, driving yourself, walking to work or working from home can help reduce the number of people who are using public transportation, which benefits you and your community. In any of these situations, it's very important to keep distance between yourself and others in addition to practicing other preventative measures. °Q: Should I stop visiting restaurants and bars? °A: Currently in [place], restaurants and bars have been closed until further notice. Always follow local guidance. Avoiding public places as much as possible helps prevent diseases from spreading. If dining out is a non-essential activity, then it is generally in the best interest of you and your loved ones to avoid it. °Q: Can I still go to the gym? °A: Currently in [place], gyms have been closed until further notice. Always follow local guidance. Alternatives could include exercising in your home or yard or walking through your neighborhood. If you do go to the gym, wipe down   and sanitize your equipment, keep distance between yourself and others, avoid touching other people and practice other preventative techniques.  °Q: What about events and places where many people gather, such as concerts, festivals, sporting events and churches? °A: Risk of disease transmission is much higher  in large groups of people. Effective March 25 at 5 PM in [place], meetings of over 10 people are prohibited in order to prevent COVID-19 from spreading rapidly. Avoiding these gatherings are generally in the best interest of protecting yourself, your loved ones and your community. Always follow local guidance. °Meeting with Others °Q: Should I stop visiting my elderly relatives and friends?  °A: Older adults are at high risk of serious complications from COVID-19. Limiting their exposure as much as possible to those who may be sick or who may be carrying the disease is crucial. This is a great opportunity to try other methods of connecting, such as over the phone or through a video chat. °Q: What about social distancing with other people in my household? °A: Avoiding close contact within a household is almost impossible, and social distancing is mainly focused on large groups. However, if someone in your household is sick, it's important to minimize close contact with them as much as is reasonable. °Q: Should I stop meeting up with 1 or 2 friends? Should I stop dating? °A: While meeting up with another person who also is symptom-free may be alright in some situations, keep in mind that risk of disease transmission is higher in public places. Now may be a good time to consider other methods of connecting with others, such as over the phone or through a video chat. °Q: Can I have a small group of my extended family and/or friends over to my house? °A: If possible, it's best to postpone these kinds of gatherings and look for alternative ways to connect. Avoiding non-essential gatherings is important for preventing the spread of disease. °Q: Should my family and friends cancel big gathering events like weddings and birthday parties? °A: Effective March 25 at 5 PM in [place], meetings of over 10 people are prohibited in order to prevent COVID-19 from spreading rapidly. Always follow local guidance. While  it's difficult to postpone important events like these, it's also very important to protect our loved ones - especially our loved ones who are most vulnerable. It may be best to postpone or alter your plans.  °Q: If I'm avoiding in-person gatherings, how can I stay connected to others? °A: There are many ways you can connect with friends: phone calls, text messages, emails and video chats are all great virtual options. While physical social distancing is important for our health, so is social interaction - trying alternative ways to stay connected is a good way to take care of your emotional health. °If You Are Experiencing Symptoms °Q: How should I approach social distancing if I start to feel sick? °A: If you begin to experience symptoms, it's important to stay home and distance yourself from others. Always follow the guidance of your health care providers and local government. °Q: Can I have visitors while I am in quarantine? °A: Having visitors should be avoided as much as possible. Quarantining is an important way to protect your loved ones and community from picking up the disease; visitors are at high risk of catching the illness from you. °Q: Can I go outside in my yard if I am in quarantine? °A: Depending on where you live and your community's   situation, going outside and getting some fresh air in your yard may be safe. Always follow the guidance of your health care providers and local government. ° ° °

## 2018-08-21 NOTE — MAU Note (Signed)
Patient presents to MAU c/o of ctx q2-4 minutes. No bleeding, no leaking, +FM.

## 2018-08-21 NOTE — MAU Provider Note (Signed)
S: Danielle Bradley is a 23 y.o. G2P1001 at [redacted]w[redacted]d  who presents to MAU today complaining contractions q 5 minutes since 2200. She denies vaginal bleeding. She denies LOF. She reports normal fetal movement.    O: BP 124/85 (BP Location: Right Arm)   Pulse 91   Temp 98.2 F (36.8 C) (Oral)   Resp 17   Ht 5' (1.524 m)   Wt 53.9 kg   LMP 11/27/2017 (Exact Date)   BMI 23.22 kg/m  GENERAL: Well-developed, well-nourished female in no acute distress.  HEAD: Normocephalic, atraumatic.  CHEST: Normal effort of breathing, regular heart rate ABDOMEN: Soft, nontender, gravid  Cervical exam:  Dilation: 2 Effacement (%): 50 Station: -3, Ballotable Presentation: Vertex Exam by:: Lauren Cox, RN   Fetal Monitoring: Baseline: 120 Variability: moderate Accelerations: 15x15 Decelerations: none Contractions: 2-7   A: SIUP at [redacted]w[redacted]d  False labor  P: -Discharge home in stable condition -Labor precautions discussed -Patient advised to follow-up with [facility] as scheduled for prenatal care -Patient may return to MAU as needed or if her condition were to change or worsen   Rolm Bookbinder, [place] 08/21/2018 2:18 AM

## 2018-08-22 ENCOUNTER — Other Ambulatory Visit: Payer: Self-pay | Admitting: Medical

## 2018-08-22 MED ORDER — BLOOD PRESSURE MONITOR KIT: 1.0000 | PACK | 0 refills | Status: DC

## 2018-08-23 ENCOUNTER — Other Ambulatory Visit: Payer: Self-pay

## 2018-08-23 ENCOUNTER — Inpatient Hospital Stay (HOSPITAL_COMMUNITY): Payer: Medicaid Other | Admitting: Anesthesiology

## 2018-08-23 ENCOUNTER — Encounter (HOSPITAL_COMMUNITY): Payer: Self-pay

## 2018-08-23 ENCOUNTER — Inpatient Hospital Stay (HOSPITAL_COMMUNITY)
Admission: AD | Admit: 2018-08-23 | Discharge: 2018-08-24 | DRG: 807 | Disposition: A | Discharge status: Home / Self Care | Payer: Medicaid Other | Attending: Obstetrics and Gynecology | Admitting: Obstetrics and Gynecology

## 2018-08-23 DIAGNOSIS — O26893 Other specified pregnancy related conditions, third trimester: Secondary | ICD-10-CM | POA: Diagnosis present

## 2018-08-23 DIAGNOSIS — Z3A38 38 weeks gestation of pregnancy: Secondary | ICD-10-CM

## 2018-08-23 DIAGNOSIS — O352XX0 Maternal care for (suspected) hereditary disease in fetus, not applicable or unspecified: Secondary | ICD-10-CM | POA: Diagnosis not present

## 2018-08-23 DIAGNOSIS — Q8501 Neurofibromatosis, type 1: Secondary | ICD-10-CM

## 2018-08-23 LAB — TYPE AND SCREEN
ABO/RH(D): O POS
Antibody Screen: NEGATIVE

## 2018-08-23 LAB — CBC
HCT: 32.7 % — ABNORMAL LOW (ref 36.0–46.0)
Hemoglobin: 9.9 g/dL — ABNORMAL LOW (ref 12.0–15.0)
MCH: 25.7 pg — ABNORMAL LOW (ref 26.0–34.0)
MCHC: 30.3 g/dL (ref 30.0–36.0)
MCV: 84.9 fL (ref 80.0–100.0)
Platelets: 205 10*3/uL (ref 150–400)
RBC: 3.85 MIL/uL — ABNORMAL LOW (ref 3.87–5.11)
RDW: 13.5 % (ref 11.5–15.5)
WBC: 10.9 10*3/uL — ABNORMAL HIGH (ref 4.0–10.5)
nRBC: 0 % (ref 0.0–0.2)

## 2018-08-23 LAB — ABO/RH: ABO/RH(D): O POS

## 2018-08-23 LAB — POCT FERN TEST: POCT Fern Test: NEGATIVE

## 2018-08-23 MED ORDER — OXYTOCIN BOLUS FROM INFUSION
500.0000 mL | Freq: Once | INTRAVENOUS | Status: AC
  Administered 2018-08-23: 500 mL via INTRAVENOUS

## 2018-08-23 MED ORDER — TETANUS-DIPHTH-ACELL PERTUSSIS 5-2.5-18.5 LF-MCG/0.5 IM SUSP: 0.5000 mL | Freq: Once | INTRAMUSCULAR | Status: DC

## 2018-08-23 MED ORDER — SODIUM CHLORIDE (PF) 0.9 % IJ SOLN
INTRAMUSCULAR | Status: DC | PRN
  Administered 2018-08-23: 12 mL/h via EPIDURAL

## 2018-08-23 MED ORDER — ZOLPIDEM TARTRATE 5 MG PO TABS: 5.0000 mg | ORAL_TABLET | Freq: Every evening | ORAL | Status: DC | PRN

## 2018-08-23 MED ORDER — PRENATAL MULTIVITAMIN CH
1.0000 | ORAL_TABLET | Freq: Every day | ORAL | Status: DC
  Administered 2018-08-23: 1 via ORAL
  Filled 2018-08-23: qty 1

## 2018-08-23 MED ORDER — WITCH HAZEL-GLYCERIN EX PADS: 1.0000 "application " | MEDICATED_PAD | CUTANEOUS | Status: DC | PRN

## 2018-08-23 MED ORDER — LACTATED RINGERS IV SOLN: INTRAVENOUS | Status: DC

## 2018-08-23 MED ORDER — OXYCODONE HCL 5 MG PO TABS: 5.0000 mg | ORAL_TABLET | ORAL | Status: DC | PRN

## 2018-08-23 MED ORDER — ONDANSETRON HCL 4 MG PO TABS: 4.0000 mg | ORAL_TABLET | ORAL | Status: DC | PRN

## 2018-08-23 MED ORDER — NALBUPHINE HCL 10 MG/ML IJ SOLN
5.0000 mg | Freq: Once | INTRAMUSCULAR | Status: AC
  Administered 2018-08-23: 5 mg via INTRAMUSCULAR
  Filled 2018-08-23: qty 1

## 2018-08-23 MED ORDER — DIBUCAINE (PERIANAL) 1 % EX OINT: 1.0000 "application " | TOPICAL_OINTMENT | CUTANEOUS | Status: DC | PRN

## 2018-08-23 MED ORDER — ONDANSETRON HCL 4 MG/2ML IJ SOLN: 4.0000 mg | Freq: Four times a day (QID) | INTRAMUSCULAR | Status: DC | PRN

## 2018-08-23 MED ORDER — LIDOCAINE-EPINEPHRINE (PF) 2 %-1:200000 IJ SOLN
INTRAMUSCULAR | Status: DC | PRN
  Administered 2018-08-23: 3 mL via EPIDURAL
  Administered 2018-08-23: 2 mL via EPIDURAL

## 2018-08-23 MED ORDER — OXYTOCIN 40 UNITS IN NORMAL SALINE INFUSION - SIMPLE MED
2.5000 [IU]/h | INTRAVENOUS | Status: DC
  Filled 2018-08-23: qty 1000

## 2018-08-23 MED ORDER — LIDOCAINE HCL (PF) 1 % IJ SOLN
30.0000 mL | INTRAMUSCULAR | Status: AC | PRN
  Administered 2018-08-23: 30 mL via SUBCUTANEOUS

## 2018-08-23 MED ORDER — ACETAMINOPHEN 325 MG PO TABS: 650.0000 mg | ORAL_TABLET | ORAL | Status: DC | PRN

## 2018-08-23 MED ORDER — IBUPROFEN 600 MG PO TABS
600.0000 mg | ORAL_TABLET | Freq: Four times a day (QID) | ORAL | Status: DC
  Administered 2018-08-23 – 2018-08-24 (×4): 600 mg via ORAL
  Filled 2018-08-23 (×4): qty 1

## 2018-08-23 MED ORDER — FENTANYL-BUPIVACAINE-NACL 0.5-0.125-0.9 MG/250ML-% EP SOLN
12.0000 mL/h | EPIDURAL | Status: DC | PRN
  Filled 2018-08-23: qty 250

## 2018-08-23 MED ORDER — FLEET ENEMA 7-19 GM/118ML RE ENEM: 1.0000 | ENEMA | RECTAL | Status: DC | PRN

## 2018-08-23 MED ORDER — SOD CITRATE-CITRIC ACID 500-334 MG/5ML PO SOLN: 30.0000 mL | ORAL | Status: DC | PRN

## 2018-08-23 MED ORDER — LACTATED RINGERS IV SOLN: 500.0000 mL | Freq: Once | INTRAVENOUS | Status: DC

## 2018-08-23 MED ORDER — PHENYLEPHRINE 40 MCG/ML (10ML) SYRINGE FOR IV PUSH (FOR BLOOD PRESSURE SUPPORT): 80.0000 ug | PREFILLED_SYRINGE | INTRAVENOUS | Status: DC | PRN

## 2018-08-23 MED ORDER — EPHEDRINE 5 MG/ML INJ: 10.0000 mg | INTRAVENOUS | Status: DC | PRN

## 2018-08-23 MED ORDER — SENNOSIDES-DOCUSATE SODIUM 8.6-50 MG PO TABS
2.0000 | ORAL_TABLET | ORAL | Status: DC
  Administered 2018-08-23: 2 via ORAL
  Filled 2018-08-23: qty 2

## 2018-08-23 MED ORDER — DIPHENHYDRAMINE HCL 50 MG/ML IJ SOLN: 12.5000 mg | INTRAMUSCULAR | Status: DC | PRN

## 2018-08-23 MED ORDER — ONDANSETRON HCL 4 MG/2ML IJ SOLN: 4.0000 mg | INTRAMUSCULAR | Status: DC | PRN

## 2018-08-23 MED ORDER — PHENYLEPHRINE 40 MCG/ML (10ML) SYRINGE FOR IV PUSH (FOR BLOOD PRESSURE SUPPORT)
80.0000 ug | PREFILLED_SYRINGE | INTRAVENOUS | Status: DC | PRN
  Filled 2018-08-23: qty 10

## 2018-08-23 MED ORDER — BENZOCAINE-MENTHOL 20-0.5 % EX AERO: 1.0000 "application " | INHALATION_SPRAY | CUTANEOUS | Status: DC | PRN

## 2018-08-23 MED ORDER — LACTATED RINGERS IV SOLN: 500.0000 mL | INTRAVENOUS | Status: DC | PRN

## 2018-08-23 MED ORDER — COCONUT OIL OIL: 1.0000 "application " | TOPICAL_OIL | Status: DC | PRN

## 2018-08-23 MED ORDER — DIPHENHYDRAMINE HCL 25 MG PO CAPS: 25.0000 mg | ORAL_CAPSULE | Freq: Four times a day (QID) | ORAL | Status: DC | PRN

## 2018-08-23 MED ORDER — SIMETHICONE 80 MG PO CHEW: 80.0000 mg | CHEWABLE_TABLET | ORAL | Status: DC | PRN

## 2018-08-23 NOTE — MAU Note (Signed)
Pt here with c/o contractions and leaking of fluid. Reports good fetal movement. Was 2 cm/50% on last exam.

## 2018-08-23 NOTE — H&P (Addendum)
Danielle Bradley is a 23 y.o. female presenting for painful contractions for two hours and some leakage of fluid.    GBS is Negative  Patient Active Problem List   Diagnosis Date Noted  . Supervision of other normal pregnancy, antepartum 02/13/2018  . Subchorionic hematoma 02/06/2018  . 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 OB History    Gravida  2   Para  1   Term  1   Preterm      AB      Living  1     SAB      TAB      Ectopic      Multiple  0   Live Births  1          Past Medical History:  Diagnosis Date  . Neurofibromatosis, type 1 [facility](HCC)    Past Surgical History:  Procedure Laterality Date  . WISDOM TOOTH EXTRACTION     Family History: family history includes Neurofibromatosis in her maternal grandmother, mother, and sister. Social History:  reports that she has never smoked. She has never used smokeless tobacco. She reports that she does not drink alcohol or use drugs.     Maternal Diabetes: No Genetic Screening: Normal Maternal Ultrasounds/Referrals: Normal Fetal Ultrasounds or other Referrals:  None Maternal Substance Abuse:  No Significant Maternal Medications:  None Significant Maternal Lab Results:  Lab values include: Group B Strep negative Other Comments:  None  Review of Systems  Constitutional: Negative for chills and fever.  Eyes: Negative for blurred vision.  Cardiovascular: Negative for leg swelling.  Gastrointestinal: Positive for abdominal pain. Negative for constipation, diarrhea, nausea and vomiting.  Musculoskeletal: Positive for back pain.   Maternal Medical History:  Reason for admission: Contractions.  Nausea.  Contractions: Onset was 1-2 hours ago.   Frequency: regular.   Perceived severity is strong.    Fetal activity: Perceived fetal activity is normal.   Last perceived fetal movement was within the past hour.    Prenatal complications: Bleeding (bloody show).   No PIH,  pre-eclampsia or preterm labor.   Prenatal Complications - Diabetes: none.    Dilation: 6 Effacement (%): 70 Station: -1 Exam by:: Adah Perlhandra Phillips RN Blood pressure 114/67, pulse 76, temperature (!) 97.2 F (36.2 C), temperature source Oral, resp. rate 18, height 5' (1.524 m), weight 53.5 kg, last menstrual period 11/27/2017, SpO2 97 %, unknown if currently breastfeeding. Maternal Exam:  Uterine Assessment: Contraction strength is firm.  Contraction frequency is regular.   Abdomen: Patient reports no abdominal tenderness. Estimated fetal weight is 6.5lbs.   Fetal presentation: vertex  Introitus: Normal vulva. Vagina is positive for vaginal discharge.  Ferning test: negative.  Nitrazine test: not done. Amniotic fluid character: not assessed.  Pelvis: adequate for delivery.   Cervix: Cervix evaluated by digital exam.     Fetal Exam Fetal Monitor Review: Mode: ultrasound.   Baseline rate: 130.  Variability: moderate (6-25 bpm).   Pattern: accelerations present and no decelerations.    Fetal State Assessment: Category I - tracings are normal.     Physical Exam  Constitutional: She is oriented to person, place, and time. She appears well-developed and well-nourished. No distress.  HENT:  Head: Normocephalic.  Cardiovascular: Normal rate and regular rhythm.  Respiratory: Effort normal. No respiratory distress.  GI: Soft. She exhibits no distension. There is no abdominal tenderness. There is no rebound and no guarding.  Genitourinary:    Vulva normal.  Vaginal discharge present.     Genitourinary Comments: Dilation: 6 Effacement (%): 70 Cervical Position: Middle Station: -1 Presentation: Vertex Exam by:: Adah Perl RN    Musculoskeletal: Normal range of motion.        General: No edema.  Neurological: She is alert and oriented to person, place, and time.  Skin: Skin is warm and dry.  Psychiatric: She has a normal mood and affect.    Prenatal labs: ABO,  Rh: O/Positive/-- (10/07 1026) Antibody: Negative (10/07 1026) Rubella: 1.88 (10/07 1026) RPR: Non Reactive (02/07 1015)  HBsAg: Negative (10/07 1026)  HIV: Non Reactive (02/07 1015)  GBS: Negative (04/03 1034)   Assessment/Plan: Single intrauterine pregnancy at [redacted]w[redacted]d Active Labor (progressed from 3cm to 6cm in an hour) GBS Negative  Admit to Labor and Delivery Routine orders Wants epidural   Wynelle Bourgeois 08/23/2018, 6:28 AM

## 2018-08-23 NOTE — Anesthesia Procedure Notes (Signed)
Epidural Patient location during procedure: OB Start time: 08/23/2018 7:30 AM End time: 08/23/2018 7:45 AM  Staffing Anesthesiologist: Elmer Picker, MD Performed: anesthesiologist   Preanesthetic Checklist Completed: patient identified, pre-op evaluation, timeout performed, IV checked, risks and benefits discussed and monitors and equipment checked  Epidural Patient position: sitting Prep: site prepped and draped and DuraPrep Patient monitoring: continuous pulse ox, blood pressure, heart rate and cardiac monitor Approach: midline Location: L3-L4 Injection technique: LOR air  Needle:  Needle type: Tuohy  Needle gauge: 17 G Needle length: 9 cm Needle insertion depth: 5 cm Catheter type: closed end flexible Catheter size: 19 Gauge Catheter at skin depth: 10 cm Test dose: negative  Assessment Sensory level: T8 Events: blood not aspirated, injection not painful, no injection resistance, negative IV test and no paresthesia  Additional Notes Patient identified. Risks/Benefits/Options discussed with patient including but not limited to bleeding, infection, nerve damage, paralysis, failed block, incomplete pain control, headache, blood pressure changes, nausea, vomiting, reactions to medication both or allergic, itching and postpartum back pain. Confirmed with bedside nurse the patient's most recent platelet count. Confirmed with patient that they are not currently taking any anticoagulation, have any bleeding history or any family history of bleeding disorders. Patient expressed understanding and wished to proceed. All questions were answered. Sterile technique was used throughout the entire procedure. Please see nursing notes for vital signs. Test dose was given through epidural catheter and negative prior to continuing to dose epidural or start infusion. Warning signs of high block given to the patient including shortness of breath, tingling/numbness in hands, complete motor block,  or any concerning symptoms with instructions to call for help. Patient was given instructions on fall risk and not to get out of bed. All questions and concerns addressed with instructions to call with any issues or inadequate analgesia.  Reason for block:procedure for pain

## 2018-08-23 NOTE — Lactation Note (Signed)
This note was copied from a baby's chart. Lactation Consultation Note  Patient Name: Danielle Bradley [id number] Date: 08/23/2018 Reason for consult: Initial assessment;Early term 37-38.6wks  Initial visit with P2 mom, baby is now 36 hours old. Mom states she breastfed for a little while with her first, but less than one month. When asked why she stopped, mom states "it got irritating." LC asked if mom meant physically or mentally, and mom replied "both." Mom denies any changes in her breast during her pregnancy or leaking colostrum. When LC entered room, baby sleeping soundly STS with mom. Mom states the baby just finished feeding for about 30 minutes. Mom denies any pain or discomfort with latch. Reviewed hand expression and mom states she already knows how to do that. Reinforced compression deeper into her breast vs. On the tip on the nipple. Encouraged breast massage and hand expression prior to latching infant. Reviewed feeding 8-12 times in 24 hours and with feeding cues. Discussed feeding cues with mom. Reviewed expected output until milk production around day 3 post partum. Reviewed various positions for feeding and advantages of each. Mom given lactation brochure with phone number and notified of IP/OP lactation services. Mom given handout for virtual breastfeeding support group and extra feeding sheet. Mom states all questions have been answered and encouraged mom to call out if necessary.  Maternal Data Has patient been taught Hand Expression?: Yes(reinforced) Does the patient have breastfeeding experience prior to this delivery?: Yes  Interventions Interventions: Breast feeding basics reviewed;Hand express;Position options;Breast massage;Skin to skin  Consult Status Consult Status: Follow-up Date: 08/24/18 Follow-up type: In-patient    Virgia Land 08/23/2018, 2:03 PM

## 2018-08-23 NOTE — Anesthesia Postprocedure Evaluation (Signed)
Anesthesia Post Note  Patient: Danielle Bradley  Procedure(s) Performed: AN AD HOC LABOR EPIDURAL     Patient location during evaluation: Mother Baby Anesthesia Type: Epidural Level of consciousness: awake and alert Pain management: pain level controlled Vital Signs Assessment: post-procedure vital signs reviewed and stable Respiratory status: spontaneous breathing and nonlabored ventilation Cardiovascular status: blood pressure returned to baseline Postop Assessment: no headache, no backache and able to ambulate Anesthetic complications: no    Last Vitals:  Vitals:   08/23/18 1212 08/23/18 1559  BP: 120/78 127/72  Pulse: 85 97  Resp: 18 18  Temp: 36.7 C 37.5 C  SpO2:      Last Pain:  Vitals:   08/23/18 1559  TempSrc: Oral  PainSc: 0-No pain   Pain Goal:                Epidural/Spinal Function Cutaneous sensation: Normal sensation (08/23/18 1559), Patient able to flex knees: Yes (08/23/18 1559), Patient able to lift hips off bed: Yes (08/23/18 1559), Back pain beyond tenderness at insertion site: No (08/23/18 1559), Progressively worsening motor and/or sensory loss: No (08/23/18 1559), Bowel and/or bladder incontinence post epidural: No (08/23/18 1559)  Sherrie George Nasirah Sachs

## 2018-08-23 NOTE — Discharge Summary (Signed)
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 yo G2P1001 at 369w3das admitted in Active Labor on 08/23/2018. Patient had an uncomplicated labor course, progressing within a couple of hours to SVD after AROM as augmentation.  Membrane Rupture Time/Date: 8:20 AM ,08/23/2018   Intrapartum Procedures: Episiotomy: None [1]                                         Lacerations:  None [1];1st degree [2];Perineal [11]  Patient had a delivery of a Viable infant. 08/23/2018  Information for the patient's newborn:  Danielle, [name]0[[id number]]    Pateint had an uncomplicated postpartum course.  She is ambulating, tolerating a regular diet, passing flatus, and urinating well. Patient is discharged home in stable condition on 08/24/18.   Magnesium Sulfate recieved: No BMZ received: No  Physical exam  Vitals:   08/23/18 1212 08/23/18 1559 08/23/18 2008 08/24/18 0548  BP: 120/78 127/72 117/77 114/69  Pulse: 85 97 78 84  Resp: _0 Temp: 98 F (36.7 C) 99.5 F (37.5 C) 98.6 F (37 C) 98.4 F (36.9 C)  TempSrc: Oral Oral Oral Oral  SpO2:   100% 99%  Weight:      Height:       General: alert and cooperative Lochia: appropriate Uterine Fundus: firm Incision: N/A DVT Evaluation: No evidence of DVT seen  on physical exam. Labs: Lab Results  Component Value Date   WBC 10.9 (H) 08/23/2018   HGB 9.9 (L) 08/23/2018   HCT 32.7 (L) 08/23/2018   MCV 84.9 08/23/2018   PLT 205 08/23/2018   CMP Latest Ref Rng & Units 02/21/2015  Glucose 65 - 99 mg/dL 97  BUN 6 - 20 mg/dL 11  Creatinine 0.44 - 1.00 mg/dL 0.75  Sodium 135 - 145 mmol/L 140  Potassium 3.5 - 5.1 mmol/L 3.9  Chloride 101 - 111 mmol/L 107  CO2 22 - 32 mmol/L 26  Calcium 8.9 - 10.3 mg/dL 9.3  Total Protein 6.0 - 8.3 g/dL -  Total Bilirubin 0.3 - 1.2 mg/dL -  Alkaline Phos 39 - 117 U/L -  AST 0 - 37 U/L -  ALT 0 - 35 U/L -    Discharge instruction: per After Visit Summary  and "Baby and Me Booklet".  After visit meds:  Allergies as of 08/24/2018   No Known Allergies     Medication List    STOP taking these medications   Blood Pressure Monitor Kit     TAKE these medications   ibuprofen 600 MG tablet Commonly known as:  ADVIL,MOTRIN Take 1 tablet (600 mg total) by mouth every 6 (six) hours as needed.   multivitamin-prenatal 27-0.8 MG Tabs tablet Take 1 tablet by mouth daily at 12 noon.       Diet: routine diet  Activity: Advance as tolerated. Pelvic rest for 6 weeks.   Outpatient follow up:4 weeks Follow up Appt: Future Appointments  Date Time Provider [place]  09/20/2018  1:15 PM Lajean Manes, CNM [facility] None   Follow up Visit:  Please schedule this patient for Postpartum visit in: 4 weeks with the following provider: Any provider For C/S patients schedule nurse incision check in weeks 2 weeks: no Low risk pregnancy complicated by: none Delivery mode:  SVD Anticipated Birth Control:  Depo PP Procedures needed: none  Schedule Integrated BH visit: no  Newborn Data: Live born female  Birth Weight: 2940gm APGAR: 1, 9  Newborn Delivery   Birth date/time:  08/23/2018 09:08:00 Delivery type:  Vaginal, Spontaneous     Baby Feeding: Bottle and Breast Disposition:home with  mother   08/24/2018 Danielle Bradley, CNM  9:11 AM

## 2018-08-23 NOTE — Anesthesia Preprocedure Evaluation (Signed)
Anesthesia Evaluation  Patient identified by MRN, date of birth, ID band Patient awake    Reviewed: Allergy & Precautions, NPO status , Patient's Chart, lab work & pertinent test results  Airway Mallampati: I  TM Distance: >3 FB Neck ROM: Full    Dental no notable dental hx.    Pulmonary neg pulmonary ROS,    Pulmonary exam normal breath sounds clear to auscultation       Cardiovascular negative cardio ROS Normal cardiovascular exam Rhythm:Regular Rate:Normal     Neuro/Psych NF1, asymptomatic negative neurological ROS  negative psych ROS   GI/Hepatic negative GI ROS, Neg liver ROS,   Endo/Other  negative endocrine ROS  Renal/GU negative Renal ROS  negative genitourinary   Musculoskeletal negative musculoskeletal ROS (+)   Abdominal   Peds  Hematology negative hematology ROS (+)   Anesthesia Other Findings   Reproductive/Obstetrics (+) Pregnancy                             Anesthesia Physical Anesthesia Plan  ASA: II  Anesthesia Plan: Epidural   Post-op Pain Management:    Induction:   PONV Risk Score and Plan: Treatment may vary due to age or medical condition  Airway Management Planned: Natural Airway  Additional Equipment:   Intra-op Plan:   Post-operative Plan:   Informed Consent: I have reviewed the patients History and Physical, chart, labs and discussed the procedure including the risks, benefits and alternatives for the proposed anesthesia with the patient or authorized representative who has indicated his/her understanding and acceptance.       Plan Discussed with: Anesthesiologist  Anesthesia Plan Comments: (Patient identified. Risks, benefits, options discussed with patient including but not limited to bleeding, infection, nerve damage, paralysis, failed block, incomplete pain control, headache, blood pressure changes, nausea, vomiting, reactions to  medication, itching, and post partum back pain. Confirmed with bedside nurse the patient's most recent platelet count. Confirmed with the patient that they are not taking any anticoagulation, have any bleeding history or any family history of bleeding disorders. Patient expressed understanding and wishes to proceed. All questions were answered. )        Anesthesia Quick Evaluation

## 2018-08-24 MED ORDER — IBUPROFEN 600 MG PO TABS: 600.0000 mg | ORAL_TABLET | Freq: Four times a day (QID) | ORAL | 0 refills | Status: DC | PRN

## 2018-08-24 NOTE — Discharge Instructions (Signed)
Vaginal Delivery, Care After °Refer to this sheet in the next few weeks. These instructions provide you with information about caring for yourself after vaginal delivery. Your health care provider may also give you more specific instructions. Your treatment has been planned according to current medical practices, but problems sometimes occur. Call your health care provider if you have any problems or questions. °What can I expect after the procedure? °After vaginal delivery, it is common to have: °· Some bleeding from your vagina. °· Soreness in your abdomen, your vagina, and the area of skin between your vaginal opening and your anus (perineum). °· Pelvic cramps. °· Fatigue. °Follow these instructions at home: °Medicines °· Take over-the-counter and prescription medicines only as told by your health care provider. °· If you were prescribed an antibiotic medicine, take it as told by your health care provider. Do not stop taking the antibiotic until it is finished. °Driving ° °· Do not drive or operate heavy machinery while taking prescription pain medicine. °· Do not drive for 24 hours if you received a sedative. °Lifestyle °· Do not drink alcohol. This is especially important if you are breastfeeding or taking medicine to relieve pain. °· Do not use tobacco products, including cigarettes, chewing tobacco, or e-cigarettes. If you need help quitting, ask your health care provider. °Eating and drinking °· Drink at least 8 eight-ounce glasses of water every day unless you are told not to by your health care provider. If you choose to breastfeed your baby, you may need to drink more water than this. °· Eat high-fiber foods every day. These foods may help prevent or relieve constipation. High-fiber foods include: °? Whole grain cereals and breads. °? Brown rice. °? Beans. °? Fresh fruits and vegetables. °Activity °· Return to your normal activities as told by your health care provider. Ask your health care provider what  activities are safe for you. °· Rest as much as possible. Try to rest or take a nap when your baby is sleeping. °· Do not lift anything that is heavier than your baby or 10 lb (4.5 kg) until your health care provider says that it is safe. °· Talk with your health care provider about when you can engage in sexual activity. This may depend on your: °? Risk of infection. °? Rate of healing. °? Comfort and desire to engage in sexual activity. °Vaginal Care °· If you have an episiotomy or a vaginal tear, check the area every day for signs of infection. Check for: °? More redness, swelling, or pain. °? More fluid or blood. °? Warmth. °? Pus or a bad smell. °· Do not use tampons or douches until your health care provider says this is safe. °· Watch for any blood clots that may pass from your vagina. These may look like clumps of dark red, brown, or black discharge. °General instructions °· Keep your perineum clean and dry as told by your health care provider. °· Wear loose, comfortable clothing. °· Wipe from front to back when you use the toilet. °· Ask your health care provider if you can shower or take a bath. If you had an episiotomy or a perineal tear during labor and delivery, your health care provider may tell you not to take baths for a certain length of time. °· Wear a bra that supports your breasts and fits you well. °· If possible, have someone help you with household activities and help care for your baby for at least a few days after you   leave the hospital. °· Keep all follow-up visits for you and your baby as told by your health care provider. This is important. °Contact a health care provider if: °· You have: °? Vaginal discharge that has a bad smell. °? Difficulty urinating. °? Pain when urinating. °? A sudden increase or decrease in the frequency of your bowel movements. °? More redness, swelling, or pain around your episiotomy or vaginal tear. °? More fluid or blood coming from your episiotomy or vaginal  tear. °? Pus or a bad smell coming from your episiotomy or vaginal tear. °? A fever. °? A rash. °? Little or no interest in activities you used to enjoy. °? Questions about caring for yourself or your baby. °· Your episiotomy or vaginal tear feels warm to the touch. °· Your episiotomy or vaginal tear is separating or does not appear to be healing. °· Your breasts are painful, hard, or turn red. °· You feel unusually sad or worried. °· You feel nauseous or you vomit. °· You pass large blood clots from your vagina. If you pass a blood clot from your vagina, save it to show to your health care provider. Do not flush blood clots down the toilet without having your health care provider look at them. °· You urinate more than usual. °· You are dizzy or light-headed. °· You have not breastfed at all and you have not had a menstrual period for 12 weeks after delivery. °· You have stopped breastfeeding and you have not had a menstrual period for 12 weeks after you stopped breastfeeding. °Get help right away if: °· You have: °? Pain that does not go away or does not get better with medicine. °? Chest pain. °? Difficulty breathing. °? Blurred vision or spots in your vision. °? Thoughts about hurting yourself or your baby. °· You develop pain in your abdomen or in one of your legs. °· You develop a severe headache. °· You faint. °· You bleed from your vagina so much that you fill two sanitary pads in one hour. °This information is not intended to replace advice given to you by your health care provider. Make sure you discuss any questions you have with your health care provider. °Document Released: 04/23/2000 Document Revised: 10/08/2015 Document Reviewed: 05/11/2015 °Elsevier Interactive Patient Education © 2019 [employer] ° °

## 2018-08-25 ENCOUNTER — Encounter: Payer: Medicaid Other | Admitting: Medical

## 2018-08-25 LAB — RPR: RPR Ser Ql: NONREACTIVE

## 2018-09-04 ENCOUNTER — Other Ambulatory Visit: Payer: Self-pay | Admitting: Medical

## 2018-09-04 DIAGNOSIS — N898 Other specified noninflammatory disorders of vagina: Secondary | ICD-10-CM

## 2018-09-04 MED ORDER — METRONIDAZOLE 500 MG PO TABS: 500.0000 mg | ORAL_TABLET | Freq: Two times a day (BID) | ORAL | 0 refills | Status: DC

## 2018-09-20 ENCOUNTER — Ambulatory Visit: Payer: Medicaid Other | Admitting: Certified Nurse Midwife

## 2018-09-27 ENCOUNTER — Telehealth: Payer: Self-pay | Admitting: Obstetrics

## 2018-10-31 ENCOUNTER — Other Ambulatory Visit: Payer: Self-pay

## 2018-10-31 DIAGNOSIS — N898 Other specified noninflammatory disorders of vagina: Secondary | ICD-10-CM

## 2018-10-31 MED ORDER — METRONIDAZOLE 500 MG PO TABS: 500.0000 mg | ORAL_TABLET | Freq: Two times a day (BID) | ORAL | 0 refills | Status: DC

## 2018-10-31 NOTE — Progress Notes (Signed)
Pt having BV symptoms. Flagyl sent per protocol.

## 2018-11-08 ENCOUNTER — Ambulatory Visit: Payer: Medicaid Other | Admitting: Certified Nurse Midwife

## 2018-11-09 ENCOUNTER — Telehealth: Payer: Self-pay | Admitting: Certified Nurse Midwife

## 2019-01-02 ENCOUNTER — Other Ambulatory Visit: Payer: Self-pay

## 2019-01-02 ENCOUNTER — Ambulatory Visit: Payer: Medicaid Other | Admitting: Obstetrics & Gynecology

## 2019-01-02 ENCOUNTER — Encounter: Payer: Self-pay | Admitting: Obstetrics & Gynecology

## 2019-01-02 ENCOUNTER — Other Ambulatory Visit (HOSPITAL_COMMUNITY)
Admission: RE | Admit: 2019-01-02 | Discharge: 2019-01-02 | Disposition: A | Discharge status: Home / Self Care | Payer: Medicaid Other | Source: Ambulatory Visit | Attending: Obstetrics & Gynecology | Admitting: Obstetrics & Gynecology

## 2019-01-02 VITALS — BP 119/72 | HR 80 | Ht 60.0 in | Wt 94.0 lb

## 2019-01-02 DIAGNOSIS — N898 Other specified noninflammatory disorders of vagina: Secondary | ICD-10-CM

## 2019-01-02 DIAGNOSIS — Z3042 Encounter for surveillance of injectable contraceptive: Secondary | ICD-10-CM | POA: Diagnosis not present

## 2019-01-02 DIAGNOSIS — Z01812 Encounter for preprocedural laboratory examination: Secondary | ICD-10-CM

## 2019-01-02 DIAGNOSIS — Z3202 Encounter for pregnancy test, result negative: Secondary | ICD-10-CM

## 2019-01-02 LAB — POCT URINE PREGNANCY: Preg Test, Ur: NEGATIVE

## 2019-01-02 MED ORDER — MEDROXYPROGESTERONE ACETATE 150 MG/ML IM SUSP
150.0000 mg | Freq: Once | INTRAMUSCULAR | Status: AC
  Administered 2019-01-02: 11:00:00 150 mg via INTRAMUSCULAR

## 2019-01-02 NOTE — Progress Notes (Signed)
   Subjective:    Patient ID: Danielle Bradley, female    DOB: 11/16/1995, 23 y.o.   MRN: [id number]  HPI 23 yo single P2 (37 yo son and 51 month old daughter) here today to discuss contraception. She has used depo provera and OCPs in the past. She forgets to take OCPs and wants to restart depo provera. She had sex about 3 weeks ago (unprotected). Her sister had Nexplanon and IUD and had problems. She doesn't want to try these. She did get pregnant with her 21 yo son when she forgot to get her depo provera injection.  She is not having any problems today except for recurrent BV around the time of her period. She has heard about boric acid but has not tried it.   Review of Systems Pap smear normal in 2019    Objective:   Physical Exam Breathing, conversing, and ambulating normally Well nourished, well hydrated Black female, no apparent distress     Assessment & Plan:  Recurrent BV- rec boric acid She is requesting GCCT testing. She declines flu vaccine today She will check with her mom to see if she had the Gardasil series when she was younger.

## 2019-01-02 NOTE — Progress Notes (Signed)
Pt is in the office for [facility] consult, pt desires depo. LMP 12-10-18.  Pt reports vaginal d/c, possible BV. Pt wants std testing. Administered depo in RUOQ and pt tolerated well. Next due 11/10-11/24. .. Administrations This Visit    medroxyPROGESTERone (DEPO-PROVERA) injection 150 mg    Admin Date 01/02/2019 Action Given Dose 150 mg Route Intramuscular Administered By Hinton Lovely, RN

## 2019-01-06 LAB — CERVICOVAGINAL ANCILLARY ONLY
Candida vaginitis: NEGATIVE
Chlamydia: NEGATIVE
Neisseria Gonorrhea: NEGATIVE
Trichomonas: NEGATIVE

## 2019-01-08 ENCOUNTER — Other Ambulatory Visit: Payer: Self-pay | Admitting: Obstetrics & Gynecology

## 2019-01-08 MED ORDER — METRONIDAZOLE 500 MG PO TABS: 500.0000 mg | ORAL_TABLET | Freq: Two times a day (BID) | ORAL | 4 refills | Status: DC

## 2019-01-08 NOTE — Progress Notes (Unsigned)
Flagyl with refills ordered due to recurrent bv

## 2019-02-14 ENCOUNTER — Ambulatory Visit (HOSPITAL_COMMUNITY)
Admission: EM | Admit: 2019-02-14 | Discharge: 2019-02-14 | Disposition: A | Discharge status: Home / Self Care | Payer: Medicaid Other | Attending: Family Medicine | Admitting: Family Medicine

## 2019-02-14 ENCOUNTER — Other Ambulatory Visit: Payer: Self-pay

## 2019-02-14 ENCOUNTER — Encounter (HOSPITAL_COMMUNITY): Payer: Self-pay | Admitting: Family Medicine

## 2019-02-14 DIAGNOSIS — Z20828 Contact with and (suspected) exposure to other viral communicable diseases: Secondary | ICD-10-CM | POA: Insufficient documentation

## 2019-02-14 DIAGNOSIS — B349 Viral infection, unspecified: Secondary | ICD-10-CM | POA: Insufficient documentation

## 2019-02-14 NOTE — Discharge Instructions (Addendum)
This is most likely some sort of viral illness versus allergies.  He can keep taking medication you are taking over-the-counter for symptoms.  Maybe add in some Zyrtec or some sort of antihistamine daily to see if this helps.  We will call you if your COVID  results are positive, other wise you can check your my chart for results.

## 2019-02-14 NOTE — ED Triage Notes (Signed)
Pt. States she has had body aches, headaches, nasal congestion, cough, sneezing symptoms since Saturday.

## 2019-02-14 NOTE — ED Provider Notes (Signed)
[place]    CSN: [id number] Arrival date & time: 02/14/19  1425      History   Chief Complaint Chief Complaint  Patient presents with  . Generalized Body Aches    FLU LIKE SYMPTOMS FOR 4 DAYS    HPI Kandance Roseman is a 23 y.o. female.   Patient is a 23 year old female that presents today with generalized body aches, headaches, nasal congestion, cough, sneezing.  Symptoms have been constant since Saturday.  She has been taking over-the-counter cold and flu medication along with Tylenol for her symptoms.  This seems to help slightly.  Denies any known sick contacts or COVID exposures.  Denies any fever, chills, body aches or night sweats.   ROS per HPI      Past Medical History:  Diagnosis Date  . Neurofibromatosis, type 1 [facility])     Patient Active Problem List   Diagnosis Date Noted  . Indication for care in labor or delivery 08/23/2018  . Supervision of other normal pregnancy, antepartum 02/13/2018  . Subchorionic hematoma 02/06/2018  . Neurofibromatosis, type I (von Recklinghausen's disease) ([place]) 03/26/2014    Past Surgical History:  Procedure Laterality Date  . WISDOM TOOTH EXTRACTION      OB History    Gravida  2   Para  2   Term  2   Preterm      AB      Living  2     SAB      TAB      Ectopic      Multiple  0   Live Births  2            Home Medications    Prior to Admission medications   Medication Sig Start Date End Date Taking? Authorizing Provider  ibuprofen (ADVIL,MOTRIN) 600 MG tablet Take 1 tablet (600 mg total) by mouth every 6 (six) hours as needed. 08/24/18   Myrtis Ser, CNM  Prenatal Vit-Fe Fumarate-FA (MULTIVITAMIN-PRENATAL) 27-0.8 MG TABS tablet Take 1 tablet by mouth daily at 12 noon.    [provider]    Family History Family History  Problem Relation Age of Onset  . Neurofibromatosis Mother   . Neurofibromatosis Sister   . Neurofibromatosis Maternal Grandmother     Social  History Social History   Tobacco Use  . Smoking status: Never Smoker  . Smokeless tobacco: Never Used  Substance Use Topics  . Alcohol use: No    Alcohol/week: 0.0 standard drinks  . Drug use: No     Allergies   Patient has no known allergies.   Review of Systems Review of Systems   Physical Exam Triage Vital Signs ED Triage Vitals  Enc Vitals Group     BP 02/14/19 1508 (!) 107/91     Pulse Rate 02/14/19 1508 (!) 113     Resp --      Temp 02/14/19 1508 98.7 F (37.1 C)     Temp Source 02/14/19 1508 Oral     SpO2 02/14/19 1508 98 %     Weight 02/14/19 1512 93 lb 14.7 oz (42.6 kg)     Height --      Head Circumference --      Peak Flow --      Pain Score 02/14/19 1512 6     Pain Loc --      Pain Edu? --      Excl. in [place]? --    No data found.  Updated  Vital Signs BP (!) 107/91 (BP Location: Left Arm)   Pulse (!) 113   Temp 98.7 F (37.1 C) (Oral)   Wt 93 lb 14.7 oz (42.6 kg)   SpO2 98%   BMI 18.34 kg/m   Visual Acuity Right Eye Distance:   Left Eye Distance:   Bilateral Distance:    Right Eye Near:   Left Eye Near:    Bilateral Near:     Physical Exam Vitals signs and nursing note reviewed.  Constitutional:      General: She is not in acute distress.    Appearance: Normal appearance. She is not ill-appearing, toxic-appearing or diaphoretic.  HENT:     Head: Normocephalic and atraumatic.     Right Ear: Tympanic membrane and ear canal normal.     Left Ear: Tympanic membrane and ear canal normal.     Nose: Congestion and rhinorrhea present.     Mouth/Throat:     Pharynx: Oropharynx is clear.  Eyes:     Conjunctiva/sclera: Conjunctivae normal.  Neck:     Musculoskeletal: Normal range of motion.  Cardiovascular:     Rate and Rhythm: Normal rate and regular rhythm.     Pulses: Normal pulses.     Heart sounds: Normal heart sounds.  Pulmonary:     Effort: Pulmonary effort is normal.     Breath sounds: Normal breath sounds.  Musculoskeletal:  Normal range of motion.  Skin:    General: Skin is warm and dry.  Neurological:     Mental Status: She is alert.  Psychiatric:        Mood and Affect: Mood normal.      UC Treatments / Results  Labs (all labs ordered are listed, but only abnormal results are displayed) Labs Reviewed  NOVEL CORONAVIRUS, NAA (HOSP ORDER, SEND-OUT TO REF LAB; TAT 18-24 HRS)    EKG   Radiology No results found.  Procedures Procedures (including critical care time)  Medications Ordered in UC Medications - No data to display  Initial Impression / Assessment and Plan / UC Course  I have reviewed the triage vital signs and the nursing notes.  Pertinent labs & imaging results that were available during my care of the patient were reviewed by me and considered in my medical decision making (see chart for details).     Viral URI- symptomatic treatment with OTC meds as needed.  COVID testing done with labs pending.  Recommended zyrtec  Final Clinical Impressions(s) / UC Diagnoses   Final diagnoses:  Viral illness     Discharge Instructions     This is most likely some sort of viral illness versus allergies.  He can keep taking medication you are taking over-the-counter for symptoms.  Maybe add in some Zyrtec or some sort of antihistamine daily to see if this helps.  We will call you if your COVID  results are positive, other wise you can check your my chart for results.     ED Prescriptions    None     PDMP not reviewed this encounter.   Janace Aris, NP 02/14/19 1610

## 2019-02-16 LAB — NOVEL CORONAVIRUS, NAA (HOSP ORDER, SEND-OUT TO REF LAB; TAT 18-24 HRS): SARS-CoV-2, NAA: NOT DETECTED

## 2019-03-22 ENCOUNTER — Ambulatory Visit: Payer: Medicaid Other

## 2019-04-10 ENCOUNTER — Telehealth: Payer: Self-pay | Admitting: Obstetrics

## 2019-04-27 ENCOUNTER — Other Ambulatory Visit: Payer: Self-pay | Admitting: *Deleted

## 2019-04-27 MED ORDER — METRONIDAZOLE 500 MG PO TABS: 500.0000 mg | ORAL_TABLET | Freq: Two times a day (BID) | ORAL | 0 refills | Status: DC

## 2019-04-27 NOTE — Progress Notes (Signed)
Flagyl sent in for BV per protocol

## 2019-06-26 ENCOUNTER — Ambulatory Visit: Payer: Medicaid Other

## 2019-08-22 ENCOUNTER — Other Ambulatory Visit: Payer: Self-pay

## 2019-09-12 ENCOUNTER — Other Ambulatory Visit: Payer: Self-pay

## 2019-09-12 ENCOUNTER — Encounter (HOSPITAL_COMMUNITY): Payer: Self-pay | Admitting: Emergency Medicine

## 2019-09-12 ENCOUNTER — Ambulatory Visit (HOSPITAL_COMMUNITY)
Admission: EM | Admit: 2019-09-12 | Discharge: 2019-09-12 | Disposition: A | Discharge status: Home / Self Care | Payer: Medicaid Other | Attending: Family Medicine | Admitting: Family Medicine

## 2019-09-12 DIAGNOSIS — R0789 Other chest pain: Secondary | ICD-10-CM

## 2019-09-12 DIAGNOSIS — Z3202 Encounter for pregnancy test, result negative: Secondary | ICD-10-CM | POA: Diagnosis not present

## 2019-09-12 DIAGNOSIS — R519 Headache, unspecified: Secondary | ICD-10-CM

## 2019-09-12 DIAGNOSIS — R1030 Lower abdominal pain, unspecified: Secondary | ICD-10-CM | POA: Diagnosis not present

## 2019-09-12 LAB — POC URINE PREG, ED: Preg Test, Ur: NEGATIVE

## 2019-09-12 MED ORDER — METRONIDAZOLE 500 MG PO TABS: 500.0000 mg | ORAL_TABLET | Freq: Two times a day (BID) | ORAL | 0 refills | Status: AC

## 2019-09-12 MED ORDER — KETOROLAC TROMETHAMINE 30 MG/ML IJ SOLN
30.0000 mg | Freq: Once | INTRAMUSCULAR | Status: AC
  Administered 2019-09-12: 30 mg via INTRAMUSCULAR

## 2019-09-12 MED ORDER — KETOROLAC TROMETHAMINE 30 MG/ML IJ SOLN
INTRAMUSCULAR | Status: AC
  Filled 2019-09-12: qty 1

## 2019-09-12 MED ORDER — DEXAMETHASONE SODIUM PHOSPHATE 10 MG/ML IJ SOLN
INTRAMUSCULAR | Status: AC
  Filled 2019-09-12: qty 1

## 2019-09-12 MED ORDER — METOCLOPRAMIDE HCL 5 MG/ML IJ SOLN
5.0000 mg | Freq: Once | INTRAMUSCULAR | Status: AC
  Administered 2019-09-12: 5 mg via INTRAMUSCULAR

## 2019-09-12 MED ORDER — DEXAMETHASONE SODIUM PHOSPHATE 10 MG/ML IJ SOLN
10.0000 mg | Freq: Once | INTRAMUSCULAR | Status: AC
  Administered 2019-09-12: 10 mg via INTRAMUSCULAR

## 2019-09-12 MED ORDER — NAPROXEN 500 MG PO TABS: 500.0000 mg | ORAL_TABLET | Freq: Two times a day (BID) | ORAL | 0 refills | Status: DC

## 2019-09-12 MED ORDER — METOCLOPRAMIDE HCL 5 MG/ML IJ SOLN
INTRAMUSCULAR | Status: AC
  Filled 2019-09-12: qty 2

## 2019-09-12 NOTE — ED Provider Notes (Signed)
[facility]    CSN: [id number] Arrival date & time: 09/12/19  1251      History   Chief Complaint Chief Complaint  Patient presents with  . Abdominal Pain  . Chest Pain  . Headache    HPI Danielle Bradley is a 24 y.o. female presenting today for evaluation of headache, chest pain and abdominal pain.  Patient states that over the past 2 weeks she has had worsening and persistent headaches.  Headaches wax and wane, but are more prominent than her previous headaches.  Located in bilateral frontal areas.  Denies vision changes or difficulty speaking.  Does report some occasional photophobia.  Has used ibuprofen which has helped the symptoms, but no full relief.  Denies any URI symptoms of cough congestion or sore throat.  Denies any fevers or neck stiffness.  She has had some chest discomfort which patient feels is unrelated to her headache.  Chest discomfort has been off and on for approximately 2 weeks as well.  Worse with bending lifting and certain movements.  Denies any difficulty breathing or shortness of breath.  Denies any injury or trauma to the area.  She also has had some lower abdominal pain.  She reports associated urinary frequency and some discharge with some mild irritation.  Feels similar to prior BV.  Would like to be screened for STDs.  Last menstrual cycle was 4/15.  Is not on birth control.  Reports negative pregnancy test at home today.  HPI  Past Medical History:  Diagnosis Date  . Neurofibromatosis, type 1 [facility])     Patient Active Problem List   Diagnosis Date Noted  . Indication for care in labor or delivery 08/23/2018  . Supervision of other normal pregnancy, antepartum 02/13/2018  . Subchorionic hematoma 02/06/2018  . 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 WISDOM TOOTH EXTRACTION      OB History    Gravida  2   Para  2   Term  2   Preterm      AB       Living  2     SAB      TAB      Ectopic      Multiple  0   Live Births  2            Home Medications    Prior to Admission medications   Medication Sig Start Date End Date Taking? Authorizing Provider  metroNIDAZOLE (FLAGYL) 500 MG tablet Take 1 tablet (500 mg total) by mouth 2 (two) times daily for 7 days. 09/12/19 09/19/19  Drequan Ironside C, PA-C  naproxen (NAPROSYN) 500 MG tablet Take 1 tablet (500 mg total) by mouth 2 (two) times daily. 09/12/19   Charle Clear C, PA-C  Prenatal Vit-Fe Fumarate-FA (MULTIVITAMIN-PRENATAL) 27-0.8 MG TABS tablet Take 1 tablet by mouth daily at 12 noon.    [provider]    Family History Family History  Problem Relation Age of Onset  . Neurofibromatosis Mother   . Neurofibromatosis Sister   . Neurofibromatosis Maternal Grandmother     Social History Social History   Tobacco Use  . Smoking status: Never Smoker  . Smokeless tobacco: Never Used  Substance Use Topics  . Alcohol use: No    Alcohol/week: 0.0 standard drinks  . Drug use: No     Allergies   Patient has no known allergies.   Review of  Systems Review of Systems  Constitutional: Negative for fatigue and fever.  HENT: Negative for congestion, sinus pressure and sore throat.   Eyes: Negative for photophobia, pain and visual disturbance.  Respiratory: Negative for cough and shortness of breath.   Cardiovascular: Positive for chest pain. Negative for leg swelling.  Gastrointestinal: Negative for abdominal pain, diarrhea, nausea and vomiting.  Genitourinary: Positive for frequency and vaginal discharge. Negative for decreased urine volume, dysuria, flank pain, genital sores, hematuria, menstrual problem, vaginal bleeding and vaginal pain.  Musculoskeletal: Negative for back pain, myalgias, neck pain and neck stiffness.  Skin: Negative for rash.  Neurological: Positive for headaches. Negative for dizziness, syncope, facial asymmetry, speech difficulty,  weakness, light-headedness and numbness.     Physical Exam Triage Vital Signs ED Triage Vitals  Enc Vitals Group     BP 09/12/19 1318 134/74     Pulse Rate 09/12/19 1318 91     Resp 09/12/19 1318 16     Temp 09/12/19 1318 98.7 F (37.1 C)     Temp Source 09/12/19 1318 Oral     SpO2 09/12/19 1318 100 %     Weight --      Height --      Head Circumference --      Peak Flow --      Pain Score 09/12/19 1321 5     Pain Loc --      Pain Edu? --      Excl. in [place]? --    No data found.  Updated Vital Signs BP 134/74   Pulse 91   Temp 98.7 F (37.1 C) (Oral)   Resp 16   LMP 08/24/2019   SpO2 100%   Visual Acuity Right Eye Distance:   Left Eye Distance:   Bilateral Distance:    Right Eye Near:   Left Eye Near:    Bilateral Near:     Physical Exam Vitals and nursing note reviewed.  Constitutional:      Appearance: She is well-developed.     Comments: No acute distress  HENT:     Head: Normocephalic and atraumatic.     Ears:     Comments: Bilateral ears without tenderness to palpation of external auricle, tragus and mastoid, EAC's without erythema or swelling, TM's with good bony landmarks and cone of light. Non erythematous.     Nose: Nose normal.     Mouth/Throat:     Comments: Oral mucosa pink and moist, no tonsillar enlargement or exudate. Posterior pharynx patent and nonerythematous, no uvula deviation or swelling. Normal phonation. Eyes:     Extraocular Movements: Extraocular movements intact.     Conjunctiva/sclera: Conjunctivae normal.     Pupils: Pupils are equal, round, and reactive to light.  Cardiovascular:     Rate and Rhythm: Normal rate and regular rhythm.  Pulmonary:     Effort: Pulmonary effort is normal. No respiratory distress.     Comments: Breathing comfortably at rest, CTABL, no wheezing, rales or other adventitious sounds auscultated  Right anterior chest tender to palpation Abdominal:     General: There is no distension.     Tenderness:  There is abdominal tenderness.     Comments: Abdomen soft, nondistended, tender to palpation throughout bilateral lower quadrants, negative rebound, negative Rovsing, negative McBurney's, negative Murphy's  Musculoskeletal:        General: Normal range of motion.     Cervical back: Normal range of motion and neck supple.  Skin:    General: Skin  is warm and dry.  Neurological:     General: No focal deficit present.     Mental Status: She is alert and oriented to person, place, and time.     Comments: Patient A&O x3, cranial nerves II-XII grossly intact, strength at shoulders, hips and knees 5/5, equal bilaterally, patellar reflex 2+ bilaterally.  Gait without abnormality.      UC Treatments / Results  Labs (all labs ordered are listed, but only abnormal results are displayed) Labs Reviewed  POC URINE PREG, ED  CERVICOVAGINAL ANCILLARY ONLY    EKG   Radiology No results found.  Procedures Procedures (including critical care time)  Medications Ordered in UC Medications  ketorolac (TORADOL) 30 MG/ML injection 30 mg (30 mg Intramuscular Given 09/12/19 1435)  metoCLOPramide (REGLAN) injection 5 mg (5 mg Intramuscular Given 09/12/19 1435)  dexamethasone (DECADRON) injection 10 mg (10 mg Intramuscular Given 09/12/19 1434)    Initial Impression / Assessment and Plan / UC Course  I have reviewed the triage vital signs and the nursing notes.  Pertinent labs & imaging results that were available during my care of the patient were reviewed by me and considered in my medical decision making (see chart for details).     1.  Headache-no red flags, no neuro deficits on exam, not responding to oral medicine, will provide migraine cocktail in clinic and provide Toradol, Decadron and Reglan.  Will provide Naprosyn to use as alternative at home for further headaches as alternative to ibuprofen.  Advised to follow-up with PCP if developing continued recurrent headaches for further evaluation and  discussion around management.  2.  Chest pain-reproducible on exam, negative risk factors for cardiac etiology, lungs clear, no hypoxia or tachycardia, most likely chest wall pain/inflammation.  Recommending continued anti-inflammatories.  Monitor for improvement with medicines provided for headache today.  3.  Abdominal pain-suspect likely BV, empirically treating for this today with metronidazole.  Vaginal swab pending to screen for STDs and other causes of abdominal pain.  Do not suspect GI cause at this time.  Pregnancy test negative.  Discussed strict return precautions. Patient verbalized understanding and is agreeable with plan.  Final Clinical Impressions(s) / UC Diagnoses   Final diagnoses:  Acute nonintractable headache, unspecified headache type  Chest wall pain  Lower abdominal pain     Discharge Instructions     We gave you an injection of Toradol, Decadron and Reglan today for your headache Continue with Naprosyn twice daily with food for chest pain and headache as needed -If you continue to have frequent/recurrent headaches please follow-up with primary care  May begin metronidazole twice daily x1 week.  No alcohol while taking.  This medicine will treat BV.  We are testing you for Gonorrhea, Chlamydia, Trichomonas, Yeast and Bacterial Vaginosis. We will call you if anything is positive and let you know if you require any further treatment. Please inform partners of any positive results.   Please return if symptoms not improving with treatment, development of fever, nausea, vomiting, abdominal pain.     ED Prescriptions    Medication Sig Dispense Auth. Provider   naproxen (NAPROSYN) 500 MG tablet Take 1 tablet (500 mg total) by mouth 2 (two) times daily. 30 tablet Shanequia Kendrick C, PA-C   metroNIDAZOLE (FLAGYL) 500 MG tablet Take 1 tablet (500 mg total) by mouth 2 (two) times daily for 7 days. 14 tablet Regana Kemple, [place] C, PA-C     PDMP not reviewed this  encounter.   Aprile Dickenson, [place] C,  PA-C 09/12/19 1450

## 2019-09-12 NOTE — ED Triage Notes (Addendum)
PT reports intermittent right chest pain for two weeks. Worsens with movement, bending, picking things up. No radiation. Aching pain. No associated nausea, diaphoresis. Pain reproducible with palpation.   Intermittent headache for 2 weeks.   Abdominal pain intermittently for last week. No nausea, vomiting, diarrhea.   Adds that she would like testing as well, BV symptoms.

## 2019-09-12 NOTE — Discharge Instructions (Signed)
We gave you an injection of Toradol, Decadron and Reglan today for your headache Continue with Naprosyn twice daily with food for chest pain and headache as needed -If you continue to have frequent/recurrent headaches please follow-up with primary care  May begin metronidazole twice daily x1 week.  No alcohol while taking.  This medicine will treat BV.  We are testing you for Gonorrhea, Chlamydia, Trichomonas, Yeast and Bacterial Vaginosis. We will call you if anything is positive and let you know if you require any further treatment. Please inform partners of any positive results.   Please return if symptoms not improving with treatment, development of fever, nausea, vomiting, abdominal pain.

## 2019-09-13 ENCOUNTER — Ambulatory Visit (HOSPITAL_COMMUNITY)
Admission: EM | Admit: 2019-09-13 | Discharge: 2019-09-13 | Disposition: A | Discharge status: Home / Self Care | Payer: Medicaid Other | Attending: Family Medicine | Admitting: Family Medicine

## 2019-09-13 ENCOUNTER — Other Ambulatory Visit: Payer: Self-pay

## 2019-09-13 DIAGNOSIS — A64 Unspecified sexually transmitted disease: Secondary | ICD-10-CM

## 2019-09-13 LAB — CERVICOVAGINAL ANCILLARY ONLY
Bacterial Vaginitis (gardnerella): POSITIVE — AB
Candida Glabrata: NEGATIVE
Candida Vaginitis: NEGATIVE
Chlamydia: POSITIVE — AB
Comment: NEGATIVE
Comment: NEGATIVE
Comment: NEGATIVE
Comment: NEGATIVE
Comment: NEGATIVE
Comment: NORMAL
Neisseria Gonorrhea: POSITIVE — AB
Trichomonas: NEGATIVE

## 2019-09-13 MED ORDER — CEFTRIAXONE SODIUM 500 MG IJ SOLR
INTRAMUSCULAR | Status: AC
  Filled 2019-09-13: qty 500

## 2019-09-13 MED ORDER — DOXYCYCLINE HYCLATE 100 MG PO CAPS: 100.0000 mg | ORAL_CAPSULE | Freq: Two times a day (BID) | ORAL | 0 refills | Status: AC

## 2019-09-13 MED ORDER — CEFTRIAXONE SODIUM 500 MG IJ SOLR
500.0000 mg | Freq: Once | INTRAMUSCULAR | Status: AC
  Administered 2019-09-13: 19:00:00 500 mg via INTRAMUSCULAR

## 2019-09-13 NOTE — ED Triage Notes (Signed)
Pt is here needing Rocephin 500mg , nurse visit for STI's.

## 2019-11-20 ENCOUNTER — Encounter (HOSPITAL_COMMUNITY): Payer: Self-pay

## 2019-11-20 ENCOUNTER — Ambulatory Visit (HOSPITAL_COMMUNITY)
Admission: EM | Admit: 2019-11-20 | Discharge: 2019-11-20 | Disposition: A | Discharge status: Home / Self Care | Payer: Medicaid Other | Attending: Emergency Medicine | Admitting: Emergency Medicine

## 2019-11-20 ENCOUNTER — Ambulatory Visit (INDEPENDENT_AMBULATORY_CARE_PROVIDER_SITE_OTHER): Payer: Medicaid Other

## 2019-11-20 ENCOUNTER — Other Ambulatory Visit: Payer: Self-pay

## 2019-11-20 DIAGNOSIS — R600 Localized edema: Secondary | ICD-10-CM | POA: Diagnosis not present

## 2019-11-20 DIAGNOSIS — M7989 Other specified soft tissue disorders: Secondary | ICD-10-CM | POA: Diagnosis not present

## 2019-11-20 DIAGNOSIS — M25571 Pain in right ankle and joints of right foot: Secondary | ICD-10-CM

## 2019-11-20 DIAGNOSIS — S9031XA Contusion of right foot, initial encounter: Secondary | ICD-10-CM

## 2019-11-20 NOTE — ED Provider Notes (Signed)
[facility]    CSN: [id number] Arrival date & time: 11/20/19  1619      History   Chief Complaint Chief Complaint  Patient presents with  . Foot Pain    HPI Danielle Bradley is a 24 y.o. female presents to urgent care today after injury to her right foot.  Patient states she picked up a couch access outlet and currently dropped a couch on her foot patient reports pain in right great toe extending to area just below great toe.  Patient reports pain with ambulation, she denies any numbness or tingling to affected extremity.   Past Medical History:  Diagnosis Date  . 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 Indication for care in labor or delivery 08/23/2018  . Supervision of other normal pregnancy, antepartum 02/13/2018  . Subchorionic hematoma 02/06/2018  . 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 WISDOM TOOTH EXTRACTION      OB History    Gravida  2   Para  2   Term  2   Preterm      AB      Living  2     SAB      TAB      Ectopic      Multiple  0   Live Births  2            Home Medications    Prior to Admission medications   Medication Sig Start Date End Date Taking? Authorizing Provider  naproxen (NAPROSYN) 500 MG tablet Take 1 tablet (500 mg total) by mouth 2 (two) times daily. 09/12/19   Wieters, Hallie C, PA-C  Prenatal Vit-Fe Fumarate-FA (MULTIVITAMIN-PRENATAL) 27-0.8 MG TABS tablet Take 1 tablet by mouth daily at 12 noon.    [provider]    Family History Family History  Problem Relation Age of Onset  . Neurofibromatosis Mother   . Neurofibromatosis Sister   . Neurofibromatosis Maternal Grandmother     Social History Social History   Tobacco Use  . Smoking status: Never Smoker  . Smokeless tobacco: Never Used  Vaping Use  . Vaping Use: Never used  Substance Use  Topics  . Alcohol use: No    Alcohol/week: 0.0 standard drinks  . Drug use: No     Allergies   Patient has no known allergies.   Review of Systems As stated in HPI otherwise negative   Physical Exam Triage Vital Signs ED Triage Vitals [11/20/19 1719]  Enc Vitals Group     BP 115/72     Pulse Rate 85     Resp 18     Temp 98.4 F (36.9 C)     Temp Source Oral     SpO2 100 %     Weight      Height      Head Circumference      Peak Flow      Pain Score 9     Pain Loc      Pain Edu?      Excl. in [place]?    No data found.  Updated Vital Signs BP 115/72 (BP Location: Right Arm)   Pulse 85   Temp 98.4 F (36.9 C) (Oral)   Resp 18   LMP 11/20/2019 (Exact Date)   SpO2 100%   Physical Exam Constitutional:  Appearance: Normal appearance. She is normal weight.  Cardiovascular:     Pulses: Normal pulses.  Musculoskeletal:        General: Tenderness present. No swelling or deformity. Normal range of motion.     Comments: Tenderness upon palpation to right great toe and first metatarsal.  No obvious deformity  Skin:    General: Skin is warm and dry.     Capillary Refill: Capillary refill takes less than 2 seconds.  Neurological:     Mental Status: She is alert.      UC Treatments / Results  Labs (all labs ordered are listed, but only abnormal results are displayed) Labs Reviewed - No data to display  EKG   Radiology DG Foot Complete Right  Result Date: 11/20/2019 CLINICAL DATA:  Right foot pain after injury. Edema and limited range of motion. Trapped sofa on foot. EXAM: RIGHT FOOT COMPLETE - 3+ VIEW COMPARISON:  None. FINDINGS: There is no evidence of fracture or dislocation. There is no evidence of arthropathy or other focal bone abnormality. Mild dorsal soft tissue edema. IMPRESSION: Soft tissue edema. No fracture or subluxation. Electronically Signed   By: Narda Rutherford M.D.   On: 11/20/2019 17:54    Procedures Procedures (including critical care  time)  Medications Ordered in UC Medications - No data to display  Initial Impression / Assessment and Plan / UC Course  I have reviewed the triage vital signs and the nursing notes.  Pertinent labs & imaging results that were available during my care of the patient were reviewed by me and considered in my medical decision making (see chart for details).  Contusion, right foot -X-ray is negative for fracture -Ice, compression, NSAIDs as needed -Follow-up as needed  Final Clinical Impressions(s) / UC Diagnoses   Final diagnoses:  Contusion of right foot, initial encounter     Discharge Instructions     Your x-ray did not show any evidence of broken.  Use ice frequently for the next 2 days applying for 15 to 20 minutes at a time.  Motrin 600 mg every 8 hours as needed for pain    ED Prescriptions    None     PDMP not reviewed this encounter.   Rolla Etienne, NP 11/20/19 1932

## 2019-11-20 NOTE — ED Triage Notes (Signed)
Pt reports that she dropped a couch on her right foot and great toe while moving furniture this afternoon.  C/o pain to dorsal portion of foot towards great toe. Area edematous with limited ROM. Brisk cap refill, toes warm to touch, +2 DP pulse.  No NSAIDs/tylenol taken today.

## 2019-11-20 NOTE — Discharge Instructions (Addendum)
Your x-ray did not show any evidence of broken.  Use ice frequently for the next 2 days applying for 15 to 20 minutes at a time.  Motrin 600 mg every 8 hours as needed for pain

## 2020-03-13 IMAGING — US US OB COMP LESS 14 WK
1 series · 15 of 28 positions shown · non-contrast
Comparison: None.

CLINICAL DATA: Initial evaluation for acute vaginal spotting,
abdominal and back pain. Early pregnancy.

EXAM:
OBSTETRIC <14 WK US AND TRANSVAGINAL OB US
TECHNIQUE: Both transabdominal and transvaginal ultrasound examinations were
performed for complete evaluation of the gestation as well as the
maternal uterus, adnexal regions, and pelvic cul-de-sac.
Transvaginal technique was performed to assess early pregnancy.

[Series 1: us ob comp less 14 wk · 15 of 31 slices shown]
[im 1/31]
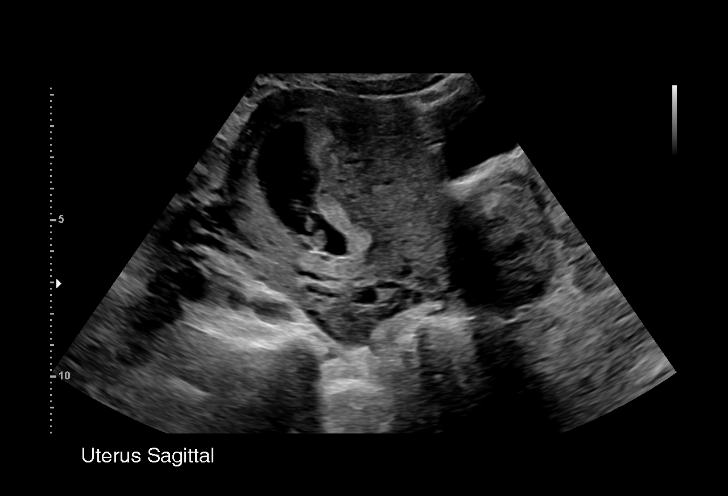
[im 3/31]
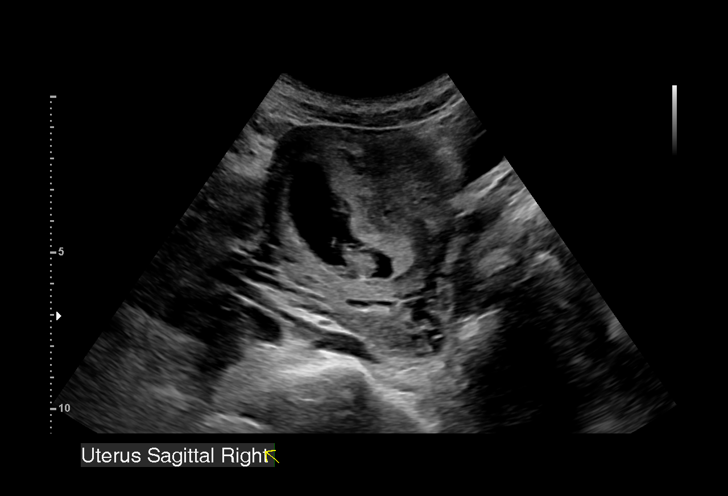
[im 5/31]
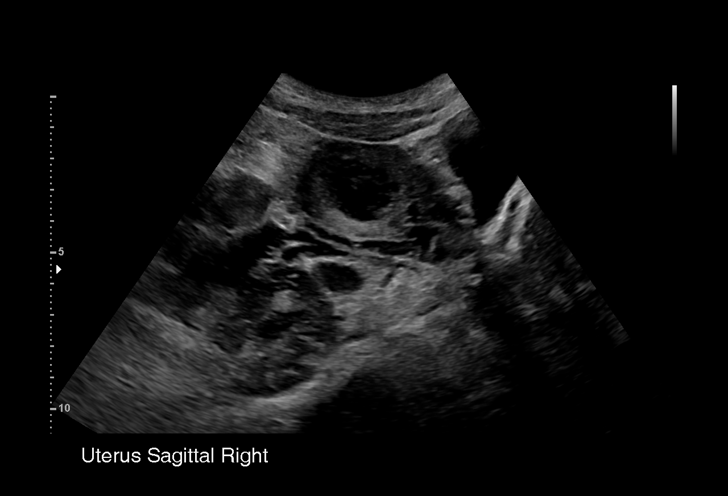
[im 7/31]
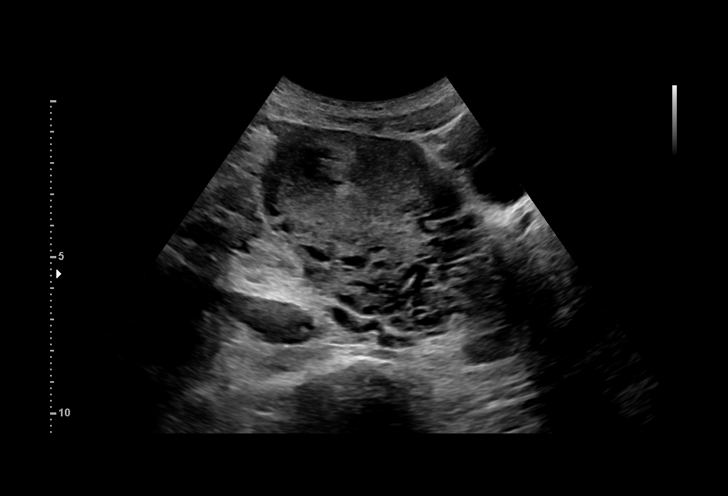
[im 9/31]
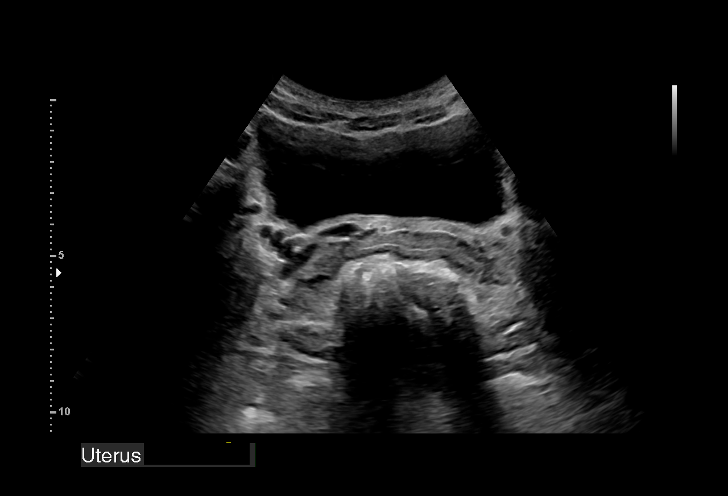
[im 12/31]
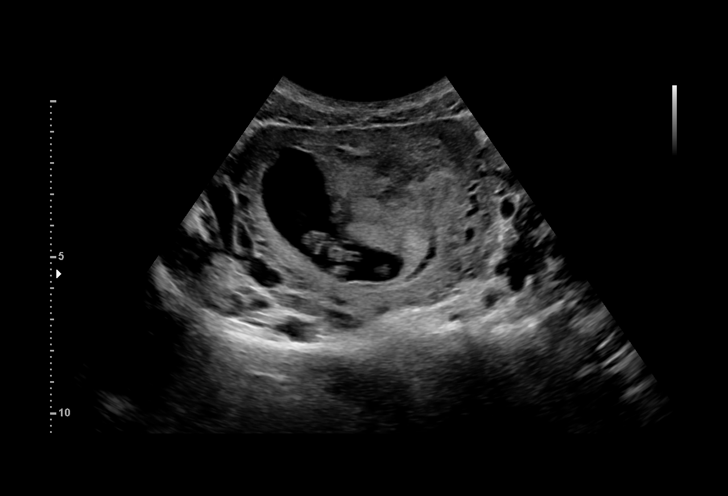
[im 14/31]
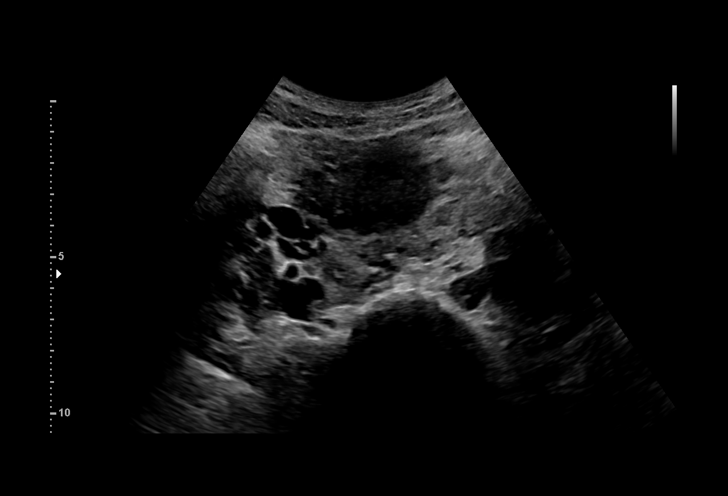
[im 16/31]
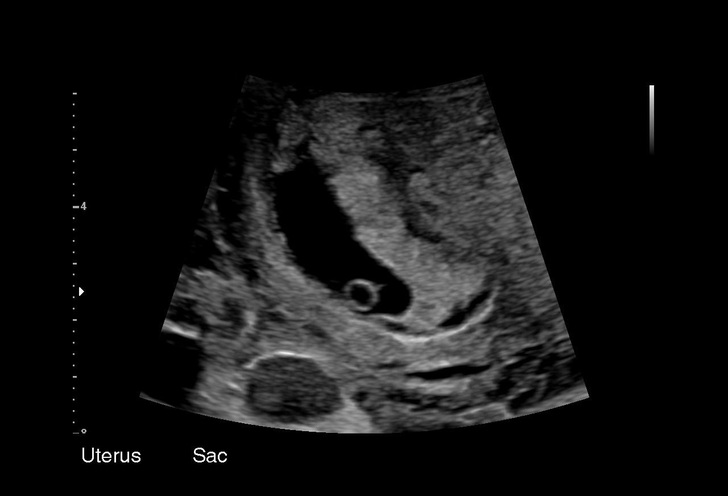
[im 17/31]
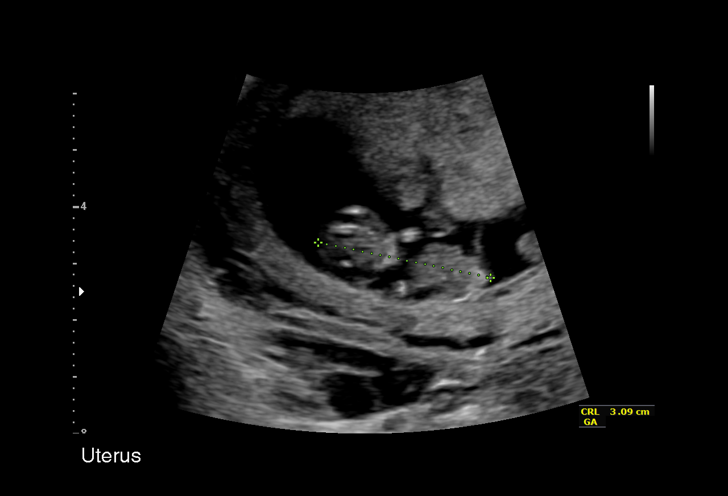
[im 19/31]
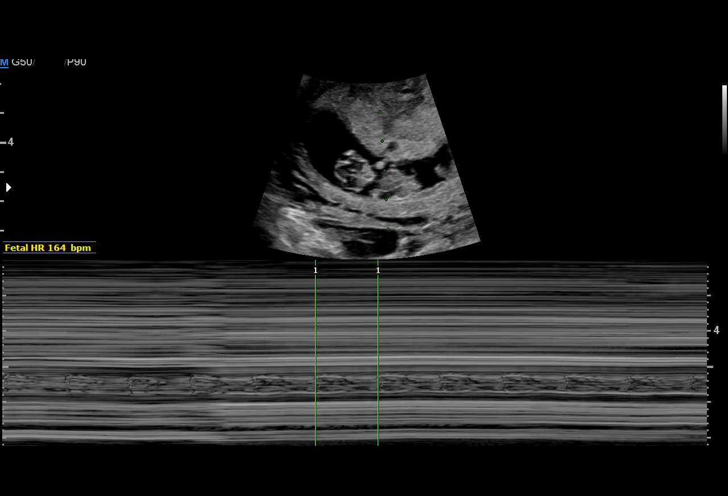
[im 22/31]
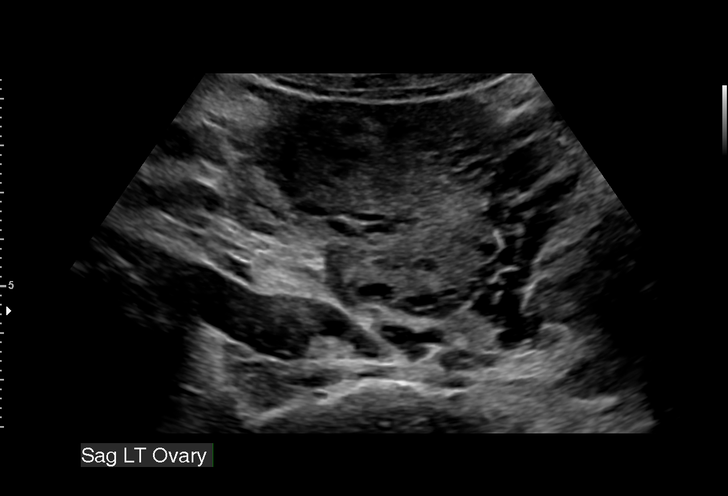
[im 24/31]
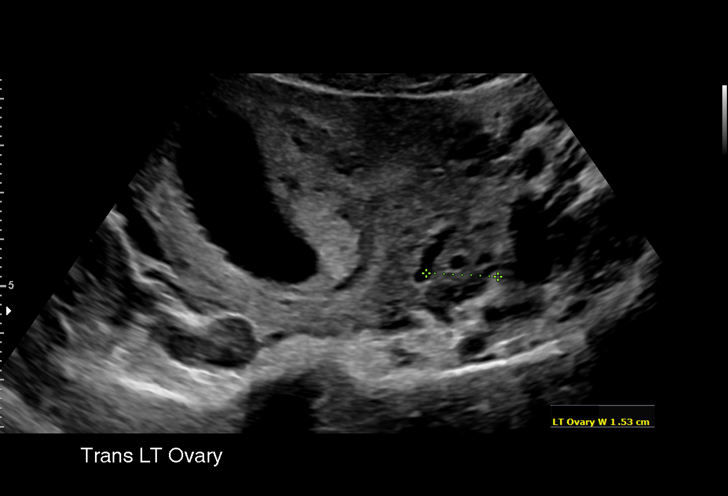
[im 26/31]
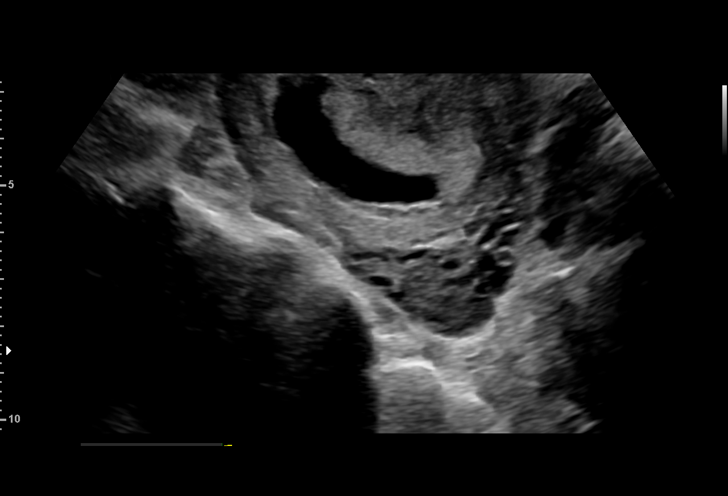
[im 28/31]
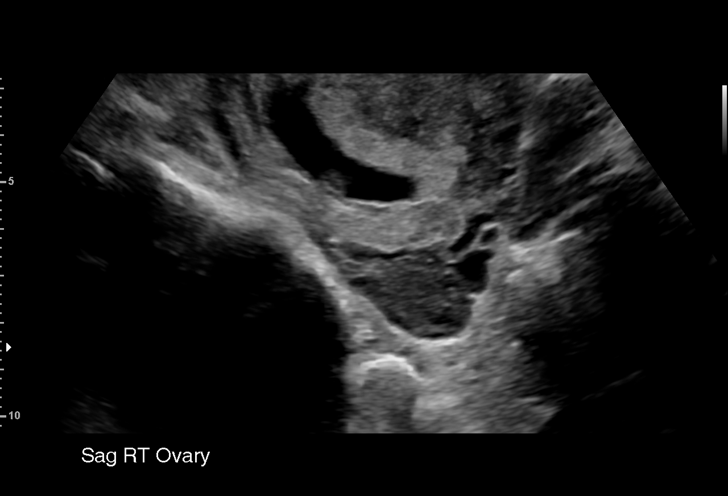
[im 31/31]
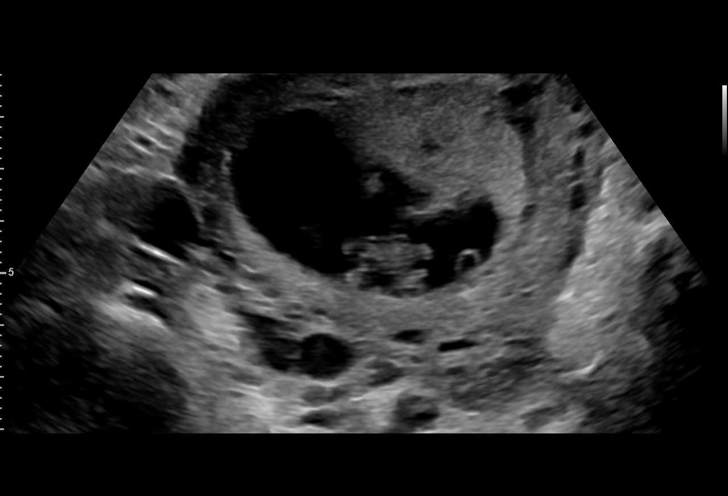

[15 of 28 positions shown; findings below may reference images not displayed]

FINDINGS: Intrauterine gestational sac: Single

Yolk sac:  Present

Embryo:  Present

Cardiac Activity: Present

Heart Rate: 164 bpm

CRL: 30.6 mm   9 w   6 d                  US EDC: 09/05/2018

Subchorionic hemorrhage: Small subchorionic hemorrhage without mass
effect.

Maternal uterus/adnexae: Ovaries are normal in appearance
bilaterally. No adnexal mass or free fluid.
IMPRESSION: 1. Single live intrauterine pregnancy as above, estimated
gestational age 9 weeks and 6 days by crown-rump length, with
ultrasound EDC of 09/05/2018.
2. Small subchorionic hemorrhage without mass effect.
3. No other acute maternal uterine or adnexal abnormality
identified.

## 2020-07-22 ENCOUNTER — Other Ambulatory Visit: Payer: Self-pay

## 2020-07-22 ENCOUNTER — Ambulatory Visit (HOSPITAL_COMMUNITY)
Admission: EM | Admit: 2020-07-22 | Discharge: 2020-07-22 | Disposition: A | Discharge status: Home / Self Care | Payer: Medicaid Other | Attending: Family Medicine | Admitting: Family Medicine

## 2020-07-22 ENCOUNTER — Ambulatory Visit (INDEPENDENT_AMBULATORY_CARE_PROVIDER_SITE_OTHER): Payer: Medicaid Other

## 2020-07-22 ENCOUNTER — Encounter (HOSPITAL_COMMUNITY): Payer: Self-pay

## 2020-07-22 DIAGNOSIS — S60221A Contusion of right hand, initial encounter: Secondary | ICD-10-CM

## 2020-07-22 DIAGNOSIS — Y9383 Activity, rough housing and horseplay: Secondary | ICD-10-CM

## 2020-07-22 DIAGNOSIS — S60222A Contusion of left hand, initial encounter: Secondary | ICD-10-CM | POA: Diagnosis not present

## 2020-07-22 DIAGNOSIS — M79642 Pain in left hand: Secondary | ICD-10-CM

## 2020-07-22 DIAGNOSIS — M79641 Pain in right hand: Secondary | ICD-10-CM

## 2020-07-22 DIAGNOSIS — M7989 Other specified soft tissue disorders: Secondary | ICD-10-CM | POA: Diagnosis not present

## 2020-07-22 MED ORDER — TIZANIDINE HCL 2 MG PO TABS: 2.0000 mg | ORAL_TABLET | Freq: Four times a day (QID) | ORAL | 0 refills | Status: DC | PRN

## 2020-07-22 MED ORDER — DICLOFENAC SODIUM 50 MG PO TBEC: 50.0000 mg | DELAYED_RELEASE_TABLET | Freq: Two times a day (BID) | ORAL | 0 refills | Status: DC | PRN

## 2020-07-22 NOTE — Discharge Instructions (Signed)
Your imaging of both hands were negative for fracture.  For hand swelling and bruising start Voltaren 50 mg twice daily as needed with food for inflammation and swelling.  For acute pain tizanidine 2 mg every 6 hours as needed.  Tizanidine can cause severe drowsiness therefore avoid taking when driving.

## 2020-07-22 NOTE — ED Triage Notes (Signed)
Pt c/o swelling and bruising on the right hand. Pt states she has soreness on the left hand. Pt states she was play fighting with her friend and injured both of her hands.

## 2020-08-18 ENCOUNTER — Encounter (HOSPITAL_COMMUNITY): Payer: Self-pay | Admitting: Emergency Medicine

## 2020-08-18 ENCOUNTER — Ambulatory Visit (HOSPITAL_COMMUNITY)
Admission: EM | Admit: 2020-08-18 | Discharge: 2020-08-18 | Disposition: A | Discharge status: Home / Self Care | Payer: Medicaid Other

## 2020-08-18 DIAGNOSIS — J029 Acute pharyngitis, unspecified: Secondary | ICD-10-CM

## 2020-08-18 NOTE — ED Triage Notes (Signed)
Pt presents with scratchy throat and swollen tonsils xs 4 days.

## 2020-08-18 NOTE — ED Provider Notes (Signed)
[facility]    CSN: [id number] Arrival date & time: 08/18/20  1459      History   Chief Complaint Chief Complaint  Patient presents with  . Sore Throat    HPI Danielle Bradley is a 25 y.o. female.   Patient presents with dry scratchy throat with painful swallowing, swollen tonsils ad rhinorrhea for 4 days. Feels " like lump in throat". intermittent generalized headaches and 2 episodes of soft diarrhea present starting last night.  Denies shortness of breath, difficulty swallowing, fever, chills, body aches, ear pain, eye pain, nausea, vomiting.    Past Medical History:  Diagnosis Date  . Neurofibromatosis, type 1 [facility])     Patient Active Problem List   Diagnosis Date Noted  . Indication for care in labor or delivery 08/23/2018  . Supervision of other normal pregnancy, antepartum 02/13/2018  . Subchorionic hematoma 02/06/2018  . 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 WISDOM TOOTH EXTRACTION      OB History    Gravida  2   Para  2   Term  2   Preterm      AB      Living  2     SAB      IAB      Ectopic      Multiple  0   Live Births  2            Home Medications    Prior to Admission medications   Medication Sig Start Date End Date Taking? Authorizing Provider  diclofenac (VOLTAREN) 50 MG EC tablet Take 1 tablet (50 mg total) by mouth 2 (two) times daily between meals as needed. 07/22/20   Bing Neighbors, FNP  naproxen (NAPROSYN) 500 MG tablet Take 1 tablet (500 mg total) by mouth 2 (two) times daily. 09/12/19   Wieters, Hallie C, PA-C  Prenatal Vit-Fe Fumarate-FA (MULTIVITAMIN-PRENATAL) 27-0.8 MG TABS tablet Take 1 tablet by mouth daily at 12 noon.    [provider]  tiZANidine (ZANAFLEX) 2 MG tablet Take 1 tablet (2 mg total) by mouth every 6 (six) hours as needed for muscle spasms. 07/22/20   Bing Neighbors, FNP    Family  History Family History  Problem Relation Age of Onset  . Neurofibromatosis Mother   . Neurofibromatosis Sister   . Neurofibromatosis Maternal Grandmother     Social History Social History   Tobacco Use  . Smoking status: Never Smoker  . Smokeless tobacco: Never Used  Vaping Use  . Vaping Use: Never used  Substance Use Topics  . Alcohol use: No    Alcohol/week: 0.0 standard drinks  . Drug use: No     Allergies   Patient has no known allergies.   Review of Systems Review of Systems  Constitutional: Negative.   HENT: Positive for rhinorrhea and sore throat. Negative for congestion, dental problem, drooling, ear discharge, ear pain, facial swelling, hearing loss, mouth sores, nosebleeds, postnasal drip, sinus pressure, sinus pain, sneezing, tinnitus, trouble swallowing and voice change.   Respiratory: Negative.   Cardiovascular: Negative.   Gastrointestinal: Positive for diarrhea. Negative for abdominal distention, abdominal pain, anal bleeding, blood in stool, constipation, nausea, rectal pain and vomiting.  Skin: Negative.   Neurological: Positive for headaches. Negative for dizziness, tremors, seizures, syncope, facial asymmetry, speech difficulty, weakness, light-headedness and numbness.     Physical Exam Triage Vital Signs ED Triage Vitals  Enc Vitals Group     BP 08/18/20 1551 116/63     Pulse Rate 08/18/20 1551 72     Resp 08/18/20 1551 16     Temp 08/18/20 1551 98.1 F (36.7 C)     Temp Source 08/18/20 1551 Oral     SpO2 08/18/20 1551 100 %     Weight --      Height --      Head Circumference --      Peak Flow --      Pain Score 08/18/20 1549 3     Pain Loc --      Pain Edu? --      Excl. in GC? --    No data found.  Updated Vital Signs BP 116/63 (BP Location: Right Arm)   Pulse 72   Temp 98.1 F (36.7 C) (Oral)   Resp 16   LMP 08/03/2020   SpO2 100%   Visual Acuity Right Eye Distance:   Left Eye Distance:   Bilateral Distance:    Right  Eye Near:   Left Eye Near:    Bilateral Near:     Physical Exam Constitutional:      Appearance: She is well-developed and normal weight.  HENT:     Head: Normocephalic.     Right Ear: Tympanic membrane and ear canal normal.     Left Ear: Tympanic membrane and ear canal normal.     Nose: Rhinorrhea present. No congestion.     Mouth/Throat:     Mouth: Mucous membranes are moist.     Pharynx: Posterior oropharyngeal erythema present. No oropharyngeal exudate.     Tonsils: No tonsillar exudate or tonsillar abscesses. 1+ on the right. 1+ on the left.  Eyes:     Conjunctiva/sclera: Conjunctivae normal.     Pupils: Pupils are equal, round, and reactive to light.  Cardiovascular:     Rate and Rhythm: Normal rate and regular rhythm.     Heart sounds: Normal heart sounds.  Pulmonary:     Effort: Pulmonary effort is normal.     Breath sounds: Normal breath sounds.  Abdominal:     General: Bowel sounds are normal.     Palpations: Abdomen is soft.     Tenderness: There is no abdominal tenderness.  Musculoskeletal:     Cervical back: Normal range of motion and neck supple.  Skin:    General: Skin is warm and dry.  Neurological:     General: No focal deficit present.     Mental Status: She is alert and oriented to person, place, and time.  Psychiatric:        Mood and Affect: Mood normal.        Behavior: Behavior normal.      UC Treatments / Results  Labs (all labs ordered are listed, but only abnormal results are displayed) Labs Reviewed - No data to display  EKG   Radiology No results found.  Procedures Procedures (including critical care time)  Medications Ordered in UC Medications - No data to display  Initial Impression / Assessment and Plan / UC Course  I have reviewed the triage vital signs and the nursing notes.  Pertinent labs & imaging results that were available during my care of the patient were reviewed by me and considered in my medical decision making  (see chart for details).  Viral pharyngitis  1. Declined covid and flu testing 2. Salt water gargles, throat lozenges, chloraseptic spray as needed Final Clinical Impressions(s) /  UC Diagnoses   Final diagnoses:  Viral pharyngitis     Discharge Instructions     Can use throat lozenges, chloraseptic spray, salt water gargles, hot or cold liquids as needed    ED Prescriptions    None     PDMP not reviewed this encounter.   Valinda Hoar, NP 08/18/20 1616

## 2020-08-18 NOTE — Discharge Instructions (Addendum)
Can use throat lozenges, chloraseptic spray, salt water gargles, hot or cold liquids as needed

## 2020-12-26 ENCOUNTER — Encounter (HOSPITAL_COMMUNITY): Payer: Self-pay

## 2020-12-26 ENCOUNTER — Ambulatory Visit (HOSPITAL_COMMUNITY)
Admission: EM | Admit: 2020-12-26 | Discharge: 2020-12-26 | Disposition: A | Discharge status: Home / Self Care | Payer: Medicaid Other | Attending: Internal Medicine | Admitting: Internal Medicine

## 2020-12-26 ENCOUNTER — Other Ambulatory Visit: Payer: Self-pay

## 2020-12-26 DIAGNOSIS — R197 Diarrhea, unspecified: Secondary | ICD-10-CM | POA: Diagnosis not present

## 2020-12-26 DIAGNOSIS — R1030 Lower abdominal pain, unspecified: Secondary | ICD-10-CM

## 2020-12-26 MED ORDER — ONDANSETRON 4 MG PO TBDP: 4.0000 mg | ORAL_TABLET | Freq: Three times a day (TID) | ORAL | 0 refills | Status: DC | PRN

## 2020-12-26 MED ORDER — LACTINEX PO CHEW: 1.0000 | CHEWABLE_TABLET | Freq: Three times a day (TID) | ORAL | 0 refills | Status: AC

## 2020-12-26 NOTE — Discharge Instructions (Addendum)
Increase oral fluid intake Take medications as tolerated Return to urgent care if diarrhea persist for more than 7 days

## 2020-12-26 NOTE — ED Triage Notes (Signed)
Pt presents with stomach pain, emesis, nausea and diarrhea X 4 days. States she might have eaten the wrong foods.

## 2020-12-29 NOTE — ED Provider Notes (Signed)
[facility]    CSN: [id number] Arrival date & time: 12/26/20  1600      History   Chief Complaint Chief Complaint  Patient presents with   Abdominal Pain   Emesis   Diarrhea    HPI Danielle Bradley is a 25 y.o. female comes to the urgent care with 4-day history of generalized abdominal pain, nausea, vomiting and nonbloody nonmucoid diarrhea.  Symptoms started insidiously and has been persistent.  No fever or chills.  No dysuria urgency frequency.  Patient denies any significant changes in her diet.  No sick contacts.  No generalized body aches.  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 Authorizing Provider  lactobacillus acidophilus & bulgar (LACTINEX) chewable tablet Chew 1 tablet by mouth 3 (three) times daily with meals for 10 days. 12/26/20 01/05/21 Yes Hakeen Shipes, Britta Mccreedy, MD  ondansetron (ZOFRAN ODT) 4 MG disintegrating tablet Take 1 tablet (4 mg total) by mouth every 8 (eight) hours as needed for nausea or vomiting. 12/26/20  Yes Vaughan Garfinkle, Britta Mccreedy, MD  diclofenac (VOLTAREN) 50 MG EC tablet Take 1 tablet (50 mg total) by mouth 2 (two) times daily between meals as needed. 07/22/20   Bing Neighbors, FNP  naproxen (NAPROSYN) 500 MG tablet Take 1  tablet (500 mg total) by mouth 2 (two) times daily. 09/12/19   Wieters, Hallie C, PA-C  Prenatal Vit-Fe Fumarate-FA (MULTIVITAMIN-PRENATAL) 27-0.8 MG TABS tablet Take 1 tablet by mouth daily at 12 noon.    [provider]  tiZANidine (ZANAFLEX) 2 MG tablet Take 1 tablet (2 mg total) by mouth every 6 (six) hours as needed for muscle spasms. 07/22/20   Bing Neighbors, FNP    Family History Family History  Problem Relation Age of Onset   Neurofibromatosis Mother    Neurofibromatosis Sister    Neurofibromatosis Maternal Grandmother     Social History Social History   Tobacco Use   Smoking status: Never   Smokeless tobacco: Never  Vaping Use   Vaping Use: Never used  Substance Use Topics   Alcohol use: No    Alcohol/week: 0.0 standard drinks   Drug use: No     Allergies   Patient has no known allergies.   Review of Systems Review of Systems  Constitutional: Negative.   Gastrointestinal:  Positive for abdominal pain, diarrhea, nausea and vomiting.  Genitourinary: Negative.   Neurological: Negative.     Physical Exam Triage Vital Signs ED Triage Vitals  Enc Vitals Group     BP 12/26/20 1722 124/65     Pulse Rate 12/26/20 1722 70     Resp 12/26/20 1722 19     Temp 12/26/20 1722 99.1 F (37.3 C)     Temp Source 12/26/20 1722 Oral     SpO2 12/26/20 1722 100 %     Weight --      Height --      Head Circumference --      Peak Flow --      Pain Score 12/26/20 1723 8     Pain Loc --      Pain Edu? --      Excl. in GC? --    No data found.  Updated Vital Signs BP 124/65 (BP Location: Left Arm)   Pulse 70   Temp 99.1 F (37.3 C) (Oral)   Resp 19   LMP 12/04/2020 (Exact Date)   SpO2 100%   Visual Acuity Right Eye Distance:   Left Eye Distance:   Bilateral Distance:    Right Eye Near:   Left Eye Near:    Bilateral Near:     Physical Exam Vitals and nursing note reviewed.  Constitutional:      General: She is not in acute distress.     Appearance: She is not ill-appearing.  Cardiovascular:     Rate and Rhythm: Normal rate and regular rhythm.     Heart sounds: Normal heart sounds.  Pulmonary:     Effort: Pulmonary effort is normal.     Breath sounds: Normal breath sounds.  Abdominal:     General: Bowel sounds are normal.     Palpations: Abdomen is soft. There is no hepatomegaly or splenomegaly.     Tenderness: There is no abdominal tenderness.  Neurological:     Mental Status: She is alert.     UC Treatments / Results  Labs (all labs ordered are listed, but only abnormal results are displayed) Labs Reviewed - No data to display  EKG   Radiology No results found.  Procedures Procedures (including critical care time)  Medications Ordered in UC Medications - No data to display  Initial Impression / Assessment and Plan / UC Course  I have reviewed the triage vital signs and the nursing notes.  Pertinent labs & imaging results that were available during my care of the patient were reviewed by me and considered in my medical decision making (see chart for details).     1.  Gastroenteritis: Supportive care Increase oral fluid intake Zofran as needed for nausea/vomiting Probiotics Return precautions given Stool testing is not indicated at this time. Final Clinical Impressions(s) / UC Diagnoses   Final diagnoses:  Lower abdominal pain  Diarrhea, unspecified type     Discharge Instructions      Increase oral fluid intake Take medications as tolerated Return to urgent care if diarrhea persist for more than 7 days   ED Prescriptions     Medication Sig Dispense Auth. Provider   ondansetron (ZOFRAN ODT) 4 MG disintegrating tablet Take 1 tablet (4 mg total) by mouth every 8 (eight) hours as needed for nausea or vomiting. 20 tablet Dorleen Kissel, Britta Mccreedy, MD   lactobacillus acidophilus & bulgar (LACTINEX) chewable tablet Chew 1 tablet by mouth 3 (three) times daily with meals for 10 days. 30 tablet  Epic Tribbett, Britta Mccreedy, MD      PDMP not reviewed this encounter.   Merrilee Jansky, MD 12/29/20 1245

## 2021-03-20 ENCOUNTER — Other Ambulatory Visit: Payer: Self-pay

## 2021-03-20 ENCOUNTER — Ambulatory Visit (HOSPITAL_COMMUNITY)
Admission: EM | Admit: 2021-03-20 | Discharge: 2021-03-20 | Disposition: A | Discharge status: Home / Self Care | Payer: Medicaid Other | Attending: Family Medicine | Admitting: Family Medicine

## 2021-03-20 ENCOUNTER — Encounter (HOSPITAL_COMMUNITY): Payer: Self-pay | Admitting: Emergency Medicine

## 2021-03-20 DIAGNOSIS — J988 Other specified respiratory disorders: Secondary | ICD-10-CM | POA: Insufficient documentation

## 2021-03-20 DIAGNOSIS — Z20822 Contact with and (suspected) exposure to covid-19: Secondary | ICD-10-CM | POA: Diagnosis not present

## 2021-03-20 DIAGNOSIS — B9789 Other viral agents as the cause of diseases classified elsewhere: Secondary | ICD-10-CM | POA: Insufficient documentation

## 2021-03-20 LAB — POC INFLUENZA A AND B ANTIGEN (URGENT CARE ONLY)
INFLUENZA A ANTIGEN, POC: NEGATIVE
INFLUENZA B ANTIGEN, POC: NEGATIVE

## 2021-03-20 LAB — SARS CORONAVIRUS 2 (TAT 6-24 HRS): SARS Coronavirus 2: NEGATIVE

## 2021-03-20 MED ORDER — PROMETHAZINE-DM 6.25-15 MG/5ML PO SYRP: 5.0000 mL | ORAL_SOLUTION | Freq: Four times a day (QID) | ORAL | 0 refills | Status: DC | PRN

## 2021-03-20 NOTE — ED Provider Notes (Signed)
[facility]    CSN: [id number] Arrival date & time: 03/20/21  0806      History   Chief Complaint Chief Complaint  Patient presents with   Nasal Congestion   Generalized Body Aches    HPI Danielle Bradley is a 25 y.o. female.   HPI Patient presents today for evaluation of nasal congestion, generalized body aches and sore throat. Patient has had a known exposure to influenza.  She has been taking over-the-counter NyQuil for management of symptoms.  Denies any known fever.  Denies any shortness of breath, nausea or vomiting.    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 Authorizing Provider  promethazine-dextromethorphan (PROMETHAZINE-DM) 6.25-15 MG/5ML syrup Take 5 mLs by mouth 4 (four) times daily as needed for cough. 03/20/21  Yes Bing Neighbors, FNP  diclofenac (VOLTAREN) 50 MG EC tablet Take 1 tablet (50 mg total) by mouth 2 (two) times daily between meals as needed. 07/22/20   Bing Neighbors, FNP  naproxen (NAPROSYN) 500 MG tablet Take 1 tablet (500 mg total) by mouth 2 (two) times daily. 09/12/19   Wieters, Hallie C, PA-C  ondansetron (ZOFRAN ODT) 4 MG disintegrating tablet Take 1 tablet (4 mg total) by mouth every 8 (eight) hours as needed for nausea or vomiting. 12/26/20    Lamptey, Britta Mccreedy, MD  Prenatal Vit-Fe Fumarate-FA (MULTIVITAMIN-PRENATAL) 27-0.8 MG TABS tablet Take 1 tablet by mouth daily at 12 noon.    [provider]  tiZANidine (ZANAFLEX) 2 MG tablet Take 1 tablet (2 mg total) by mouth every 6 (six) hours as needed for muscle spasms. 07/22/20   Bing Neighbors, FNP    Family History Family History  Problem Relation Age of Onset   Neurofibromatosis Mother    Neurofibromatosis Sister    Neurofibromatosis Maternal Grandmother     Social History Social History   Tobacco Use   Smoking status: Never   Smokeless tobacco: Never  Vaping Use   Vaping Use: Never used  Substance Use Topics   Alcohol use: No    Alcohol/week: 0.0 standard drinks   Drug use: No     Allergies   Patient has no known allergies.   Review of Systems Review of Systems Pertinent negatives listed in HPI  Physical Exam Triage Vital Signs ED Triage Vitals  Enc Vitals Group     BP 03/20/21 0837 122/74     Pulse Rate 03/20/21 0837 88     Resp 03/20/21 0837 12     Temp 03/20/21  0837 98.2 F (36.8 C)     Temp Source 03/20/21 0837 Oral     SpO2 03/20/21 0837 100 %     Weight --      Height --      Head Circumference --      Peak Flow --      Pain Score 03/20/21 0838 8     Pain Loc --      Pain Edu? --      Excl. in GC? --    No data found.  Updated Vital Signs BP 122/74 (BP Location: Left Arm)   Pulse 88   Temp 98.2 F (36.8 C) (Oral)   Resp 12   LMP 03/11/2021 (Exact Date)   SpO2 100%   Visual Acuity Right Eye Distance:   Left Eye Distance:   Bilateral Distance:    Right Eye Near:   Left Eye Near:    Bilateral Near:     Physical Exam Constitutional:      Comments: Acutely ill-appearing  HENT:     Head: Normocephalic.     Right Ear: Tympanic membrane normal.     Left Ear: Tympanic membrane normal.     Nose: Congestion present.     Mouth/Throat:     Pharynx: No oropharyngeal exudate or posterior oropharyngeal erythema.  Eyes:      Extraocular Movements: Extraocular movements intact.     Pupils: Pupils are equal, round, and reactive to light.  Cardiovascular:     Rate and Rhythm: Normal rate and regular rhythm.  Pulmonary:     Effort: Pulmonary effort is normal.     Breath sounds: Normal breath sounds.  Skin:    General: Skin is warm.     Capillary Refill: Capillary refill takes less than 2 seconds.  Neurological:     General: No focal deficit present.     Mental Status: She is alert.  Psychiatric:        Mood and Affect: Mood normal.        Behavior: Behavior normal.        Thought Content: Thought content normal.        Judgment: Judgment normal.     UC Treatments / Results  Labs (all labs ordered are listed, but only abnormal results are displayed) Labs Reviewed  SARS CORONAVIRUS 2 (TAT 6-24 HRS)  POC INFLUENZA A AND B ANTIGEN (URGENT CARE ONLY)    EKG   Radiology No results found.  Procedures Procedures (including critical care time)  Medications Ordered in UC Medications - No data to display  Initial Impression / Assessment and Plan / UC Course  I have reviewed the triage vital signs and the nursing notes.  Pertinent labs & imaging results that were available during my care of the patient were reviewed by me and considered in my medical decision making (see chart for details).    Viral respiratory illness rapid flu negative.  COVID testing pending COVID/Flu test pending. Symptom management warranted only.  Manage fever with Tylenol and ibuprofen.  Nasal symptoms with over-the-counter antihistamines recommended.  Treatment per discharge medications/discharge instructions.  Red flags/ER precautions given. The most current CDC isolation/quarantine recommendation advised.    Final Clinical Impressions(s) / UC Diagnoses   Final diagnoses:  Encounter for laboratory testing for COVID-19 virus  Viral respiratory illness   Discharge Instructions   None    ED Prescriptions      Medication Sig Dispense Auth. Provider   promethazine-dextromethorphan (PROMETHAZINE-DM) 6.25-15 MG/5ML syrup Take 5  mLs by mouth 4 (four) times daily as needed for cough. 118 mL Bing Neighbors, FNP      PDMP not reviewed this encounter.   Bing Neighbors, FNP 03/20/21 [phone]

## 2021-03-20 NOTE — ED Triage Notes (Addendum)
Patient c/o nasal congestion and generalized body aches x 3 days.   Patient endorses "my throat hurts when I'm coughing".   Patient endorses headache.   Patient endorses being exposed to the Flu.   Patient denies fever at home.   Patient has taken Dayquil and Nyquil with no relief of symptoms.

## 2021-05-04 ENCOUNTER — Encounter (HOSPITAL_COMMUNITY): Payer: Self-pay

## 2021-05-04 ENCOUNTER — Ambulatory Visit (INDEPENDENT_AMBULATORY_CARE_PROVIDER_SITE_OTHER): Payer: Medicaid Other

## 2021-05-04 ENCOUNTER — Ambulatory Visit (HOSPITAL_COMMUNITY)
Admission: EM | Admit: 2021-05-04 | Discharge: 2021-05-04 | Disposition: A | Discharge status: Home / Self Care | Payer: Medicaid Other | Attending: Student | Admitting: Student

## 2021-05-04 ENCOUNTER — Other Ambulatory Visit: Payer: Self-pay

## 2021-05-04 DIAGNOSIS — S20212A Contusion of left front wall of thorax, initial encounter: Secondary | ICD-10-CM

## 2021-05-04 DIAGNOSIS — R079 Chest pain, unspecified: Secondary | ICD-10-CM | POA: Diagnosis not present

## 2021-05-04 MED ORDER — TIZANIDINE HCL 2 MG PO TABS: 2.0000 mg | ORAL_TABLET | Freq: Three times a day (TID) | ORAL | 0 refills | Status: DC | PRN

## 2021-05-04 NOTE — Discharge Instructions (Addendum)
-  Start the muscle relaxer-Zanaflex (tizanidine), up to 3 times daily for muscle spasms and pain.  This can make you drowsy, so take at bedtime or when you do not need to drive or operate machinery. -You can take Tylenol up to 1000 mg 3 times daily, and ibuprofen up to 800 mg 3 times daily with food.  You can take these together, or alternate every 3-4 hours. -Rest, heating pad
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
## 2021-09-03 ENCOUNTER — Other Ambulatory Visit: Payer: Self-pay

## 2021-09-03 ENCOUNTER — Ambulatory Visit (HOSPITAL_COMMUNITY)
Admission: EM | Admit: 2021-09-03 | Discharge: 2021-09-03 | Disposition: A | Discharge status: Home / Self Care | Payer: Medicaid Other | Attending: Family Medicine | Admitting: Family Medicine

## 2021-09-03 ENCOUNTER — Encounter (HOSPITAL_COMMUNITY): Payer: Self-pay | Admitting: *Deleted

## 2021-09-03 DIAGNOSIS — N390 Urinary tract infection, site not specified: Secondary | ICD-10-CM | POA: Diagnosis not present

## 2021-09-03 DIAGNOSIS — R109 Unspecified abdominal pain: Secondary | ICD-10-CM

## 2021-09-03 LAB — POCT URINALYSIS DIPSTICK, ED / UC
Bilirubin Urine: NEGATIVE
Glucose, UA: NEGATIVE mg/dL
Hgb urine dipstick: NEGATIVE
Ketones, ur: NEGATIVE mg/dL
Nitrite: NEGATIVE
Protein, ur: NEGATIVE mg/dL
Specific Gravity, Urine: 1.02 (ref 1.005–1.030)
Urobilinogen, UA: 0.2 mg/dL (ref 0.0–1.0)
pH: 6.5 (ref 5.0–8.0)

## 2021-09-03 LAB — POC URINE PREG, ED: Preg Test, Ur: NEGATIVE

## 2021-09-03 MED ORDER — SULFAMETHOXAZOLE-TRIMETHOPRIM 800-160 MG PO TABS: 1.0000 | ORAL_TABLET | Freq: Two times a day (BID) | ORAL | 0 refills | Status: AC

## 2021-09-03 NOTE — ED Provider Notes (Signed)
?[facility] ? ? ? ?CSN: [id number] ?Arrival date & time: 09/03/21  0806 ? ? ?  ? ?History   ?Chief Complaint ?Chief Complaint  ?Patient presents with  ? Flank Pain  ? ? ?HPI ?Danielle Bradley is a 26 y.o. female.  ? ?Patient is here for right flank pain, that started about 2-3 days ago, along with strong odor to the urine. Pain is at the right lateral abdomen.   She was drinking more water so urinating more.  No pain with urination.  ?Took otc azo and helped with the odor, but still with flank pain.  ?No fevers/chills.  ?+ slight abd pain.  ?No vaginal d/c.  ?No n/v.  ?No history of recurrent infections.  ? ? ?Past Medical History:  ?Diagnosis Date  ? Neurofibromatosis, type 1 (HCC)   ? ? ?Patient Active Problem List  ? Diagnosis Date Noted  ? Indication for care in labor or delivery 08/23/2018  ? Supervision of other normal pregnancy, antepartum 02/13/2018  ? Subchorionic hematoma 02/06/2018  ? 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 ? ?Past Surgical History:  ?Procedure Laterality Date  ? WISDOM TOOTH EXTRACTION    ? ? ?OB History   ? ? Gravida  ?2  ? Para  ?2  ? Term  ?2  ? Preterm  ?   ? AB  ?   ? Living  ?2  ?  ? ? SAB  ?   ? IAB  ?   ? Ectopic  ?   ? Multiple  ?0  ? Live Births  ?2  ?   ?  ?  ? ? ? ?Home Medications   ? ?Prior to Admission medications   ?Medication Sig Start Date End Date Taking? Authorizing Provider  ?diclofenac (VOLTAREN) 50 MG EC tablet Take 1 tablet (50 mg total) by mouth 2 (two) times daily between meals as needed. 07/22/20   Bing Neighbors, FNP  ?naproxen (NAPROSYN) 500 MG tablet Take 1 tablet (500 mg total) by mouth 2 (two) times daily. 09/12/19   Wieters, Hallie C, PA-C  ?ondansetron (ZOFRAN ODT) 4 MG disintegrating tablet Take 1 tablet (4 mg total) by mouth every 8 (eight) hours as needed for nausea or vomiting. 12/26/20   Lamptey, Britta Mccreedy, MD  ?Prenatal Vit-Fe Fumarate-FA (MULTIVITAMIN-PRENATAL) 27-0.8 MG TABS tablet Take 1 tablet by  mouth daily at 12 noon.    [provider]  ?promethazine-dextromethorphan (PROMETHAZINE-DM) 6.25-15 MG/5ML syrup Take 5 mLs by mouth 4 (four) times daily as needed for cough. 03/20/21   Bing Neighbors, FNP  ?tiZANidine (ZANAFLEX) 2 MG tablet Take 1 tablet (2 mg total) by mouth every 8 (eight) hours as needed for muscle spasms. 05/04/21   Rhys Martini, PA-C  ? ? ?Family History ?Family History  ?Problem Relation Age of Onset  ? Neurofibromatosis Mother   ? Neurofibromatosis Sister   ? Neurofibromatosis Maternal Grandmother   ? ? ?Social History ?Social History  ? ?Tobacco Use  ? Smoking status: Never  ? Smokeless tobacco: Never  ?Vaping Use  ? Vaping Use: Never used  ?Substance Use Topics  ? Alcohol use: No  ?  Alcohol/week: 0.0 standard drinks  ? Drug use: No  ? ? ? ?Allergies   ?Patient has no known allergies. ? ? ?Review of Systems ?Review of Systems  ?Constitutional: Negative.   ?HENT: Negative.    ?Respiratory: Negative.    ?Cardiovascular: Negative.   ?Gastrointestinal: Negative.   ?Genitourinary:  Positive for flank pain. Negative for frequency, urgency and vaginal discharge.  ? ? ?Physical Exam ?Triage Vital Signs ?ED Triage Vitals  ?Enc Vitals Group  ?   BP 09/03/21 0841 122/82  ?   Pulse Rate 09/03/21 0841 77  ?   Resp 09/03/21 0841 16  ?   Temp 09/03/21 0841 98.2 ?F (36.8 ?C)  ?   Temp src --   ?   SpO2 09/03/21 0841 98 %  ?   Weight --   ?   Height --   ?   Head Circumference --   ?   Peak Flow --   ?   Pain Score 09/03/21 0839 1  ?   Pain Loc --   ?   Pain Edu? --   ?   Excl. in GC? --   ? ?No data found. ? ?Updated Vital Signs ?BP 122/82   Pulse 77   Temp 98.2 ?F (36.8 ?C)   Resp 16   LMP 08/11/2021   SpO2 98%  ? ?Visual Acuity ?Right Eye Distance:   ?Left Eye Distance:   ?Bilateral Distance:   ? ?Right Eye Near:   ?Left Eye Near:    ?Bilateral Near:    ? ?Physical Exam ?Constitutional:   ?   Appearance: Normal appearance.  ?HENT:  ?   Head: Normocephalic and atraumatic.   ?Cardiovascular:  ?   Rate and Rhythm: Normal rate and regular rhythm.  ?Pulmonary:  ?   Effort: Pulmonary effort is normal.  ?   Breath sounds: Normal breath sounds.  ?Abdominal:  ?   Palpations: Abdomen is soft.  ?   Comments: Very slight tenderness suprapubically;  no CVA tenderness noted  ?Musculoskeletal:  ?   Cervical back: Normal range of motion.  ?Skin: ?   General: Skin is warm.  ?Neurological:  ?   General: No focal deficit present.  ?   Mental Status: She is alert.  ?Psychiatric:     ?   Mood and Affect: Mood normal.  ? ? ? ?UC Treatments / Results  ?Labs ?(all labs ordered are listed, but only abnormal results are displayed) ?Labs Reviewed  ?POCT URINALYSIS DIPSTICK, ED / UC - Abnormal; Notable for the following components:  ?    Result Value  ? Leukocytes,Ua TRACE (*)   ? All other components within normal limits  ?POC URINE PREG, ED  ? ? ?EKG ? ? ?Radiology ?No results found. ? ?Procedures ?Procedures (including critical care time) ? ?Medications Ordered in UC ?Medications - No data to display ? ?Initial Impression / Assessment and Plan / UC Course  ?I have reviewed the triage vital signs and the nursing notes. ? ?Pertinent labs & imaging results that were available during my care of the patient were reviewed by me and considered in my medical decision making (see chart for details). ? ?  ?Final Clinical Impressions(s) / UC Diagnoses  ? ?Final diagnoses:  ?Abdominal pain, unspecified abdominal location  ?Urinary tract infection without hematuria, site unspecified  ?Right flank pain  ? ? ? ?Discharge Instructions   ? ?  ?You were seen today for abdominal pain and urinary symptoms.  Your urine shows possible infection.  I have sent out an antibiotic to your pharmacy, and will culture the urine. We will call you with results if any changes need to be made.  ?Please continue to increase fluid intake.  ?If you have worsening symptoms, or symptoms do not improve, then please return for further  evaluation.   ? ? ? ?ED Prescriptions   ? ? Medication Sig Dispense Auth. Provider  ? sulfamethoxazole-trimethoprim (BACTRIM DS) 800-160 MG tablet Take 1 tablet by mouth 2 (two) times daily for 7 days. 14 tablet Jannifer FranklinPiontek, Lan Entsminger, MD  ? ?  ? ?PDMP not reviewed this encounter. ?  Jannifer Franklin?Ainhoa Rallo, MD ?09/03/21 [phone] ? ?

## 2021-09-03 NOTE — ED Triage Notes (Signed)
Pt reports flank pain with strong odor to urine since 24 or 25 . Pt took OTC AZO and some of SX's decreased.  ?

## 2021-09-03 NOTE — Discharge Instructions (Signed)
You were seen today for abdominal pain and urinary symptoms.  Your urine shows possible infection.  I have sent out an antibiotic to your pharmacy, and will culture the urine. We will call you with results if any changes need to be made.  ?Please continue to increase fluid intake.  ?If you have worsening symptoms, or symptoms do not improve, then please return for further evaluation.  ?

## 2022-09-04 ENCOUNTER — Emergency Department (HOSPITAL_COMMUNITY)
Admission: EM | Admit: 2022-09-04 | Discharge: 2022-09-04 | Disposition: A | Discharge status: Home / Self Care | Payer: Medicaid Other | Attending: Emergency Medicine | Admitting: Emergency Medicine

## 2022-09-04 ENCOUNTER — Other Ambulatory Visit: Payer: Self-pay

## 2022-09-04 ENCOUNTER — Encounter (HOSPITAL_COMMUNITY): Payer: Self-pay

## 2022-09-04 ENCOUNTER — Emergency Department (HOSPITAL_BASED_OUTPATIENT_CLINIC_OR_DEPARTMENT_OTHER)
Admission: EM | Admit: 2022-09-04 | Discharge: 2022-09-04 | Disposition: A | Discharge status: Home / Self Care | Payer: Medicaid Other | Attending: Emergency Medicine | Admitting: Emergency Medicine

## 2022-09-04 ENCOUNTER — Emergency Department (HOSPITAL_BASED_OUTPATIENT_CLINIC_OR_DEPARTMENT_OTHER): Payer: Medicaid Other

## 2022-09-04 ENCOUNTER — Encounter (HOSPITAL_BASED_OUTPATIENT_CLINIC_OR_DEPARTMENT_OTHER): Payer: Self-pay

## 2022-09-04 DIAGNOSIS — S62623D Displaced fracture of medial phalanx of left middle finger, subsequent encounter for fracture with routine healing: Secondary | ICD-10-CM | POA: Diagnosis not present

## 2022-09-04 DIAGNOSIS — S63611D Unspecified sprain of left index finger, subsequent encounter: Secondary | ICD-10-CM | POA: Insufficient documentation

## 2022-09-04 DIAGNOSIS — S62603D Fracture of unspecified phalanx of left middle finger, subsequent encounter for fracture with routine healing: Secondary | ICD-10-CM

## 2022-09-04 DIAGNOSIS — S63631D Sprain of interphalangeal joint of left index finger, subsequent encounter: Secondary | ICD-10-CM | POA: Diagnosis not present

## 2022-09-04 DIAGNOSIS — Z0471 Encounter for examination and observation following alleged adult physical abuse: Secondary | ICD-10-CM | POA: Diagnosis not present

## 2022-09-04 DIAGNOSIS — S62663D Nondisplaced fracture of distal phalanx of left middle finger, subsequent encounter for fracture with routine healing: Secondary | ICD-10-CM | POA: Diagnosis not present

## 2022-09-04 DIAGNOSIS — S62653A Nondisplaced fracture of medial phalanx of left middle finger, initial encounter for closed fracture: Secondary | ICD-10-CM | POA: Insufficient documentation

## 2022-09-04 DIAGNOSIS — S63602D Unspecified sprain of left thumb, subsequent encounter: Secondary | ICD-10-CM | POA: Insufficient documentation

## 2022-09-04 DIAGNOSIS — M25571 Pain in right ankle and joints of right foot: Secondary | ICD-10-CM | POA: Insufficient documentation

## 2022-09-04 DIAGNOSIS — M25572 Pain in left ankle and joints of left foot: Secondary | ICD-10-CM | POA: Insufficient documentation

## 2022-09-04 DIAGNOSIS — S6992XA Unspecified injury of left wrist, hand and finger(s), initial encounter: Secondary | ICD-10-CM | POA: Diagnosis present

## 2022-09-04 MED ORDER — OXYCODONE-ACETAMINOPHEN 5-325 MG PO TABS
2.0000 | ORAL_TABLET | Freq: Once | ORAL | Status: AC
  Administered 2022-09-04: 2 via ORAL
  Filled 2022-09-04: qty 2

## 2022-09-04 MED ORDER — IBUPROFEN 600 MG PO TABS: 600.0000 mg | ORAL_TABLET | Freq: Three times a day (TID) | ORAL | 0 refills | Status: AC | PRN

## 2022-09-04 MED ORDER — OXYCODONE HCL 5 MG PO TABS: 5.0000 mg | ORAL_TABLET | Freq: Four times a day (QID) | ORAL | 0 refills | Status: DC | PRN
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
## 2022-09-04 NOTE — Discharge Instructions (Signed)
Thank you for letting us take care of you today.  I reviewed your x-rays.  All of your middle finger is fractured.  The other 2 are likely sprain.  We placed these in finger splints to help with pain control.  I gave you a dose of pain medication in the ED to help with your pain.  You should not drive for 6 hours following this medication.  Please take ibuprofen as prescribed for home to help better control your pain.  You may take Tylenol 1000 mg every 6 hours in addition to the ibuprofen.  If your pain is not controlled with these medications, you may take 1 tablet of the oxycodone as prescribed until you are able to follow-up with orthopedics next week.  For any new injury or worsening symptoms, please return to the nearest emergency department for reevaluation.
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
## 2022-09-04 NOTE — Discharge Instructions (Signed)
Wear splint as applied until followed up by orthopedics.  Ice for 20 minutes every 2 hours while awake for the next 2 days.  Take ibuprofen 600 mg every 6 hours as needed for pain.  Follow-up with the orthopedic/hand surgeon in the next 3 to 4 days.  The contact information for Dr. Merlyn Lot has been provided in this discharge summary for you to call and make these arrangements.
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
## 2022-09-15 DIAGNOSIS — S6000XA Contusion of unspecified finger without damage to nail, initial encounter: Secondary | ICD-10-CM | POA: Diagnosis not present

## 2022-09-15 DIAGNOSIS — M79642 Pain in left hand: Secondary | ICD-10-CM | POA: Diagnosis not present

## 2022-09-15 DIAGNOSIS — M25642 Stiffness of left hand, not elsewhere classified: Secondary | ICD-10-CM | POA: Diagnosis not present

## 2022-09-15 DIAGNOSIS — S62653A Nondisplaced fracture of medial phalanx of left middle finger, initial encounter for closed fracture: Secondary | ICD-10-CM | POA: Diagnosis not present

## 2022-09-22 DIAGNOSIS — S62653A Nondisplaced fracture of medial phalanx of left middle finger, initial encounter for closed fracture: Secondary | ICD-10-CM | POA: Diagnosis not present

## 2022-09-23 DIAGNOSIS — S62653A Nondisplaced fracture of medial phalanx of left middle finger, initial encounter for closed fracture: Secondary | ICD-10-CM | POA: Diagnosis not present

## 2022-09-23 DIAGNOSIS — M25642 Stiffness of left hand, not elsewhere classified: Secondary | ICD-10-CM | POA: Diagnosis not present

## 2022-09-23 DIAGNOSIS — M79642 Pain in left hand: Secondary | ICD-10-CM | POA: Diagnosis not present

## 2022-09-30 DIAGNOSIS — M79642 Pain in left hand: Secondary | ICD-10-CM | POA: Diagnosis not present

## 2022-09-30 DIAGNOSIS — S62653A Nondisplaced fracture of medial phalanx of left middle finger, initial encounter for closed fracture: Secondary | ICD-10-CM | POA: Diagnosis not present

## 2022-09-30 DIAGNOSIS — M25642 Stiffness of left hand, not elsewhere classified: Secondary | ICD-10-CM | POA: Diagnosis not present

## 2022-10-15 DIAGNOSIS — S6000XA Contusion of unspecified finger without damage to nail, initial encounter: Secondary | ICD-10-CM | POA: Diagnosis not present

## 2022-10-15 DIAGNOSIS — S62653A Nondisplaced fracture of medial phalanx of left middle finger, initial encounter for closed fracture: Secondary | ICD-10-CM | POA: Diagnosis not present

## 2022-10-15 DIAGNOSIS — S62643D Nondisplaced fracture of proximal phalanx of left middle finger, subsequent encounter for fracture with routine healing: Secondary | ICD-10-CM | POA: Diagnosis not present

## 2022-12-10 ENCOUNTER — Encounter (HOSPITAL_COMMUNITY): Payer: Self-pay

## 2022-12-10 ENCOUNTER — Ambulatory Visit (HOSPITAL_COMMUNITY)
Admission: RE | Admit: 2022-12-10 | Discharge: 2022-12-10 | Disposition: A | Discharge status: Home / Self Care | Payer: Medicaid Other | Source: Ambulatory Visit | Attending: Family Medicine | Admitting: Family Medicine

## 2022-12-10 VITALS — BP 129/88 | HR 72 | Temp 98.4°F | Resp 14

## 2022-12-10 DIAGNOSIS — J069 Acute upper respiratory infection, unspecified: Secondary | ICD-10-CM

## 2022-12-10 DIAGNOSIS — B9689 Other specified bacterial agents as the cause of diseases classified elsewhere: Secondary | ICD-10-CM | POA: Diagnosis not present

## 2022-12-10 DIAGNOSIS — R350 Frequency of micturition: Secondary | ICD-10-CM | POA: Insufficient documentation

## 2022-12-10 DIAGNOSIS — N760 Acute vaginitis: Secondary | ICD-10-CM | POA: Insufficient documentation

## 2022-12-10 DIAGNOSIS — Z1152 Encounter for screening for COVID-19: Secondary | ICD-10-CM | POA: Diagnosis not present

## 2022-12-10 DIAGNOSIS — R300 Dysuria: Secondary | ICD-10-CM

## 2022-12-10 LAB — POCT URINALYSIS DIP (MANUAL ENTRY)
Bilirubin, UA: NEGATIVE
Blood, UA: NEGATIVE
Glucose, UA: NEGATIVE mg/dL
Ketones, POC UA: NEGATIVE mg/dL
Leukocytes, UA: NEGATIVE
Nitrite, UA: NEGATIVE
Protein Ur, POC: NEGATIVE mg/dL
Spec Grav, UA: 1.025 (ref 1.010–1.025)
Urobilinogen, UA: 0.2 E.U./dL
pH, UA: 7 (ref 5.0–8.0)

## 2022-12-10 MED ORDER — NITROFURANTOIN MONOHYD MACRO 100 MG PO CAPS: 100.0000 mg | ORAL_CAPSULE | Freq: Two times a day (BID) | ORAL | 0 refills | Status: DC

## 2022-12-10 NOTE — ED Provider Notes (Addendum)
[facility]    CSN: [id number] Arrival date & time: 12/10/22  1726      History   Chief Complaint Chief Complaint  Patient presents with   Dysuria   Urinary Frequency   Back Pain    HPI Danielle Bradley is a 27 y.o. female.    Dysuria Urinary Frequency  Back Pain Associated symptoms: dysuria   Here for dysuria and urinary frequency and low back pain.  Symptoms began about July 30.  No fever or chills.  She has not had any vaginal discharge.  Last menstrual cycle was July 16.  She also has had cough and congestion since July 31.  No nausea or vomiting or diarrhea.    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 Authorizing Provider  nitrofurantoin, macrocrystal-monohydrate, (MACROBID) 100 MG capsule Take 1 capsule (100 mg total) by mouth 2 (two) times daily. 12/10/22  Yes Zenia Resides, MD    Family History Family History  Problem Relation Age of Onset   Neurofibromatosis Mother    Neurofibromatosis Sister    Neurofibromatosis Maternal Grandmother     Social History Social History   Tobacco Use   Smoking status: Never   Smokeless tobacco: Never  Vaping Use   Vaping status: Never Used  Substance Use Topics   Alcohol use: Yes   Drug use: No     Allergies    Patient has no known allergies.   Review of Systems Review of Systems  Genitourinary:  Positive for dysuria and frequency.  Musculoskeletal:  Positive for back pain.     Physical Exam Triage Vital Signs ED Triage Vitals  Encounter Vitals Group     BP 12/10/22 1828 129/88     Systolic BP Percentile --      Diastolic BP Percentile --      Pulse Rate 12/10/22 1828 72     Resp 12/10/22 1828 14     Temp 12/10/22 1828 98.4 F (36.9 C)     Temp Source 12/10/22 1828 Oral     SpO2 12/10/22 1828 98 %     Weight --      Height --      Head Circumference --      Peak Flow --      Pain Score 12/10/22 1830 6     Pain Loc --      Pain Education --      Exclude from Growth Chart --    No data found.  Updated Vital Signs BP  129/88 (BP Location: Left Arm)   Pulse 72   Temp 98.4 F (36.9 C) (Oral)   Resp 14   LMP 11/23/2022   SpO2 98%   Visual Acuity Right Eye Distance:   Left Eye Distance:   Bilateral Distance:    Right Eye Near:   Left Eye Near:    Bilateral Near:     Physical Exam Vitals reviewed.  Constitutional:      General: She is not in acute distress.    Appearance: She is not toxic-appearing.  HENT:     Right Ear: Tympanic membrane and ear canal normal.     Left Ear: Tympanic membrane and ear canal normal.     Nose: Nose normal.     Mouth/Throat:     Mouth: Mucous membranes are moist.     Pharynx: No oropharyngeal exudate or posterior oropharyngeal erythema.  Eyes:     Extraocular Movements: Extraocular movements intact.     Conjunctiva/sclera: Conjunctivae normal.     Pupils: Pupils are equal, round, and reactive to light.  Cardiovascular:     Rate and Rhythm: Normal rate and regular rhythm.     Heart sounds: No murmur heard. Pulmonary:     Effort: Pulmonary effort is normal. No respiratory distress.     Breath sounds: No stridor. No wheezing, rhonchi or rales.  Abdominal:     Palpations: Abdomen is soft.     Tenderness: There is no abdominal  tenderness.  Musculoskeletal:     Cervical back: Neck supple.  Lymphadenopathy:     Cervical: No cervical adenopathy.  Skin:    Capillary Refill: Capillary refill takes less than 2 seconds.     Coloration: Skin is not jaundiced or pale.  Neurological:     General: No focal deficit present.     Mental Status: She is alert and oriented to person, place, and time.  Psychiatric:        Behavior: Behavior normal.      UC Treatments / Results  Labs (all labs ordered are listed, but only abnormal results are displayed) Labs Reviewed  POCT URINALYSIS DIP (MANUAL ENTRY) - Abnormal; Notable for the following components:      Result Value   Clarity, UA hazy (*)    All other components within normal limits  URINE CULTURE  SARS CORONAVIRUS 2 (TAT 6-24 HRS)  CERVICOVAGINAL ANCILLARY ONLY    EKG   Radiology No results found.  Procedures Procedures (including critical care time)  Medications Ordered in UC Medications - No data to display  Initial Impression / Assessment and Plan / UC Course  I have reviewed the triage vital signs and the nursing notes.  Pertinent labs & imaging results that were available during my care of the patient were reviewed by me and considered in my medical decision making (see chart for details).        Urinalysis is hazy but there is no other abnormality.  Urine culture is sent.  Her symptoms seem typical for UTI, so Macrodantin is sent in.  If culture is negative, she can stop the Macrodantin. Vaginal self swab is done, and we will notify of any positives on that and treat per protocol.  COVID swab was done and if positive she will know she needs to isolate.  Results will go to MyChart Final Clinical Impressions(s) / UC Diagnoses   Final diagnoses:  Dysuria  Acute upper respiratory infection     Discharge Instructions      The urinalysis  was clear.  Take nitrofurantoin 100 mg--1 capsule 2 times daily for 5 days  Urine culture is  sent, and staff will notify you if it looks like the antibiotic needs to be changed or stopped  Staff will notify you if there is anything positive on the vaginal swab.   You have been swabbed for COVID, and the test will result in the next 24 hours. Our staff will call you if positive. If the COVID test is positive, you should quarantine until you are fever free for 24 hours and you are starting to feel better, and then take added precautions for the next 5 days, such as physical distancing/wearing a mask and good hand hygiene/washing.        ED Prescriptions     Medication Sig Dispense Auth. Provider   nitrofurantoin, macrocrystal-monohydrate, (MACROBID) 100 MG capsule Take 1 capsule (100 mg total) by mouth 2 (two) times daily. 10 capsule Zenia Resides, MD      PDMP not reviewed this encounter.   Zenia Resides, MD 12/10/22 1900    Zenia Resides, MD 12/10/22 Ebony Cargo    Zenia Resides, MD 12/10/22 Ebony Cargo

## 2022-12-10 NOTE — ED Triage Notes (Addendum)
Patient c/o bilateral lower back pain, dysuria, and urinary frequency x 3 days. Patient added that she has had a headache today.  Patient states she had taken Tylenol and the last dose was at 1500 today.

## 2022-12-10 NOTE — Discharge Instructions (Signed)
The urinalysis was clear.  Take nitrofurantoin 100 mg--1 capsule 2 times daily for 5 days  Urine culture is sent, and staff will notify you if it looks like the antibiotic needs to be changed or stopped  Staff will notify you if there is anything positive on the vaginal swab.   You have been swabbed for COVID, and the test will result in the next 24 hours. Our staff will call you if positive. If the COVID test is positive, you should quarantine until you are fever free for 24 hours and you are starting to feel better, and then take added precautions for the next 5 days, such as physical distancing/wearing a mask and good hand hygiene/washing.

## 2022-12-14 ENCOUNTER — Telehealth (HOSPITAL_COMMUNITY): Payer: Self-pay

## 2022-12-14 MED ORDER — METRONIDAZOLE 500 MG PO TABS: 500.0000 mg | ORAL_TABLET | Freq: Two times a day (BID) | ORAL | 0 refills | Status: DC

## 2022-12-14 NOTE — Telephone Encounter (Signed)
Per protocol, patient will need treatment with Metronidazole for positive BV Prescription sent to pharmacy on file

## 2023-02-08 ENCOUNTER — Ambulatory Visit (INDEPENDENT_AMBULATORY_CARE_PROVIDER_SITE_OTHER): Payer: Medicaid Other | Admitting: Obstetrics and Gynecology

## 2023-02-08 ENCOUNTER — Other Ambulatory Visit (HOSPITAL_COMMUNITY)
Admission: RE | Admit: 2023-02-08 | Discharge: 2023-02-08 | Disposition: A | Discharge status: Home / Self Care | Payer: Medicaid Other | Source: Ambulatory Visit | Attending: Obstetrics and Gynecology | Admitting: Obstetrics and Gynecology

## 2023-02-08 ENCOUNTER — Encounter: Payer: Self-pay | Admitting: Obstetrics and Gynecology

## 2023-02-08 VITALS — BP 118/85 | HR 102 | Ht 60.0 in | Wt 96.5 lb

## 2023-02-08 DIAGNOSIS — Z01419 Encounter for gynecological examination (general) (routine) without abnormal findings: Secondary | ICD-10-CM | POA: Diagnosis not present

## 2023-02-08 DIAGNOSIS — O26851 Spotting complicating pregnancy, first trimester: Secondary | ICD-10-CM

## 2023-02-08 DIAGNOSIS — Z3202 Encounter for pregnancy test, result negative: Secondary | ICD-10-CM

## 2023-02-08 DIAGNOSIS — Z1339 Encounter for screening examination for other mental health and behavioral disorders: Secondary | ICD-10-CM | POA: Diagnosis not present

## 2023-02-08 DIAGNOSIS — Z3201 Encounter for pregnancy test, result positive: Secondary | ICD-10-CM

## 2023-02-08 DIAGNOSIS — O4690 Antepartum hemorrhage, unspecified, unspecified trimester: Secondary | ICD-10-CM | POA: Diagnosis not present

## 2023-02-08 DIAGNOSIS — Z3A01 Less than 8 weeks gestation of pregnancy: Secondary | ICD-10-CM

## 2023-02-08 DIAGNOSIS — Z331 Pregnant state, incidental: Secondary | ICD-10-CM

## 2023-02-08 LAB — POCT URINE PREGNANCY: Preg Test, Ur: NEGATIVE

## 2023-02-08 NOTE — Progress Notes (Signed)
ANNUAL EXAM Patient name: Danielle Bradley MRN [id number]  Date of birth: 09-02-1995 Chief Complaint:   New Patient (Initial Visit) and Annual Exam  History of Present Illness:   Danielle Bradley is a 27 y.o. [phone] with Patient's last menstrual period was 02/02/2023. being seen today for a routine annual exam.  Current complaints:   Has + UPT last week, then started bleeding 6 days ago heavier than her normal period. She is not saturating pads in < 1 hour. Home UPT then negative. Presents today for annual and to discuss contraception.    [facility] - 02/08/23 1055       Pregnancy Intention Screening   Does the patient want to become pregnant in the next year? No    Does the patient's partner want to become pregnant in the next year? No    Would the patient like to discuss contraceptive options today? Yes      Contraception Wrap Up   Current Method No Contraceptive Precautions    End Method Hormonal Injection    Contraception Counseling Provided Yes    How was the end contraceptive method provided? N/A            The pregnancy intention screening data noted above was reviewed. Potential methods of contraception were discussed. The patient elected to proceed with Hormonal Injection.   Last pap 02/2018. Results were: NILM. H/O abnormal pap: no Last mammogram: n/a. Results were: N/A. Family h/o breast cancer: no Last colonoscopy: n/a. Results were: N/A. Family h/o colorectal cancer: no HPV vaccine: Completed     02/08/2023   10:55 AM  Depression screen PHQ 2/9  Decreased Interest 0  Down, Depressed, Hopeless 0  PHQ - 2 Score 0        02/08/2023   10:55 AM  GAD 7 : Generalized Anxiety Score  Nervous, Anxious, on Edge 0  Control/stop worrying 0  Worry too much - different things 0  Trouble relaxing 0  Restless 0  Easily annoyed or irritable 0  Afraid - awful might happen 0  Total GAD 7 Score 0     Review of Systems:   Pertinent items are noted in HPI Denies any  headaches, blurred vision, fatigue, shortness of breath, chest pain, abdominal pain, abnormal vaginal discharge/itching/odor/irritation, problems with periods, bowel movements, urination, or intercourse unless otherwise stated above. Pertinent History Reviewed:  Reviewed past medical,surgical, social and family history.  Reviewed problem list, medications and allergies. Physical Assessment:   Vitals:   02/08/23 1050 02/08/23 1058  BP: (!) 134/92 118/85  Pulse: (!) 103 (!) 102  Weight: 96 lb 8 oz (43.8 kg)   Height: 5' (1.524 m)   Body mass index is 18.85 kg/m.        Physical Examination:   General appearance - well appearing, and in no distress  Mental status - alert, oriented to person, place, and time  Chest - respiratory effort normal  Heart - normal peripheral perfusion  Breasts - deferred  Abdomen - soft, nontender, nondistended, no masses or organomegaly  Pelvic - VULVA: normal appearing vulva with no masses, tenderness or lesions  VAGINA: normal appearing vagina with normal color and discharge, no lesions  CERVIX: normal appearing cervix without discharge or lesions, no CMT  Thin prep pap is done with reflex HR HPV cotesting.  Chaperone present for exam  No results found for this or any previous visit (from the past 24 hour(s)).  Assessment & Plan:  1) Well-Woman Exam Mammogram: @ 28yo, or  sooner if problems Colonoscopy: @ 28yo, or sooner if problems Pap: Collected Gardasil: Completed GC/CT: Ordered HIV/HCV: Ordered  2) Vaginal bleeding in pregnancy Initially pregnancy test was negative, but then faint positive after the 3 minutes so RN repeated test. Repeat test was faint positive.  Will obtain bHCG today and in 48 hours. If downtrending can likely administer Depo in 48 hours. Will bring back for lab visit in 48 hours  Labs/procedures today:   Orders Placed This Encounter  Procedures   HIV antibody (with reflex)   RPR   Hepatitis B Surface AntiGEN   Hepatitis  C Antibody   Beta hCG quant (ref lab)   POCT urine pregnancy   Meds: No orders of the defined types were placed in this encounter.  Follow-up: Return in about 2 days (around 02/10/2023) for lab & RN visit - bHCG and possible depo start.  Lennart Pall, MD 02/08/2023 12:31 PM

## 2023-02-08 NOTE — Progress Notes (Signed)
Pt is new to office for AEX and discuss BC.  Pt states she is interested in Depo.  Pt has used Depo in the past. Pt states she had a +upt at home last week and then started bleeding on 9/25- pt is still bleeding and cramping. Pt states bleeding has been heavier than normal.

## 2023-02-09 LAB — HEPATITIS B SURFACE ANTIGEN: Hepatitis B Surface Ag: NEGATIVE

## 2023-02-09 LAB — HEPATITIS C ANTIBODY: Hep C Virus Ab: NONREACTIVE

## 2023-02-09 LAB — RPR: RPR Ser Ql: NONREACTIVE

## 2023-02-09 LAB — CERVICOVAGINAL ANCILLARY ONLY
Chlamydia: NEGATIVE
Comment: NEGATIVE
Comment: NEGATIVE
Comment: NORMAL
Neisseria Gonorrhea: NEGATIVE
Trichomonas: NEGATIVE

## 2023-02-09 LAB — BETA HCG QUANT (REF LAB): hCG Quant: 25 m[IU]/mL

## 2023-02-09 LAB — HIV ANTIBODY (ROUTINE TESTING W REFLEX): HIV Screen 4th Generation wRfx: NONREACTIVE

## 2023-02-10 ENCOUNTER — Encounter (HOSPITAL_COMMUNITY): Payer: Self-pay

## 2023-02-10 ENCOUNTER — Ambulatory Visit: Payer: Medicaid Other | Admitting: Emergency Medicine

## 2023-02-10 ENCOUNTER — Emergency Department (HOSPITAL_COMMUNITY)
Admission: EM | Admit: 2023-02-10 | Discharge: 2023-02-11 | Disposition: A | Discharge status: Home / Self Care | Payer: Medicaid Other

## 2023-02-10 ENCOUNTER — Emergency Department (HOSPITAL_COMMUNITY): Payer: Medicaid Other

## 2023-02-10 ENCOUNTER — Other Ambulatory Visit: Payer: Medicaid Other

## 2023-02-10 ENCOUNTER — Other Ambulatory Visit: Payer: Self-pay

## 2023-02-10 VITALS — BP 109/66 | HR 80 | Wt 96.0 lb

## 2023-02-10 DIAGNOSIS — M79605 Pain in left leg: Secondary | ICD-10-CM | POA: Diagnosis not present

## 2023-02-10 DIAGNOSIS — O4690 Antepartum hemorrhage, unspecified, unspecified trimester: Secondary | ICD-10-CM

## 2023-02-10 DIAGNOSIS — M79602 Pain in left arm: Secondary | ICD-10-CM | POA: Diagnosis not present

## 2023-02-10 DIAGNOSIS — R42 Dizziness and giddiness: Secondary | ICD-10-CM | POA: Diagnosis not present

## 2023-02-10 DIAGNOSIS — Z1152 Encounter for screening for COVID-19: Secondary | ICD-10-CM | POA: Insufficient documentation

## 2023-02-10 DIAGNOSIS — M50023 Cervical disc disorder at C6-C7 level with myelopathy: Secondary | ICD-10-CM | POA: Diagnosis not present

## 2023-02-10 LAB — BASIC METABOLIC PANEL
Anion gap: 12 (ref 5–15)
BUN: 9 mg/dL (ref 6–20)
CO2: 22 mmol/L (ref 22–32)
Calcium: 8.8 mg/dL — ABNORMAL LOW (ref 8.9–10.3)
Chloride: 106 mmol/L (ref 98–111)
Creatinine, Ser: 0.5 mg/dL (ref 0.44–1.00)
GFR, Estimated: >60 mL/min (ref >60)
Glucose, Bld: 79 mg/dL (ref 70–99)
Potassium: 3.6 mmol/L (ref 3.5–5.1)
Sodium: 140 mmol/L (ref 135–145)

## 2023-02-10 LAB — URINALYSIS, ROUTINE W REFLEX MICROSCOPIC
Bacteria, UA: NONE SEEN
Bilirubin Urine: NEGATIVE
Glucose, UA: NEGATIVE mg/dL
Ketones, ur: 20 mg/dL — AB
Leukocytes,Ua: NEGATIVE
Nitrite: NEGATIVE
Protein, ur: NEGATIVE mg/dL
Specific Gravity, Urine: 1.024 (ref 1.005–1.030)
pH: 6 (ref 5.0–8.0)

## 2023-02-10 LAB — CBC WITH DIFFERENTIAL/PLATELET
Abs Immature Granulocytes: 0.02 10*3/uL (ref 0.00–0.07)
Basophils Absolute: 0 10*3/uL (ref 0.0–0.1)
Basophils Relative: 0 %
Eosinophils Absolute: 0 10*3/uL (ref 0.0–0.5)
Eosinophils Relative: 0 %
HCT: 34.3 % — ABNORMAL LOW (ref 36.0–46.0)
Hemoglobin: 10.4 g/dL — ABNORMAL LOW (ref 12.0–15.0)
Immature Granulocytes: 0 %
Lymphocytes Relative: 41 %
Lymphs Abs: 2.5 10*3/uL (ref 0.7–4.0)
MCH: 24.2 pg — ABNORMAL LOW (ref 26.0–34.0)
MCHC: 30.3 g/dL (ref 30.0–36.0)
MCV: 79.8 fL — ABNORMAL LOW (ref 80.0–100.0)
Monocytes Absolute: 0.9 10*3/uL (ref 0.1–1.0)
Monocytes Relative: 14 %
Neutro Abs: 2.8 10*3/uL (ref 1.7–7.7)
Neutrophils Relative %: 45 %
Platelets: 324 10*3/uL (ref 150–400)
RBC: 4.3 MIL/uL (ref 3.87–5.11)
RDW: 17.7 % — ABNORMAL HIGH (ref 11.5–15.5)
WBC: 6.2 10*3/uL (ref 4.0–10.5)
nRBC: 0 % (ref 0.0–0.2)

## 2023-02-10 LAB — PREGNANCY, URINE: Preg Test, Ur: NEGATIVE

## 2023-02-10 LAB — SARS CORONAVIRUS 2 BY RT PCR: SARS Coronavirus 2 by RT PCR: NEGATIVE

## 2023-02-10 MED ORDER — GADOBUTROL 1 MMOL/ML IV SOLN
4.0000 mL | Freq: Once | INTRAVENOUS | Status: AC | PRN
  Administered 2023-02-10: 4 mL via INTRAVENOUS

## 2023-02-10 MED ORDER — SODIUM CHLORIDE 0.9 % IV BOLUS
1000.0000 mL | Freq: Once | INTRAVENOUS | Status: AC
  Administered 2023-02-10: 1000 mL via INTRAVENOUS

## 2023-02-10 MED ORDER — MECLIZINE HCL 25 MG PO TABS
25.0000 mg | ORAL_TABLET | Freq: Once | ORAL | Status: AC
  Administered 2023-02-10: 25 mg via ORAL
  Filled 2023-02-10: qty 1

## 2023-02-10 NOTE — ED Provider Triage Note (Signed)
Emergency Medicine Provider Triage Evaluation Note  Danielle Bradley , a 27 y.o. female  was evaluated in triage.  Pt complains of lightheaded and dizzy with standing. Additionally, she reports pain and a "shocking" sensation in left arm and leg. She also feels short of breath at all times as of today.  Review of Systems  Positive: Lightheaded with standing, SOB, left side arm and leg pain Negative: Fever, abdominal pain  Physical Exam  BP 125/83   Pulse 76   Temp 98.5 F (36.9 C) (Oral)   Resp 17   Ht 5' (1.524 m)   Wt 43.5 kg   LMP 02/02/2023   SpO2 100%   BMI 18.75 kg/m  Gen:   Awake, no distress   Resp:  Normal effort  MSK:   Moves extremities without difficulty  Other:    Medical Decision Making  Medically screening exam initiated at 4:13 PM.  Appropriate orders placed.  Danielle Bradley was informed that the remainder of the evaluation will be completed by another provider, this initial triage assessment does not replace that evaluation, and the importance of remaining in the ED until their evaluation is complete.   Maxwell Marion, PA-C 02/10/23 [phone]

## 2023-02-10 NOTE — ED Provider Notes (Signed)
[place] EMERGENCY DEPARTMENT AT [facility] Provider Note   CSN: [id number] Arrival date & time: 02/10/23  1532     History  Chief Complaint  Patient presents with   Arm Pain   Leg Pain    Danielle Bradley is a 27 y.o. female with a history of neurofibromatosis type I who presents to the ED today for multiple concerns.  Patient reports that since 2 PM today she has been having a "shock-like" sensation going down her left arm and left leg intermittently.  Additionally, she feels lightheaded with standing which also started this afternoon with associated shortness of breath and nausea.  Denies fever, chest pain, back pain, or abdominal pain.  No additional complaints or concerns at this time.  She used to see neurology for her NF1 as a child but has not in many years.    Home Medications Prior to Admission medications   Medication Sig Start Date End Date Taking? Authorizing Provider  meclizine (ANTIVERT) 25 MG tablet Take 1 tablet (25 mg total) by mouth 3 (three) times daily as needed for up to 14 days for dizziness. 02/11/23 02/25/23 Yes Maxwell Marion, PA-C  ondansetron (ZOFRAN) 4 MG tablet Take 1 tablet (4 mg total) by mouth every 8 (eight) hours as needed for up to 14 days for nausea or vomiting. 02/11/23 02/25/23 Yes Maxwell Marion, PA-C      Allergies    Patient has no known allergies.    Review of Systems   Review of Systems  Neurological:  Positive for light-headedness.  All other systems reviewed and are negative.   Physical Exam Updated Vital Signs BP 130/86   Pulse 78   Temp 98.1 F (36.7 C) (Oral)   Resp 16   Ht 5' (1.524 m)   Wt 43.5 kg   LMP 02/02/2023   SpO2 100%   BMI 18.75 kg/m  Physical Exam Vitals and nursing note reviewed.  Constitutional:      General: She is not in acute distress.    Appearance: Normal appearance.  HENT:     Head: Normocephalic and atraumatic.     Mouth/Throat:     Mouth: Mucous membranes are moist.  Eyes:      Conjunctiva/sclera: Conjunctivae normal.     Pupils: Pupils are equal, round, and reactive to light.  Cardiovascular:     Rate and Rhythm: Normal rate and regular rhythm.     Pulses: Normal pulses.     Heart sounds: Normal heart sounds.  Pulmonary:     Effort: Pulmonary effort is normal.     Breath sounds: Normal breath sounds.  Abdominal:     Palpations: Abdomen is soft.     Tenderness: There is no abdominal tenderness.  Musculoskeletal:        General: Normal range of motion.     Cervical back: Normal range of motion.  Skin:    General: Skin is warm and dry.     Findings: No rash.  Neurological:     General: No focal deficit present.     Mental Status: She is alert.     Sensory: No sensory deficit.     Motor: No weakness.     Gait: Gait normal.  Psychiatric:        Mood and Affect: Mood normal.        Behavior: Behavior normal.     Orthostatic Lying BP- Lying: 130/95 Abnormal  Pulse- Lying: 72 Orthostatic Sitting BP- Sitting: 125/88 Pulse- Sitting: 68 Orthostatic Standing  at 0 minutes BP- Standing at 0 minutes: 133/85 Pulse- Standing at 0 minutes: 84 Orthostatic Standing at 3 minutes BP- Standing at 3 minutes: 130/99 Abnormal  Pulse- Standing at 3 minutes: 85   ED Results / Procedures / Treatments   Labs (all labs ordered are listed, but only abnormal results are displayed) Labs Reviewed  BASIC METABOLIC PANEL - Abnormal; Notable for the following components:      Result Value   Calcium 8.8 (*)    All other components within normal limits  CBC WITH DIFFERENTIAL/PLATELET - Abnormal; Notable for the following components:   Hemoglobin 10.4 (*)    HCT 34.3 (*)    MCV 79.8 (*)    MCH 24.2 (*)    RDW 17.7 (*)    All other components within normal limits  URINALYSIS, ROUTINE W REFLEX MICROSCOPIC - Abnormal; Notable for the following components:   Hgb urine dipstick MODERATE (*)    Ketones, ur 20 (*)    All other components within normal limits  SARS  CORONAVIRUS 2 BY RT PCR  PREGNANCY, URINE    EKG None  Radiology MR Cervical Spine W and Wo Contrast  Result Date: 02/11/2023 CLINICAL DATA:  Acute cervical myelopathy EXAM: MRI CERVICAL SPINE WITHOUT AND WITH CONTRAST TECHNIQUE: Multiplanar and multiecho pulse sequences of the cervical spine, to include the craniocervical junction and cervicothoracic junction, were obtained without and with intravenous contrast. CONTRAST:  4mL GADAVIST GADOBUTROL 1 MMOL/ML IV SOLN COMPARISON:  None Available. FINDINGS: Alignment: Physiologic. Vertebrae: No fracture, evidence of discitis, or bone lesion. Cord: Normal signal and morphology. Posterior Fossa, vertebral arteries, paraspinal tissues: Negative. Disc levels: C1-2: Unremarkable. C2-3: Normal disc space and facet joints. There is no spinal canal stenosis. No neural foraminal stenosis. C3-4: Normal disc space and facet joints. There is no spinal canal stenosis. No neural foraminal stenosis. C4-5: Normal disc space and facet joints. There is no spinal canal stenosis. No neural foraminal stenosis. C5-6: Normal disc space and facet joints. There is no spinal canal stenosis. No neural foraminal stenosis. C6-7: Small central disc protrusion. There is no spinal canal stenosis. No neural foraminal stenosis. C7-T1: Normal disc space and facet joints. There is no spinal canal stenosis. No neural foraminal stenosis. IMPRESSION: 1. No acute abnormality of the cervical spine. 2. Small central disc protrusion at C6-7 without spinal canal or neural foraminal stenosis. Electronically Signed   By: Deatra Robinson M.D.   On: 02/11/2023 00:09    Procedures Procedures: not indicated.   Medications Ordered in ED Medications  meclizine (ANTIVERT) tablet 25 mg (25 mg Oral Given 02/10/23 1953)  sodium chloride 0.9 % bolus 1,000 mL (0 mLs Intravenous Stopped 02/10/23 2152)  gadobutrol (GADAVIST) 1 MMOL/ML injection 4 mL (4 mLs Intravenous Contrast Given 02/10/23 2244)    ED Course/  Medical Decision Making/ A&P                                 Medical Decision Making Amount and/or Complexity of Data Reviewed Labs: ordered. Radiology: ordered.  Risk Prescription drug management.   This patient presents to the ED for concern of shock-like sensation in left arm and leg, this involves an extensive number of treatment options, and is a complaint that carries with it a high risk of complications and morbidity.   Differential diagnosis includes: NF1 flare, electrolyte abnormality, dehydration, viral illness, orthostatic hypotension, BPPV, etc.   Comorbidities  See HPI above  Additional History  Additional history obtained from previous records.   Cardiac Monitoring / EKG  The patient was maintained on a cardiac monitor.  I personally viewed and interpreted the cardiac monitored which showed: sinus rhythm with a heart rate of 77 bpm.   Lab Tests  I ordered and personally interpreted labs.  The pertinent results include:   Negative COVID test UA is within normal limits - no acute infection BMP and CBC are within normal limits - she is currently menstruating Negative pregnancy test.   Imaging Studies  I ordered imaging studies including MR cervical spine  I independently visualized and interpreted imaging which showed: No acute abnormality of the cervical spine.  Small central disc extrusion at C6-7 without spinal canal or neuroforaminal stenosis. I agree with the radiologist interpretation   Consultations  I requested consultation with neurology,  and discussed lab and imaging findings as well as pertinent plan - they recommend: MRI cervical spine and outpatient neurology follow up.   Problem List / ED Course / Critical Interventions / Medication Management  Shock-like pain in left arm and leg, lightheadedness with standing, nausea I ordered medications including: Meclizine and normal saline for lightheadedness Reevaluation of the patient after  these medicines showed that the patient improved I have reviewed the patients home medicines and have made adjustments as needed   Social Determinants of Health  Access to healthcare   Test / Admission - Considered  Discussed results with patient.  All questions were answered. Patient is hemodynamically stable and safe for discharge home. Information for neurology provided. Return precautions provided.       Final Clinical Impression(s) / ED Diagnoses Final diagnoses:  Left leg pain  Left arm pain  Vertigo    Rx / DC Orders ED Discharge Orders          Ordered    meclizine (ANTIVERT) 25 MG tablet  3 times daily PRN        02/11/23 0023    ondansetron (ZOFRAN) 4 MG tablet  Every 8 hours PRN        02/11/23 0033              Maxwell Marion, PA-C 02/11/23 0036    Durwin Glaze, MD 02/11/23 [phone]

## 2023-02-10 NOTE — ED Triage Notes (Signed)
Pt complaining of a sharp/electric pain in her left arm and leg. Pt states that it began today around 1400. Denies any injury, lifting, or bending when pain began. Pt does endorse some SOB, and nausea.

## 2023-02-10 NOTE — Progress Notes (Signed)
Pt presents for follow up beta HCG following bleeding in early pregnancy.  Continues to have some vaginal bleeding and cramping.  Pt informed that once results are reviewed by provider, she may receive Depo if indicated.

## 2023-02-11 LAB — BETA HCG QUANT (REF LAB): hCG Quant: 9 m[IU]/mL

## 2023-02-11 MED ORDER — MECLIZINE HCL 25 MG PO TABS: 25.0000 mg | ORAL_TABLET | Freq: Three times a day (TID) | ORAL | 0 refills | Status: AC | PRN

## 2023-02-11 MED ORDER — ONDANSETRON HCL 4 MG PO TABS: 4.0000 mg | ORAL_TABLET | Freq: Three times a day (TID) | ORAL | 0 refills | Status: AC | PRN

## 2023-02-11 NOTE — Discharge Instructions (Addendum)
As discussed, your imaging and labs are unremarkable.  I have sent a prescription of meclizine to your pharmacy.  Take this as needed needed for dizziness.  Please call [facility] tomorrow.  Tell them that you need an appointment as a hospital follow-up and they should get you in within the next week.  Follow-up with your PCP in 1 week for reevaluation of your lightheadedness symptoms.  Return to the ED, if your symptoms worsen in the interim.

## 2023-02-14 ENCOUNTER — Other Ambulatory Visit: Payer: Self-pay

## 2023-02-14 DIAGNOSIS — Z30013 Encounter for initial prescription of injectable contraceptive: Secondary | ICD-10-CM

## 2023-02-14 LAB — CYTOLOGY - PAP: Diagnosis: NEGATIVE

## 2023-02-14 MED ORDER — MEDROXYPROGESTERONE ACETATE 150 MG/ML IM SUSP
150.0000 mg | INTRAMUSCULAR | 3 refills | Status: DC
Start: 2023-02-14 — End: 2023-09-15

## 2023-02-17 ENCOUNTER — Ambulatory Visit: Payer: Medicaid Other

## 2023-09-05 ENCOUNTER — Ambulatory Visit

## 2023-09-05 VITALS — BP 113/73 | HR 90

## 2023-09-05 DIAGNOSIS — Z3201 Encounter for pregnancy test, result positive: Secondary | ICD-10-CM

## 2023-09-05 LAB — POCT URINE PREGNANCY: Preg Test, Ur: POSITIVE — AB

## 2023-09-05 MED ORDER — PRENATAL 28-0.8 MG PO TABS
1.0000 | ORAL_TABLET | Freq: Every day | ORAL | 12 refills | Status: DC
Start: 2023-09-05 — End: 2024-01-30

## 2023-09-05 NOTE — Progress Notes (Signed)
 Danielle Bradley here for a UPT. Pt had a positive upt at home. LMP is 06/27/23.     UPT in office Positive.    Reviewed medications and informed to start a PNV, if not already. Pt to follow up in 1 week for New OB Intake visit.    Prenatal care to be completed at [facility]

## 2023-09-14 ENCOUNTER — Encounter: Payer: Self-pay | Admitting: *Deleted

## 2023-09-14 ENCOUNTER — Other Ambulatory Visit: Payer: Self-pay | Admitting: *Deleted

## 2023-09-15 ENCOUNTER — Ambulatory Visit: Admitting: *Deleted

## 2023-09-15 ENCOUNTER — Other Ambulatory Visit (INDEPENDENT_AMBULATORY_CARE_PROVIDER_SITE_OTHER): Payer: Self-pay

## 2023-09-15 VITALS — BP 116/71 | HR 86 | Wt 97.4 lb

## 2023-09-15 DIAGNOSIS — Z3481 Encounter for supervision of other normal pregnancy, first trimester: Secondary | ICD-10-CM

## 2023-09-15 DIAGNOSIS — Z3480 Encounter for supervision of other normal pregnancy, unspecified trimester: Secondary | ICD-10-CM

## 2023-09-15 DIAGNOSIS — O3680X0 Pregnancy with inconclusive fetal viability, not applicable or unspecified: Secondary | ICD-10-CM | POA: Diagnosis not present

## 2023-09-15 DIAGNOSIS — Z3A01 Less than 8 weeks gestation of pregnancy: Secondary | ICD-10-CM

## 2023-09-15 DIAGNOSIS — Z1339 Encounter for screening examination for other mental health and behavioral disorders: Secondary | ICD-10-CM

## 2023-09-15 MED ORDER — BLOOD PRESSURE KIT DEVI: 1.0000 | 0 refills | Status: DC

## 2023-09-15 NOTE — Progress Notes (Signed)
 New OB Intake  I connected with Danielle Bradley  on 09/15/23 at 10:15 AM EDT by In Person Visit and verified that I am speaking with the correct person using two identifiers. Nurse is located at [facility] and pt is located at [place].  I discussed the limitations, risks, security and privacy concerns of performing an evaluation and management service by telephone and the availability of in person appointments. I also discussed with the patient that there may be a patient responsible charge related to this service. The patient expressed understanding and agreed to proceed.  I explained I am completing New OB Intake today. We discussed EDD of 04/02/2024, by Last Menstrual Period. Pt is G3P2002. I reviewed her allergies, medications and Medical/Surgical/OB history.    Patient Active Problem List   Diagnosis Date Noted   Neurofibromatosis, type I (von Recklinghausen's disease) (HCC) 03/26/2014     Concerns addressed today  Delivery Plans Plans to deliver at [facility]. Discussed the nature of our practice with multiple providers including residents and students. Due to the size of the practice, the delivering provider may not be the same as those providing prenatal care.   Patient is not interested in water birth.  MyChart/Babyscripts MyChart access verified. I explained pt will have some visits in office and some virtually. Babyscripts instructions given and order placed. Patient verifies receipt of registration text/e-mail. Account successfully created and app downloaded. If patient is a candidate for Optimized scheduling, add to sticky note.   Blood Pressure Cuff/Weight Scale Blood pressure cuff ordered for patient to pick-up from [employer]. Explained after first prenatal appt pt will check weekly and document in Babyscripts. Patient does not have weight scale; patient may purchase if they desire to track weight weekly in Babyscripts.  Anatomy US  Explained first scheduled US  will be  around 19 weeks. Anatomy US  scheduled for NA at NA/ viability not confirmed.  Is patient a candidate for Babyscripts Optimization? No, due to Risk Factors   First visit review I reviewed new OB appt with patient. Explained pt will be seen by Dr. Willey Harrier at first visit. Discussed [facility] [place] genetic screening with patient. NA/ viability not confirmed Panorama and Horizon.. Routine prenatal labs OB Urine only collected.   Last Pap Diagnosis  Date Value Ref Range Status  02/08/2023   Final   - Negative for intraepithelial lesion or malignancy (NILM)    Donette Furlong, RN 09/15/2023  11:04 AM

## 2023-09-15 NOTE — Progress Notes (Signed)
 Intrauterine GS visualized. No FP or YS seen. Consulted with Dr. Willey Harrier. Plan to repeat US  in 2 weeks. Pt advised and verbalized understanding. SAB precautions reviewed.

## 2023-09-22 ENCOUNTER — Encounter: Admitting: Obstetrics & Gynecology

## 2023-09-22 DIAGNOSIS — Z3A12 12 weeks gestation of pregnancy: Secondary | ICD-10-CM

## 2023-09-22 DIAGNOSIS — Z3480 Encounter for supervision of other normal pregnancy, unspecified trimester: Secondary | ICD-10-CM

## 2023-09-29 ENCOUNTER — Other Ambulatory Visit

## 2023-10-05 ENCOUNTER — Telehealth: Payer: Self-pay

## 2023-10-05 ENCOUNTER — Encounter (HOSPITAL_COMMUNITY): Payer: Self-pay | Admitting: Family Medicine

## 2023-10-05 ENCOUNTER — Inpatient Hospital Stay (HOSPITAL_COMMUNITY)
Admission: AD | Admit: 2023-10-05 | Discharge: 2023-10-05 | Disposition: A | Discharge status: Home / Self Care | Attending: Family Medicine | Admitting: Family Medicine

## 2023-10-05 ENCOUNTER — Inpatient Hospital Stay (HOSPITAL_COMMUNITY)

## 2023-10-05 DIAGNOSIS — O039 Complete or unspecified spontaneous abortion without complication: Secondary | ICD-10-CM | POA: Diagnosis not present

## 2023-10-05 DIAGNOSIS — O209 Hemorrhage in early pregnancy, unspecified: Secondary | ICD-10-CM | POA: Diagnosis not present

## 2023-10-05 DIAGNOSIS — O3480 Maternal care for other abnormalities of pelvic organs, unspecified trimester: Secondary | ICD-10-CM | POA: Diagnosis not present

## 2023-10-05 DIAGNOSIS — Z3A14 14 weeks gestation of pregnancy: Secondary | ICD-10-CM | POA: Diagnosis not present

## 2023-10-05 DIAGNOSIS — D509 Iron deficiency anemia, unspecified: Secondary | ICD-10-CM

## 2023-10-05 DIAGNOSIS — O3680X0 Pregnancy with inconclusive fetal viability, not applicable or unspecified: Secondary | ICD-10-CM | POA: Diagnosis not present

## 2023-10-05 DIAGNOSIS — N61 Mastitis without abscess: Secondary | ICD-10-CM

## 2023-10-05 LAB — WET PREP, GENITAL
Clue Cells Wet Prep HPF POC: NONE SEEN
Sperm: NONE SEEN
Trich, Wet Prep: NONE SEEN
WBC, Wet Prep HPF POC: >=10 — AB (ref <10)
Yeast Wet Prep HPF POC: NONE SEEN

## 2023-10-05 LAB — CBC
HCT: 28.4 % — ABNORMAL LOW (ref 36.0–46.0)
Hemoglobin: 8.7 g/dL — ABNORMAL LOW (ref 12.0–15.0)
MCH: 24.6 pg — ABNORMAL LOW (ref 26.0–34.0)
MCHC: 30.6 g/dL (ref 30.0–36.0)
MCV: 80.2 fL (ref 80.0–100.0)
Platelets: 352 10*3/uL (ref 150–400)
RBC: 3.54 MIL/uL — ABNORMAL LOW (ref 3.87–5.11)
RDW: 18 % — ABNORMAL HIGH (ref 11.5–15.5)
WBC: 7.5 10*3/uL (ref 4.0–10.5)
nRBC: 0 % (ref 0.0–0.2)

## 2023-10-05 LAB — HCG, QUANTITATIVE, PREGNANCY: hCG, Beta Chain, Quant, S: 1900 m[IU]/mL — ABNORMAL HIGH (ref <5)

## 2023-10-05 MED ORDER — IBUPROFEN 800 MG PO TABS
800.0000 mg | ORAL_TABLET | Freq: Three times a day (TID) | ORAL | 0 refills | Status: AC
Start: 2023-10-05 — End: ?

## 2023-10-05 MED ORDER — ACETAMINOPHEN 500 MG PO TABS
1000.0000 mg | ORAL_TABLET | Freq: Once | ORAL | Status: AC
  Administered 2023-10-05: 1000 mg via ORAL
  Filled 2023-10-05: qty 2

## 2023-10-05 MED ORDER — CEFADROXIL 500 MG PO CAPS: 1.0000 g | ORAL_CAPSULE | Freq: Every day | ORAL | 0 refills | Status: DC

## 2023-10-05 NOTE — MAU Provider Note (Signed)
 None     S [name] is a 28 y.o. G24P2002 female at [redacted]w[redacted]d who presents to MAU today with complaint of vaginal bleeding. Bleeding and cramping started on 5/19 and was heavy, she notes she was passing clots and soaking through her pads. After a few days of heavy bleeding, it began to get lighter. Now, she is using one pad a day for spotting and is no longer cramping. She had a confirmed IUGS without YS or fetal pole on 09/15/2023. She also complains of right breast pain that started around 1300 today.  Receives care at [facility]. Prenatal records reviewed.  Pertinent items noted in HPI and remainder of comprehensive ROS otherwise negative.   O BP 132/87 (BP Location: Right Arm)   Pulse 75   Temp 98.6 F (37 C) (Oral)   Resp 16   Ht 5' (1.524 m)   Wt 45 kg   LMP 06/27/2023 (Approximate)   SpO2 100%   BMI 19.37 kg/m  Physical Exam Vitals reviewed. Exam conducted with a chaperone present Dorian Gardener RN).  Constitutional:      General: She is not in acute distress.    Appearance: She is well-developed. She is not diaphoretic.  Eyes:     General: No scleral icterus. Pulmonary:     Effort: Pulmonary effort is normal. No respiratory distress.  Genitourinary:    Exam position: Lithotomy position.     Labia:        Right: No rash, tenderness, lesion or injury.        Left: No rash, tenderness, lesion or injury.      Vagina: Normal.     Comments: Dark blood in vaginal canal, appears to still be coming from cervix. Multiparous os. Skin:    General: Skin is warm and dry.  Neurological:     Mental Status: She is alert.     Coordination: Coordination normal.    Results for orders placed or performed during the hospital encounter of 10/05/23 (from the past 24 hours)  CBC     Status: Abnormal   Collection Time: 10/05/23  7:18 PM  Result Value Ref Range   WBC 7.5 4.0 - 10.5 K/uL   RBC 3.54 (L) 3.87 - 5.11 MIL/uL   Hemoglobin 8.7 (L) 12.0 - 15.0 g/dL   HCT 45.4 (L) 09.8 -  46.0 %   MCV 80.2 80.0 - 100.0 fL   MCH 24.6 (L) 26.0 - 34.0 pg   MCHC 30.6 30.0 - 36.0 g/dL   RDW 11.9 (H) 14.7 - 82.9 %   Platelets 352 150 - 400 K/uL   nRBC 0.0 0.0 - 0.2 %  hCG, quantitative, pregnancy     Status: Abnormal   Collection Time: 10/05/23  7:18 PM  Result Value Ref Range   hCG, Beta Chain, Quant, S 1,900 (H) <5 mIU/mL  Wet prep, genital     Status: Abnormal   Collection Time: 10/05/23  7:53 PM  Result Value Ref Range   Yeast Wet Prep HPF POC NONE SEEN NONE SEEN   Trich, Wet Prep NONE SEEN NONE SEEN   Clue Cells Wet Prep HPF POC NONE SEEN NONE SEEN   WBC, Wet Prep HPF POC >=10 (A) <10   Sperm NONE SEEN     MDM: MAU Course: -Vital signs within normal limits.  -CBC, hCG, US  for possible SAB. -Wet prep and GC to rule out infectious causes of vaginal bleeding. -Wet prep negative. CBC with anemia. hCG 1900. US  without  gestational sac, yolk sac, or fetal pole. Endometrium 15 mm. -Consulted Dr. Ozan. Recommends repeat hCG in 1 week, office follow up in 2 weeks. Can offer Cytotec 400 mcg x1 to facilitate passage of POC.  A 1. Spontaneous abortion (Primary) - Discharge patient  2. Iron deficiency anemia, unspecified iron deficiency anemia type - Discharge patient  3. Mastitis, right, acute - Discharge patient   Medical screening exam complete  P Discharge from MAU in stable condition with return precautions: pain, heavy bleeding, malodorous discharge, fever/chills, dizziness. Follow up at [facility] Discussed expectant management vs Cytotec 400 mcg x1 dose. Patient prefers expectant management. Repeat hCG in 1 week. Office visit follow up in 2 weeks. Start multivitamin or iron supplement for IDA. Cefadroxil  for mastitis.  No future appointments. Allergies as of 10/05/2023   No Known Allergies      Medication List     TAKE these medications    acetaminophen  325 MG tablet Commonly known as: TYLENOL  Take 650 mg by mouth every 6 (six) hours as needed.    Blood Pressure Kit Devi 1 Device by Does not apply route once a week.   cefadroxil  500 MG capsule Commonly known as: DURICEF Take 2 capsules (1,000 mg total) by mouth daily.   ibuprofen  800 MG tablet Commonly known as: ADVIL  Take 1 tablet (800 mg total) by mouth 3 (three) times daily.   Prenatal 28-0.8 MG Tabs Take 1 tablet by mouth daily.        Noreene Bearded, PA

## 2023-10-05 NOTE — MAU Note (Signed)
 Danielle Bradley is a 28 y.o. at [redacted]w[redacted]d here in MAU reporting: preg was confirmed at [facility].  On first US , they saw a sac, but nothing was in it.  Thought dates could be off.  Missed f/u appt. 5/19 started bleeding.  Was heavy, soaking pads and passing clots.  Was like that a few days, then started getting lighter. Called to reschedule , told them what was going on and was told to come here.  Now only using one pad a day, just spotting. Is no longer having the abd cramping. Also today around 100, started getting a burning pain in her rt breast "like a 10/10"". Still hurting that bad.   Onset of complaint: bleeding since 5/19 Pain score: breast 10 Vitals:   10/05/23 1839  BP: 132/87  Pulse: 75  Resp: 16  Temp: 98.6 F (37 C)  SpO2: 100%      Lab orders placed from triage:

## 2023-10-05 NOTE — Discharge Instructions (Addendum)
 I am so sorry that you are going through this. It is important to remember that you did not do anything to cause this to happen.  There are three options for managing a miscarriage: 1) Expectant Management: This is where you wait for 2 weeks to see if the body passes the pregnancy on its own 2) Cytotec (Misoprostol): this is a medication that causes cramping of the uterus and can help passage of the pregnancy.  3) Dilation and curettage: this is a surgical procedure to remove the pregnancy from the uterus  You have decided to do   EXPECTANT MANAGEMENT    We will recheck your blood work in about 1 week. We will see you in the office in 2 weeks to confirm passage of the pregnancy or to help you with passage of your pregnancy. The office will call you to schedule these appointments.  You were given a prescription for ibuprofen  for pain, and an antibiotic called cefadroxil for mastitis. If you decide you would like to have Cytotec please call the office and we will send this to the pharmacy for you.   Reasons to return to the MAU: If you have pain that does not get better with pain medication Bleeding that soaks through a thick full-sized sanitary pad in 1 hour Cramps that last longer than 2 days Foul smelling discharge Fever above 100.4 degrees F You feel dizzy or have chills.  START a multivitamin daily or an iron supplement every other day.

## 2023-10-06 LAB — GC/CHLAMYDIA PROBE AMP (~~LOC~~) NOT AT ARMC
Chlamydia: NEGATIVE
Comment: NEGATIVE
Comment: NORMAL
Neisseria Gonorrhea: NEGATIVE

## 2024-01-24 DIAGNOSIS — H5213 Myopia, bilateral: Secondary | ICD-10-CM | POA: Diagnosis not present

## 2024-01-30 ENCOUNTER — Encounter: Payer: Self-pay | Admitting: Obstetrics

## 2024-01-30 ENCOUNTER — Ambulatory Visit: Admitting: Obstetrics

## 2024-01-30 VITALS — BP 114/74 | HR 83 | Ht 60.0 in | Wt 99.0 lb

## 2024-01-30 DIAGNOSIS — Z3009 Encounter for other general counseling and advice on contraception: Secondary | ICD-10-CM | POA: Diagnosis not present

## 2024-01-30 DIAGNOSIS — N946 Dysmenorrhea, unspecified: Secondary | ICD-10-CM

## 2024-01-30 DIAGNOSIS — Z30011 Encounter for initial prescription of contraceptive pills: Secondary | ICD-10-CM

## 2024-01-30 DIAGNOSIS — E569 Vitamin deficiency, unspecified: Secondary | ICD-10-CM | POA: Diagnosis not present

## 2024-01-30 LAB — POCT URINE PREGNANCY: Preg Test, Ur: NEGATIVE

## 2024-01-30 MED ORDER — VITAFOL ULTRA 29-0.6-0.4-200 MG PO CAPS: 1.0000 | ORAL_CAPSULE | Freq: Every day | ORAL | 4 refills | Status: DC

## 2024-01-30 MED ORDER — LO LOESTRIN FE 1 MG-10 MCG / 10 MCG PO TABS
1.0000 | ORAL_TABLET | Freq: Every day | ORAL | 11 refills | Status: DC
Start: 2024-01-30 — End: 2024-03-29

## 2024-01-30 MED ORDER — IBUPROFEN 800 MG PO TABS: 800.0000 mg | ORAL_TABLET | Freq: Three times a day (TID) | ORAL | 5 refills | Status: DC | PRN

## 2024-01-30 NOTE — Progress Notes (Signed)
 Pt presents for bc. Pt wants the pill for bc. Pt has no questions or concerns at this time.

## 2024-01-30 NOTE — Progress Notes (Signed)
 Subjective:    Danielle Bradley is a 28 y.o. female who presents for contraception counseling. The patient has no complaints today. The patient is sexually active. Pertinent past medical history: none.  The information documented in the HPI was reviewed and verified.  Menstrual History: OB History     Gravida  3   Para  2   Term  2   Preterm  0   AB  0   Living  2      SAB  0   IAB  0   Ectopic  0   Multiple  0   Live Births  2            Patient's last menstrual period was 06/27/2023 (approximate).   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   Past Medical History:  Diagnosis Date   Neurofibromatosis, type 1 (HCC)     Past Surgical History:  Procedure Laterality Date   NO PAST SURGERIES     WISDOM TOOTH EXTRACTION       Current Outpatient Medications:    ibuprofen  (ADVIL ) 800 MG tablet, Take 1 tablet (800 mg total) by mouth every 8 (eight) hours as needed., Disp: 30 tablet, Rfl: 5   LO LOESTRIN FE  1 MG-10 MCG / 10 MCG tablet, Take 1 tablet by mouth daily., Disp: 28 tablet, Rfl: 11   Prenat-Fe Poly-Methfol-FA-DHA (VITAFOL  ULTRA) 29-0.6-0.4-200 MG CAPS, Take 1 capsule by mouth daily before breakfast., Disp: 90 capsule, Rfl: 4   acetaminophen  (TYLENOL ) 325 MG tablet, Take 650 mg by mouth every 6 (six) hours as needed. (Patient not taking: Reported on 01/30/2024), Disp: , Rfl:    Blood Pressure Monitoring (BLOOD PRESSURE KIT) DEVI, 1 Device by Does not apply route once a week. (Patient not taking: Reported on 01/30/2024), Disp: 1 each, Rfl: 0   cefadroxil  (DURICEF) 500 MG capsule, Take 2 capsules (1,000 mg total) by mouth daily. (Patient not taking: Reported on 01/30/2024), Disp: 14 capsule, Rfl: 0   ibuprofen  (ADVIL ) 800 MG tablet, Take 1 tablet (800 mg total) by mouth 3 (three) times daily. (Patient not taking:  Reported on 01/30/2024), Disp: 21 tablet, Rfl: 0 No Known Allergies  Social History   Tobacco Use   Smoking status: Never   Smokeless tobacco: Never  Substance Use Topics   Alcohol use: Not Currently    Family History  Problem Relation Age of Onset   Neurofibromatosis Mother    Healthy Father    Neurofibromatosis Sister    Neurofibromatosis Maternal Grandmother        Review of Systems Constitutional: negative for weight loss Genitourinary:negative for abnormal menstrual periods and vaginal discharge   Objective:   BP 114/74   Pulse 83   Ht 5' (1.524 m)   Wt 99 lb (44.9 kg)   LMP 06/27/2023 (Approximate)   Breastfeeding Unknown   BMI 19.33 kg/m    General:   Alert and no distress  Skin:   no rash or abnormalities  Lungs:   clear to auscultation bilaterally  Heart:   regular rate and rhythm, S1, S2 normal, no murmur, click, rub or gallop  The remainder of the physical exam deferred due to the type of encounter.   Lab Review Urine pregnancy test Labs reviewed yes Radiologic studies reviewed no  I have spent a total of 15 minutes of face-to-face time, excluding clinical staff time,  reviewing notes and preparing to see patient, ordering tests and/or medications, and counseling the patient.   Assessment:    28 y.o., starting OCP (estrogen/progesterone), no contraindications.   Plan:   1. Encounter for other general counseling and advice on contraception (Primary) - OPTIONS DISCUSSED.  WANTS OCP'S.  2. Encounter for initial prescription of contraceptive pills Rx: - POCT urine pregnancy - LO LOESTRIN FE  1 MG-10 MCG / 10 MCG tablet; Take 1 tablet by mouth daily.  Dispense: 28 tablet; Refill: 11  3. Dysmenorrhea Rx: - ibuprofen  (ADVIL ) 800 MG tablet; Take 1 tablet (800 mg total) by mouth every 8 (eight) hours as needed.  Dispense: 30 tablet; Refill: 5  4. Vitamin deficiency Rx: - Prenat-Fe Poly-Methfol-FA-DHA (VITAFOL  ULTRA) 29-0.6-0.4-200 MG CAPS; Take 1  capsule by mouth daily before breakfast.  Dispense: 90 capsule; Refill: 4    All questions answered. Contraception: OCP (estrogen/progesterone). Discussed healthy lifestyle modifications. Follow up in 2 months. Pregnancy test, result: negative.   Meds ordered this encounter  Medications   LO LOESTRIN FE  1 MG-10 MCG / 10 MCG tablet    Sig: Take 1 tablet by mouth daily.    Dispense:  28 tablet    Refill:  11    Submit other coverage code 3  BIN:  [id number]  PCN:  [id number]   GRP:  [id number]   ID:  [id number]   Prenat-Fe Poly-Methfol-FA-DHA (VITAFOL  ULTRA) 29-0.6-0.4-200 MG CAPS    Sig: Take 1 capsule by mouth daily before breakfast.    Dispense:  90 capsule    Refill:  4   ibuprofen  (ADVIL ) 800 MG tablet    Sig: Take 1 tablet (800 mg total) by mouth every 8 (eight) hours as needed.    Dispense:  30 tablet    Refill:  5   Orders Placed This Encounter  Procedures   POCT urine pregnancy    CARLIN RONAL CENTERS, MD, FACOG Attending Obstetrician & Gynecologist, [facility] for [facility], [facility], [place] 01/30/2024

## 2024-03-29 ENCOUNTER — Encounter: Payer: Self-pay | Admitting: Physician Assistant

## 2024-03-29 ENCOUNTER — Other Ambulatory Visit (HOSPITAL_COMMUNITY)
Admission: RE | Admit: 2024-03-29 | Discharge: 2024-03-29 | Disposition: A | Discharge status: Home / Self Care | Source: Ambulatory Visit | Attending: Physician Assistant | Admitting: Physician Assistant

## 2024-03-29 ENCOUNTER — Ambulatory Visit: Admitting: Physician Assistant

## 2024-03-29 VITALS — BP 132/82 | HR 81 | Ht 60.0 in | Wt 99.2 lb

## 2024-03-29 DIAGNOSIS — Z124 Encounter for screening for malignant neoplasm of cervix: Secondary | ICD-10-CM

## 2024-03-29 DIAGNOSIS — Z01419 Encounter for gynecological examination (general) (routine) without abnormal findings: Secondary | ICD-10-CM

## 2024-03-29 DIAGNOSIS — Z30011 Encounter for initial prescription of contraceptive pills: Secondary | ICD-10-CM

## 2024-03-29 MED ORDER — NORETHIN ACE-ETH ESTRAD-FE 1-20 MG-MCG(24) PO TABS: 1.0000 | ORAL_TABLET | Freq: Every day | ORAL | 3 refills | Status: AC

## 2024-03-29 NOTE — Progress Notes (Signed)
 ANNUAL EXAM Patient name: Danielle Bradley MRN [id number]  Date of birth: 11-16-1995 Chief Complaint:   Annual Exam  History of Present Illness:   Danielle Bradley is a 28 y.o. G43P2002 female being seen today for a routine annual exam.   Current complaints: None  Patient's last menstrual period was 03/03/2024 (exact date).   The pregnancy intention screening data noted above was reviewed. Potential methods of contraception were discussed. The patient elected to proceed with No data recorded.   Last pap 02/08/23. Results were: NILM. H/O abnormal pap: no Last mammogram: Never done due to age. Results were: N/A. Family h/o breast cancer: no Last colonoscopy: Never done due to age. Results were: N/A. Family h/o colorectal cancer: possibly maternal grandfather. STI screening: Accepts Contraception: OCPs     09/20/2023    8:56 AM 09/15/2023   11:00 AM 02/08/2023   10:55 AM  Depression screen PHQ 2/9  Decreased Interest 1 0 0  Down, Depressed, Hopeless 1 0 0  PHQ - 2 Score 2 0 0  Altered sleeping 0 0   Tired, decreased energy 1 0   Change in appetite 2 0   Feeling bad or failure about yourself  0 0   Trouble concentrating 0 0   Moving slowly or fidgety/restless 0 0   Suicidal thoughts 0 0   PHQ-9 Score 5  0       Data saved with a previous flowsheet row definition        09/20/2023    8:58 AM 09/15/2023   11:01 AM 02/08/2023   10:55 AM  GAD 7 : Generalized Anxiety Score  Nervous, Anxious, on Edge 1 0 0  Control/stop worrying 0 0 0  Worry too much - different things 0 0 0  Trouble relaxing 1 0 0  Restless 0 0 0  Easily annoyed or irritable 2 1 0  Afraid - awful might happen 0 0 0  Total GAD 7 Score 4 1 0     Review of Systems:   Pertinent items are noted in HPI Denies any headaches, blurred vision, fatigue, shortness of breath, chest pain, abdominal pain, abnormal vaginal discharge/itching/odor/irritation, problems with periods, bowel movements, urination, or intercourse  unless otherwise stated above. Pertinent History Reviewed:  Reviewed past medical,surgical, social and family history.  Reviewed problem list, medications and allergies. Physical Assessment:   Vitals:   03/29/24 1053  BP: 132/82  Pulse: 81  Weight: 99 lb 3.2 oz (45 kg)  Height: 5' (1.524 m)  Body mass index is 19.37 kg/m.        Physical Examination:   General appearance - well appearing, and in no distress  Mental status - alert, oriented to person, place, and time  Psych:  She has a normal mood and affect  Skin - warm and dry, normal color, no suspicious lesions noted  Chest - effort normal, all lung fields clear to auscultation bilaterally  Heart - normal rate and regular rhythm  Neck:  midline trachea, no thyromegaly or nodules  Breasts - breasts appear normal, no suspicious masses, no skin or nipple changes or axillary nodes  Abdomen - soft, nontender, nondistended, no masses or organomegaly  Pelvic - VULVA: normal appearing vulva with no masses, tenderness or lesions  VAGINA: normal appearing vagina with normal color and discharge, no lesions  CERVIX: normal appearing cervix without discharge or lesions, no CMT  UTERUS: uterus is felt to be normal size, shape, consistency and nontender   ADNEXA: No adnexal masses  or tenderness noted.  Extremities:  No swelling or varicosities noted  Chaperone present for exam  No results found for this or any previous visit (from the past 24 hours).  Assessment & Plan:   1. Encounter for annual routine gynecological examination (Primary) 2. Cervical cancer screening - Cervical cancer screening: Discussed screening Q3 years. Reviewed importance of annual exams and limits of pap smear. Pap with reflex HPV due 2027 - GC/CT: Discussed and recommended. Pt  accepts - Birth Control: OCPs - Breast Health: Encouraged self breast awareness/exams. Teaching provided. - Mammogram: @ 29yo, or sooner if problems - Colonoscopy: @ 29yo, or sooner if  problems - Follow-up: 12 months and prn  - Cervicovaginal ancillary only( [place]) - HIV antibody (with reflex) - RPR - Hepatitis C Antibody - Hepatitis B Surface AntiGEN   3. Encounter for initial prescription of contraceptive pills - Norethindrone Acetate-Ethinyl Estrad-FE (LOESTRIN  24 FE) 1-20 MG-MCG(24) tablet; Take 1 tablet by mouth daily.  Dispense: 90 tablet; Refill: 3   Orders Placed This Encounter  Procedures   HIV antibody (with reflex)   RPR   Hepatitis C Antibody   Hepatitis B Surface AntiGEN    Meds:  Meds ordered this encounter  Medications   Norethindrone Acetate-Ethinyl Estrad-FE (LOESTRIN  24 FE) 1-20 MG-MCG(24) tablet    Sig: Take 1 tablet by mouth daily.    Dispense:  90 tablet    Refill:  3    Follow-up: No follow-ups on file.  Aslee Such E Kahliya Fraleigh, PA-C 03/29/2024 11:57 AM

## 2024-03-29 NOTE — Progress Notes (Signed)
 Pt presents for annual. Pt wants all std testing.

## 2024-03-30 LAB — HEPATITIS C ANTIBODY: Hep C Virus Ab: NONREACTIVE

## 2024-03-30 LAB — CERVICOVAGINAL ANCILLARY ONLY
Bacterial Vaginitis (gardnerella): NEGATIVE
Candida Glabrata: NEGATIVE
Candida Vaginitis: NEGATIVE
Chlamydia: NEGATIVE
Comment: NEGATIVE
Comment: NEGATIVE
Comment: NEGATIVE
Comment: NEGATIVE
Comment: NEGATIVE
Comment: NORMAL
Neisseria Gonorrhea: NEGATIVE
Trichomonas: NEGATIVE

## 2024-03-30 LAB — HIV ANTIBODY (ROUTINE TESTING W REFLEX): HIV Screen 4th Generation wRfx: NONREACTIVE

## 2024-03-30 LAB — HEPATITIS B SURFACE ANTIGEN: Hepatitis B Surface Ag: NEGATIVE

## 2024-03-30 LAB — SYPHILIS: RPR W/REFLEX TO RPR TITER AND TREPONEMAL ANTIBODIES, TRADITIONAL SCREENING AND DIAGNOSIS ALGORITHM: RPR Ser Ql: NONREACTIVE

## 2024-04-01 ENCOUNTER — Ambulatory Visit: Payer: Self-pay | Admitting: Physician Assistant

## 2024-04-26 ENCOUNTER — Encounter (HOSPITAL_COMMUNITY): Payer: Self-pay

## 2024-04-26 ENCOUNTER — Ambulatory Visit (HOSPITAL_COMMUNITY): Admission: EM | Admit: 2024-04-26 | Discharge: 2024-04-26 | Disposition: A | Discharge status: Home / Self Care

## 2024-04-26 DIAGNOSIS — N898 Other specified noninflammatory disorders of vagina: Secondary | ICD-10-CM | POA: Insufficient documentation

## 2024-04-26 DIAGNOSIS — R350 Frequency of micturition: Secondary | ICD-10-CM | POA: Insufficient documentation

## 2024-04-26 DIAGNOSIS — Z3202 Encounter for pregnancy test, result negative: Secondary | ICD-10-CM | POA: Diagnosis not present

## 2024-04-26 LAB — POCT URINE DIPSTICK
Bilirubin, UA: NEGATIVE
Blood, UA: NEGATIVE
Glucose, UA: NEGATIVE mg/dL
Ketones, POC UA: NEGATIVE mg/dL
Leukocytes, UA: NEGATIVE
Nitrite, UA: NEGATIVE
POC PROTEIN,UA: NEGATIVE
Spec Grav, UA: 1.015 (ref 1.010–1.025)
Urobilinogen, UA: 0.2 U/dL
pH, UA: 7 (ref 5.0–8.0)

## 2024-04-26 LAB — POCT URINE PREGNANCY: Preg Test, Ur: NEGATIVE

## 2024-04-26 NOTE — Discharge Instructions (Addendum)
 Your urinalysis and pregnancy test are negative.    STD testing pending, this will take 2-3 days to result. We will only call you if your testing is positive for any infection(s) and we will provide treatment.  Avoid sexual intercourse until your STD results come back.  If any of your STD results are positive, you will need to avoid sexual intercourse for 7 days while you are being treated to prevent spread of STD.  Condom use is the best way to prevent spread of STDs. Notify partner(s) of any positive results.  Return to urgent care as needed.

## 2024-04-26 NOTE — ED Provider Notes (Signed)
 [facility]    CSN: [id number] Arrival date & time: 04/26/24  9195      History   Chief Complaint Chief Complaint  Patient presents with   Vaginal Itching    HPI Danielle Bradley is a 28 y.o. female.   This 28 year old female is being seen for complaints of urinary frequency and vaginal itching for several days.  She reports she recently had a change in oral contraceptive pills.  She says since that time she has had some vaginal spotting, but has not had a menstrual cycle.  Home pregnancy tests have been negative up to this point.  She reports lower abdominal cramping and low back pain.  She reports a long history of intermittent shortness of breath, has been seen several times and diagnosed with costochondritis.  She denies headache, dizziness.  She denies chest pain.  She denies nausea, vomiting, diarrhea.   Vaginal Itching Associated symptoms include abdominal pain (lower cramping) and shortness of breath. Pertinent negatives include no chest pain and no headaches.    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 Authorizing Provider  Norethindrone Acetate-Ethinyl Estrad-FE (LOESTRIN  24 FE) 1-20 MG-MCG(24) tablet Take 1 tablet by mouth daily. 03/29/24  Yes Nicholaus Jorene BRAVO, PA-C    Family History Family History  Problem Relation Age of Onset    Neurofibromatosis Mother    Healthy Father    Neurofibromatosis Sister    Neurofibromatosis Maternal Grandmother     Social History Social History[1]   Allergies   Patient has no known allergies.   Review of Systems Review of Systems  Constitutional:  Negative for activity change, appetite change, chills and fever.  Respiratory:  Positive for shortness of breath. Negative for cough.   Cardiovascular:  Negative for chest pain.  Gastrointestinal:  Positive for abdominal pain (lower cramping). Negative for constipation, diarrhea, nausea and vomiting.  Genitourinary:  Positive for frequency, menstrual problem and vaginal discharge. Negative for difficulty urinating, dysuria, genital sores, pelvic pain, urgency, vaginal bleeding and vaginal pain.  Skin:  Negative for color change and rash.  Neurological:  Negative for dizziness and headaches.  All other systems reviewed and are negative.    Physical Exam Triage Vital Signs ED Triage Vitals  Encounter Vitals Group     BP 04/26/24 0828 (!) 134/92     Girls Systolic BP Percentile --      Girls Diastolic BP Percentile --      Boys Systolic BP Percentile --      Boys Diastolic BP Percentile --      Pulse Rate 04/26/24 0828 78  Resp 04/26/24 0828 16     Temp 04/26/24 0828 98.4 F (36.9 C)     Temp Source 04/26/24 0828 Oral     SpO2 04/26/24 0828 99 %     Weight 04/26/24 0827 97 lb (44 kg)     Height 04/26/24 0827 5' (1.524 m)     Head Circumference --      Peak Flow --      Pain Score 04/26/24 0827 0     Pain Loc --      Pain Education --      Exclude from Growth Chart --    No data found.  Updated Vital Signs BP (!) 134/92 (BP Location: Left Arm)   Pulse 78   Temp 98.4 F (36.9 C) (Oral)   Resp 16   Ht 5' (1.524 m)   Wt 97 lb (44 kg)   LMP 03/03/2024 (Exact Date)   SpO2 99%   Breastfeeding No   BMI 18.94 kg/m   Visual Acuity Right Eye Distance:   Left Eye Distance:   Bilateral Distance:    Right Eye  Near:   Left Eye Near:    Bilateral Near:     Physical Exam Vitals and nursing note reviewed.  Constitutional:      General: She is awake. She is not in acute distress.    Appearance: She is well-developed. She is not ill-appearing or toxic-appearing.     Comments: Pleasant female appearing stated age found sitting in chair in no acute distress.  HENT:     Head: Normocephalic and atraumatic.  Eyes:     Conjunctiva/sclera: Conjunctivae normal.  Cardiovascular:     Rate and Rhythm: Normal rate and regular rhythm.     Heart sounds: Normal heart sounds. No murmur heard. Pulmonary:     Effort: Pulmonary effort is normal. No respiratory distress.     Breath sounds: Normal breath sounds.  Abdominal:     General: Bowel sounds are normal.     Palpations: Abdomen is soft.     Tenderness: There is no abdominal tenderness.  Skin:    General: Skin is warm and dry.  Neurological:     Mental Status: She is alert.  Psychiatric:        Mood and Affect: Mood normal.        Behavior: Behavior is cooperative.      UC Treatments / Results  Labs (all labs ordered are listed, but only abnormal results are displayed) Labs Reviewed  POCT URINE DIPSTICK  POCT URINE PREGNANCY  CERVICOVAGINAL ANCILLARY ONLY    EKG   Radiology No results found.  Procedures Procedures (including critical care time)  Medications Ordered in UC Medications - No data to display  Initial Impression / Assessment and Plan / UC Course  I have reviewed the triage vital signs and the nursing notes.  Pertinent labs & imaging results that were available during my care of the patient were reviewed by me and considered in my medical decision making (see chart for details).     Vitals and triage reviewed, patient is hemodynamically stable.  No signs of respiratory distress.  No hypoxia.  UA collected and is negative.  Urine pregnancy negative.  Cervicovaginal swab collected due to complaint of vaginal discharge.   HIV and syphilis screening declined at this time.  Staff will contact if results are positive to initiate appropriate treatment.  Plan of care, follow-up care, return precautions given, no questions at this time. Final Clinical Impressions(s) /  UC Diagnoses   Final diagnoses:  Vaginal discharge  Urinary frequency     Discharge Instructions      STD testing pending, this will take 2-3 days to result. We will only call you if your testing is positive for any infection(s) and we will provide treatment.  Avoid sexual intercourse until your STD results come back.  If any of your STD results are positive, you will need to avoid sexual intercourse for 7 days while you are being treated to prevent spread of STD.  Condom use is the best way to prevent spread of STDs. Notify partner(s) of any positive results.  Return to urgent care as needed.       ED Prescriptions   None    PDMP not reviewed this encounter.    [1]  Social History Tobacco Use   Smoking status: Never   Smokeless tobacco: Never  Vaping Use   Vaping status: Never Used  Substance Use Topics   Alcohol use: Not Currently   Drug use: No     Lennice Jon BROCKS, FNP 04/26/24 [phone]

## 2024-04-26 NOTE — ED Triage Notes (Signed)
 Presenting with vaginal itching, abdominal cramping, and mild low back pain. Denies any other urinary symptoms. Patient was recently switching to a new pill birth control. States she had been spotting but now her period is 20 days late. Home pregnancy testing has been negative.

## 2024-04-27 LAB — CERVICOVAGINAL ANCILLARY ONLY
Bacterial Vaginitis (gardnerella): NEGATIVE
Candida Glabrata: NEGATIVE
Candida Vaginitis: NEGATIVE
Chlamydia: NEGATIVE
Comment: NEGATIVE
Comment: NEGATIVE
Comment: NEGATIVE
Comment: NEGATIVE
Comment: NEGATIVE
Comment: NORMAL
Neisseria Gonorrhea: NEGATIVE
Trichomonas: NEGATIVE

## 2024-05-24 ENCOUNTER — Ambulatory Visit: Admitting: Family Medicine

## 2024-05-28 ENCOUNTER — Ambulatory Visit: Admitting: Family Medicine
